# Patient Record
Sex: Female | Born: 1970 | Race: Black or African American | Hispanic: No | Smoking: Former smoker
Health system: Southern US, Community
[De-identification: ages and names within clinical notes are randomized; demographics above are authoritative.]

## PROBLEM LIST (undated history)

## (undated) DIAGNOSIS — E0590 Thyrotoxicosis, unspecified without thyrotoxic crisis or storm: Secondary | ICD-10-CM

## (undated) DIAGNOSIS — T8859XA Other complications of anesthesia, initial encounter: Secondary | ICD-10-CM

## (undated) DIAGNOSIS — T7840XA Allergy, unspecified, initial encounter: Secondary | ICD-10-CM

## (undated) DIAGNOSIS — E119 Type 2 diabetes mellitus without complications: Secondary | ICD-10-CM

## (undated) DIAGNOSIS — I10 Essential (primary) hypertension: Secondary | ICD-10-CM

## (undated) DIAGNOSIS — E669 Obesity, unspecified: Secondary | ICD-10-CM

## (undated) HISTORY — PX: TUBAL LIGATION: SHX77

## (undated) HISTORY — DX: Allergy, unspecified, initial encounter: T78.40XA

## (undated) HISTORY — PX: DILATION AND CURETTAGE OF UTERUS: SHX78

---

## 2010-11-19 ENCOUNTER — Emergency Department (HOSPITAL_COMMUNITY)
Admission: EM | Admit: 2010-11-19 | Discharge: 2010-11-19 | Disposition: A | Discharge status: Home / Self Care | Payer: Self-pay | Attending: Emergency Medicine | Admitting: Emergency Medicine

## 2010-11-19 DIAGNOSIS — E119 Type 2 diabetes mellitus without complications: Secondary | ICD-10-CM | POA: Insufficient documentation

## 2010-11-19 DIAGNOSIS — J45909 Unspecified asthma, uncomplicated: Secondary | ICD-10-CM | POA: Insufficient documentation

## 2010-11-19 DIAGNOSIS — I10 Essential (primary) hypertension: Secondary | ICD-10-CM | POA: Insufficient documentation

## 2010-11-19 DIAGNOSIS — H109 Unspecified conjunctivitis: Secondary | ICD-10-CM | POA: Insufficient documentation

## 2010-11-19 DIAGNOSIS — L2989 Other pruritus: Secondary | ICD-10-CM | POA: Insufficient documentation

## 2010-11-19 DIAGNOSIS — L298 Other pruritus: Secondary | ICD-10-CM | POA: Insufficient documentation

## 2010-11-19 DIAGNOSIS — H5789 Other specified disorders of eye and adnexa: Secondary | ICD-10-CM | POA: Insufficient documentation

## 2010-11-19 LAB — GLUCOSE, CAPILLARY: Glucose-Capillary: 93 mg/dL (ref 70–99)

## 2011-03-14 ENCOUNTER — Emergency Department (HOSPITAL_COMMUNITY)
Admission: EM | Admit: 2011-03-14 | Discharge: 2011-03-14 | Disposition: A | Discharge status: Home / Self Care | Payer: Self-pay | Attending: Emergency Medicine | Admitting: Emergency Medicine

## 2011-03-14 DIAGNOSIS — J029 Acute pharyngitis, unspecified: Secondary | ICD-10-CM | POA: Insufficient documentation

## 2011-03-14 DIAGNOSIS — R509 Fever, unspecified: Secondary | ICD-10-CM | POA: Insufficient documentation

## 2011-03-14 DIAGNOSIS — I10 Essential (primary) hypertension: Secondary | ICD-10-CM | POA: Insufficient documentation

## 2011-03-14 DIAGNOSIS — E119 Type 2 diabetes mellitus without complications: Secondary | ICD-10-CM | POA: Insufficient documentation

## 2011-03-14 DIAGNOSIS — J040 Acute laryngitis: Secondary | ICD-10-CM | POA: Insufficient documentation

## 2011-03-14 DIAGNOSIS — J45909 Unspecified asthma, uncomplicated: Secondary | ICD-10-CM | POA: Insufficient documentation

## 2011-03-14 DIAGNOSIS — R059 Cough, unspecified: Secondary | ICD-10-CM | POA: Insufficient documentation

## 2011-03-14 DIAGNOSIS — R05 Cough: Secondary | ICD-10-CM | POA: Insufficient documentation

## 2011-03-14 LAB — RAPID STREP SCREEN (MED CTR MEBANE ONLY): Streptococcus, Group A Screen (Direct): NEGATIVE

## 2011-03-18 ENCOUNTER — Emergency Department (HOSPITAL_COMMUNITY): Payer: Self-pay

## 2011-03-18 ENCOUNTER — Emergency Department (HOSPITAL_COMMUNITY)
Admission: EM | Admit: 2011-03-18 | Discharge: 2011-03-18 | Disposition: A | Discharge status: Home / Self Care | Payer: Self-pay | Attending: Emergency Medicine | Admitting: Emergency Medicine

## 2011-03-18 DIAGNOSIS — R599 Enlarged lymph nodes, unspecified: Secondary | ICD-10-CM | POA: Insufficient documentation

## 2011-03-18 DIAGNOSIS — R059 Cough, unspecified: Secondary | ICD-10-CM | POA: Insufficient documentation

## 2011-03-18 DIAGNOSIS — I10 Essential (primary) hypertension: Secondary | ICD-10-CM | POA: Insufficient documentation

## 2011-03-18 DIAGNOSIS — R1110 Vomiting, unspecified: Secondary | ICD-10-CM | POA: Insufficient documentation

## 2011-03-18 DIAGNOSIS — R5381 Other malaise: Secondary | ICD-10-CM | POA: Insufficient documentation

## 2011-03-18 DIAGNOSIS — J40 Bronchitis, not specified as acute or chronic: Secondary | ICD-10-CM | POA: Insufficient documentation

## 2011-03-18 DIAGNOSIS — Z79899 Other long term (current) drug therapy: Secondary | ICD-10-CM | POA: Insufficient documentation

## 2011-03-18 DIAGNOSIS — J029 Acute pharyngitis, unspecified: Secondary | ICD-10-CM | POA: Insufficient documentation

## 2011-03-18 DIAGNOSIS — R0989 Other specified symptoms and signs involving the circulatory and respiratory systems: Secondary | ICD-10-CM | POA: Insufficient documentation

## 2011-03-18 DIAGNOSIS — R0609 Other forms of dyspnea: Secondary | ICD-10-CM | POA: Insufficient documentation

## 2011-03-18 DIAGNOSIS — IMO0001 Reserved for inherently not codable concepts without codable children: Secondary | ICD-10-CM | POA: Insufficient documentation

## 2011-03-18 DIAGNOSIS — J351 Hypertrophy of tonsils: Secondary | ICD-10-CM | POA: Insufficient documentation

## 2011-03-18 DIAGNOSIS — R0789 Other chest pain: Secondary | ICD-10-CM | POA: Insufficient documentation

## 2011-03-18 DIAGNOSIS — R05 Cough: Secondary | ICD-10-CM | POA: Insufficient documentation

## 2011-03-18 DIAGNOSIS — J45909 Unspecified asthma, uncomplicated: Secondary | ICD-10-CM | POA: Insufficient documentation

## 2011-03-18 DIAGNOSIS — E119 Type 2 diabetes mellitus without complications: Secondary | ICD-10-CM | POA: Insufficient documentation

## 2011-03-18 DIAGNOSIS — R509 Fever, unspecified: Secondary | ICD-10-CM | POA: Insufficient documentation

## 2011-03-18 LAB — GLUCOSE, CAPILLARY: Glucose-Capillary: 111 mg/dL — ABNORMAL HIGH (ref 70–99)

## 2011-09-18 ENCOUNTER — Emergency Department (HOSPITAL_COMMUNITY): Payer: BC Managed Care – PPO

## 2011-09-18 ENCOUNTER — Emergency Department (HOSPITAL_COMMUNITY)
Admission: EM | Admit: 2011-09-18 | Discharge: 2011-09-19 | Disposition: A | Discharge status: Home / Self Care | Payer: BC Managed Care – PPO | Attending: Emergency Medicine | Admitting: Emergency Medicine

## 2011-09-18 ENCOUNTER — Other Ambulatory Visit: Payer: Self-pay

## 2011-09-18 ENCOUNTER — Encounter (HOSPITAL_COMMUNITY): Payer: Self-pay | Admitting: Emergency Medicine

## 2011-09-18 DIAGNOSIS — I10 Essential (primary) hypertension: Secondary | ICD-10-CM | POA: Insufficient documentation

## 2011-09-18 DIAGNOSIS — R5381 Other malaise: Secondary | ICD-10-CM | POA: Insufficient documentation

## 2011-09-18 DIAGNOSIS — E669 Obesity, unspecified: Secondary | ICD-10-CM | POA: Insufficient documentation

## 2011-09-18 DIAGNOSIS — R079 Chest pain, unspecified: Secondary | ICD-10-CM | POA: Insufficient documentation

## 2011-09-18 DIAGNOSIS — R110 Nausea: Secondary | ICD-10-CM | POA: Insufficient documentation

## 2011-09-18 DIAGNOSIS — J45909 Unspecified asthma, uncomplicated: Secondary | ICD-10-CM | POA: Insufficient documentation

## 2011-09-18 DIAGNOSIS — R1011 Right upper quadrant pain: Secondary | ICD-10-CM

## 2011-09-18 HISTORY — DX: Obesity, unspecified: E66.9

## 2011-09-18 HISTORY — DX: Essential (primary) hypertension: I10

## 2011-09-18 LAB — BASIC METABOLIC PANEL
CO2: 29 mEq/L (ref 19–32)
Calcium: 9.2 mg/dL (ref 8.4–10.5)
Creatinine, Ser: 0.82 mg/dL (ref 0.50–1.10)
GFR calc Af Amer: >90 mL/min (ref >90)
GFR calc non Af Amer: 90 mL/min — ABNORMAL LOW (ref >90)

## 2011-09-18 LAB — CBC
HCT: 40.7 % (ref 36.0–46.0)
MCH: 30.2 pg (ref 26.0–34.0)
MCHC: 34.2 g/dL (ref 30.0–36.0)
MCV: 88.5 fL (ref 78.0–100.0)
Platelets: 223 10*3/uL (ref 150–400)
RDW: 13.2 % (ref 11.5–15.5)

## 2011-09-18 LAB — HEPATIC FUNCTION PANEL
Albumin: 3.5 g/dL (ref 3.5–5.2)
Alkaline Phosphatase: 72 U/L (ref 39–117)
Bilirubin, Direct: 0.1 mg/dL (ref 0.0–0.3)
Total Bilirubin: 0.5 mg/dL (ref 0.3–1.2)

## 2011-09-18 LAB — POCT I-STAT TROPONIN I: Troponin i, poc: 0 ng/mL (ref 0.00–0.08)

## 2011-09-18 LAB — DIFFERENTIAL
Basophils Absolute: 0 10*3/uL (ref 0.0–0.1)
Basophils Relative: 0 % (ref 0–1)
Eosinophils Absolute: 0.1 10*3/uL (ref 0.0–0.7)
Eosinophils Relative: 1 % (ref 0–5)
Monocytes Absolute: 0.4 10*3/uL (ref 0.1–1.0)

## 2011-09-18 LAB — D-DIMER, QUANTITATIVE: D-Dimer, Quant: 0.35 ug/mL-FEU (ref 0.00–0.48)

## 2011-09-18 MED ORDER — POTASSIUM CHLORIDE CRYS ER 20 MEQ PO TBCR
40.0000 meq | EXTENDED_RELEASE_TABLET | Freq: Once | ORAL | Status: AC
  Administered 2011-09-18: 40 meq via ORAL
  Filled 2011-09-18: qty 2

## 2011-09-18 MED ORDER — SODIUM CHLORIDE 0.9 % IV BOLUS (SEPSIS)
1000.0000 mL | Freq: Once | INTRAVENOUS | Status: AC
  Administered 2011-09-18: 1000 mL via INTRAVENOUS

## 2011-09-18 MED ORDER — ONDANSETRON HCL 4 MG/2ML IJ SOLN
4.0000 mg | Freq: Once | INTRAMUSCULAR | Status: AC
  Administered 2011-09-18: 4 mg via INTRAVENOUS
  Filled 2011-09-18: qty 2

## 2011-09-18 MED ORDER — HYDROMORPHONE HCL PF 1 MG/ML IJ SOLN
1.0000 mg | Freq: Once | INTRAMUSCULAR | Status: AC
  Administered 2011-09-18: 1 mg via INTRAVENOUS
  Filled 2011-09-18: qty 1

## 2011-09-18 NOTE — ED Provider Notes (Signed)
History     CSN: [id number]  Arrival date & time 09/18/11  1947   First MD Initiated Contact with Patient 09/18/11 2109      Chief Complaint  Patient presents with  . Chest Pain    (Consider location/radiation/quality/duration/timing/severity/associated sxs/prior treatment) Patient is a 41 y.o. female presenting with abdominal pain. The history is provided by the patient. No language interpreter was used.  Abdominal Pain The primary symptoms of the illness include abdominal pain, fatigue and nausea. The primary symptoms of the illness do not include fever, shortness of breath, vomiting, diarrhea, dysuria, vaginal discharge or vaginal bleeding. The current episode started 2 days ago. The onset of the illness was gradual. The problem has been gradually worsening.  The abdominal pain began 2 days ago. The pain came on gradually. The abdominal pain has been gradually worsening since its onset. The abdominal pain is located in the RUQ. The abdominal pain radiates to the chest. The abdominal pain is exacerbated by deep breathing and eating.  Nausea began 2 days ago. The nausea is exacerbated by food.  The patient states that she believes she is currently not pregnant. The patient has not had a change in bowel habit. Symptoms associated with the illness do not include constipation, urgency, frequency or back pain.    Past Medical History  Diagnosis Date  . Asthma   . Hypertension   . Obesity     Past Surgical History  Procedure Date  . Tubal ligation     No family history on file.  History  Substance Use Topics  . Smoking status: Never Smoker   . Smokeless tobacco: Not on file  . Alcohol Use: Yes    OB History    Grav Para Term Preterm Abortions TAB SAB Ect Mult Living                  Review of Systems  Constitutional: Positive for appetite change and fatigue. Negative for fever and activity change.  HENT: Negative for congestion, sore throat, rhinorrhea, neck pain and  neck stiffness.   Respiratory: Negative for shortness of breath.   Gastrointestinal: Positive for nausea and abdominal pain. Negative for vomiting, diarrhea and constipation.  Genitourinary: Negative for dysuria, urgency, frequency, flank pain, vaginal bleeding and vaginal discharge.  Musculoskeletal: Negative for myalgias, back pain and arthralgias.  Neurological: Negative for light-headedness.  All other systems reviewed and are negative.    Allergies  Sulfa antibiotics; Aspirin; and Erythromycin  Home Medications  No current outpatient prescriptions on file.  BP 144/85  Pulse 74  Temp(Src) 99 F (37.2 C) (Oral)  Resp 16  SpO2 100%  Physical Exam  Nursing note and vitals reviewed. Constitutional: She is oriented to person, place, and time. She appears well-developed and well-nourished. No distress.  HENT:  Head: Normocephalic and atraumatic.  Mouth/Throat: Oropharynx is clear and moist. No oropharyngeal exudate.  Eyes: Conjunctivae and EOM are normal. Pupils are equal, round, and reactive to light.  Neck: Normal range of motion. Neck supple.  Cardiovascular: Normal rate, regular rhythm, normal heart sounds and intact distal pulses.  Exam reveals no gallop and no friction rub.   No murmur heard. Pulmonary/Chest: Effort normal and breath sounds normal. No respiratory distress. She exhibits no tenderness.  Abdominal: Soft. Bowel sounds are normal. There is tenderness (RUQ pain with murphy sign).  Musculoskeletal: Normal range of motion. She exhibits no tenderness.  Neurological: She is alert and oriented to person, place, and time. No cranial nerve deficit.  Skin: Skin is warm and dry. No rash noted.    ED Course  Procedures (including critical care time)   Date: 09/18/2011  Rate: 76  Rhythm: normal sinus rhythm  QRS Axis: normal  Intervals: normal  ST/T Wave abnormalities: normal  Conduction Disutrbances:none  Narrative Interpretation:   Old EKG Reviewed: none  available  Labs Reviewed  BASIC METABOLIC PANEL - Abnormal; Notable for the following:    Potassium 3.3 (*)    GFR calc non Af Amer 90 (*)    All other components within normal limits  CBC  DIFFERENTIAL  POCT I-STAT TROPONIN I  LIPASE, BLOOD  HEPATIC FUNCTION PANEL  D-DIMER, QUANTITATIVE  I-STAT TROPONIN I   Dg Chest 2 View  09/18/2011  *RADIOLOGY REPORT*  Clinical Data: Chest pain, pain under right breast for 2 days, history diabetes, hypertension  CHEST - 2 VIEW  Comparison: 03/17/2009  Findings: Upper-normal size of cardiac silhouette. Mediastinal contours and pulmonary vascularity normal. Mild chronic peribronchial thickening. No pulmonary infiltrate, pleural effusion, or pneumothorax. Minimal linear subsegmental atelectasis left base. Bones unremarkable.  IMPRESSION: Minimal subsegmental atelectasis left base.  Original Report Authenticated By: Lollie Marrow, M.D.     No diagnosis found.    MDM  Concern for biliary colic versus cholecystitis. LFTs are unremarkable. Chest normal white count. I have no concern about her chest pain as her d-dimer is negative. This is not a cardiac etiology. She received pain medication and IV fluids as well as antiemetics numerous department. We're awaiting the results of her right upper quadrant ultrasound. If there is evidence of cholecystitis she warrants surgery evaluation in the emergency department however if this is consistent with biliary colic she can follow up with general surgery and is to be dc home with pain and nausea meds.  Discussed with my colleague dr Ranae Palms who will follow the results and dispo the patient as discussed        Dayton Bailiff, MD 09/19/11 [phone]

## 2011-09-18 NOTE — ED Notes (Signed)
Report given and care endorsed to [place], [place].

## 2011-09-18 NOTE — ED Notes (Signed)
PT. REPORTS RIGHT CHEST PAIN WITH COUGH AND VOMITTING FOR 2 DAYS . DENIES SOB OR DIAPHORESIS.

## 2011-09-18 NOTE — ED Notes (Signed)
EDP at [facility]. pt alert, NAD, calm, interactive, skin W&D, resps e/u, EKG reviewed, NSR on monitor, VSS.

## 2011-09-18 NOTE — ED Notes (Signed)
Pt up to bathroom. Gait steady, pt reports very mild lightheadedness.  She denies N/V and rates her pain at 2/10,. "much better".

## 2011-09-19 ENCOUNTER — Emergency Department (HOSPITAL_COMMUNITY): Payer: BC Managed Care – PPO

## 2011-09-19 MED ORDER — HYDROCODONE-ACETAMINOPHEN 5-500 MG PO TABS: 1.0000 | ORAL_TABLET | Freq: Four times a day (QID) | ORAL | Status: AC | PRN

## 2011-09-19 MED ORDER — ONDANSETRON HCL 4 MG PO TABS: 4.0000 mg | ORAL_TABLET | Freq: Four times a day (QID) | ORAL | Status: AC

## 2011-09-19 NOTE — Discharge Instructions (Signed)
Abdominal Pain Abdominal pain can be caused by many things. Your caregiver decides the seriousness of your pain by an examination and possibly blood tests and X-rays. Many cases can be observed and treated at home. Most abdominal pain is not caused by a disease and will probably improve without treatment. However, in many cases, more time must pass before a clear cause of the pain can be found. Before that point, it may not be known if you need more testing, or if hospitalization or surgery is needed. HOME CARE INSTRUCTIONS   Do not take laxatives unless directed by your caregiver.   Take pain medicine only as directed by your caregiver.   Only take over-the-counter or prescription medicines for pain, discomfort, or fever as directed by your caregiver.   Try a clear liquid diet (broth, tea, or water) for as long as directed by your caregiver. Slowly move to a bland diet as tolerated.  SEEK IMMEDIATE MEDICAL CARE IF:   The pain does not go away.   You have a fever.   You keep throwing up (vomiting).   The pain is felt only in portions of the abdomen. Pain in the right side could possibly be appendicitis. In an adult, pain in the left lower portion of the abdomen could be colitis or diverticulitis.   You pass bloody or black tarry stools.  MAKE SURE YOU:   Understand these instructions.   Will watch your condition.   Will get help right away if you are not doing well or get worse.  Document Released: 05/18/2005 Document Revised: 04/20/2011 Document Reviewed: 03/26/2008 [employer] Patient Information 2012 [employer]. 

## 2011-09-19 NOTE — ED Provider Notes (Signed)
U/S neg. VS remained stable. Mild RUQ pain to palp. No R/G. Advised to return for worsening symptoms, fever, or concerns. Will d/c home with symptomatic treatment as prev discussed with Dr Morton Stall, MD 09/19/11 [phone]

## 2012-03-24 ENCOUNTER — Encounter (HOSPITAL_COMMUNITY): Payer: Self-pay | Admitting: *Deleted

## 2012-03-24 ENCOUNTER — Emergency Department (HOSPITAL_COMMUNITY)
Admission: EM | Admit: 2012-03-24 | Discharge: 2012-03-24 | Disposition: A | Discharge status: Home / Self Care | Payer: BC Managed Care – PPO | Attending: Emergency Medicine | Admitting: Emergency Medicine

## 2012-03-24 ENCOUNTER — Emergency Department (HOSPITAL_COMMUNITY): Payer: BC Managed Care – PPO

## 2012-03-24 DIAGNOSIS — J45901 Unspecified asthma with (acute) exacerbation: Secondary | ICD-10-CM

## 2012-03-24 DIAGNOSIS — I10 Essential (primary) hypertension: Secondary | ICD-10-CM | POA: Insufficient documentation

## 2012-03-24 DIAGNOSIS — R0602 Shortness of breath: Secondary | ICD-10-CM

## 2012-03-24 DIAGNOSIS — R112 Nausea with vomiting, unspecified: Secondary | ICD-10-CM

## 2012-03-24 DIAGNOSIS — E669 Obesity, unspecified: Secondary | ICD-10-CM | POA: Insufficient documentation

## 2012-03-24 DIAGNOSIS — J45909 Unspecified asthma, uncomplicated: Secondary | ICD-10-CM | POA: Insufficient documentation

## 2012-03-24 MED ORDER — ALBUTEROL SULFATE HFA 108 (90 BASE) MCG/ACT IN AERS
1.0000 | INHALATION_SPRAY | Freq: Four times a day (QID) | RESPIRATORY_TRACT | Status: DC
  Filled 2012-03-24: qty 6.7

## 2012-03-24 MED ORDER — ALBUTEROL SULFATE (5 MG/ML) 0.5% IN NEBU
2.5000 mg | INHALATION_SOLUTION | RESPIRATORY_TRACT | Status: DC
  Administered 2012-03-24: 2.5 mg via RESPIRATORY_TRACT
  Filled 2012-03-24: qty 0.5

## 2012-03-24 MED ORDER — METHYLPREDNISOLONE SODIUM SUCC 125 MG IJ SOLR
60.0000 mg | Freq: Once | INTRAMUSCULAR | Status: AC
  Administered 2012-03-24: 60 mg via INTRAVENOUS
  Filled 2012-03-24: qty 2

## 2012-03-24 MED ORDER — PREDNISONE 20 MG PO TABS: ORAL_TABLET | ORAL | Status: AC

## 2012-03-24 MED ORDER — IPRATROPIUM BROMIDE 0.02 % IN SOLN: 0.5000 mg | Freq: Once | RESPIRATORY_TRACT | Status: DC

## 2012-03-24 MED ORDER — ALBUTEROL SULFATE (5 MG/ML) 0.5% IN NEBU: 2.5000 mg | INHALATION_SOLUTION | Freq: Once | RESPIRATORY_TRACT | Status: DC

## 2012-03-24 MED ORDER — IPRATROPIUM BROMIDE 0.02 % IN SOLN
0.5000 mg | RESPIRATORY_TRACT | Status: DC
  Administered 2012-03-24: 0.5 mg via RESPIRATORY_TRACT
  Filled 2012-03-24: qty 2.5

## 2012-03-24 MED ORDER — SODIUM CHLORIDE 0.9 % IV BOLUS (SEPSIS)
1000.0000 mL | Freq: Once | INTRAVENOUS | Status: AC
  Administered 2012-03-24: 1000 mL via INTRAVENOUS

## 2012-03-24 MED ORDER — ONDANSETRON HCL 4 MG/2ML IJ SOLN
4.0000 mg | Freq: Once | INTRAMUSCULAR | Status: AC
  Administered 2012-03-24: 4 mg via INTRAVENOUS
  Filled 2012-03-24: qty 2

## 2012-03-24 MED ORDER — ALBUTEROL SULFATE HFA 108 (90 BASE) MCG/ACT IN AERS: 1.0000 | INHALATION_SPRAY | Freq: Four times a day (QID) | RESPIRATORY_TRACT | Status: DC | PRN

## 2012-03-24 NOTE — ED Provider Notes (Signed)
History     CSN: [id number]  Arrival date & time 03/24/12  1101   First MD Initiated Contact with Patient 03/24/12 1130      Chief Complaint  Patient presents with  . Asthma  . Emesis    (Consider location/radiation/quality/duration/timing/severity/associated sxs/prior treatment) HPI Comments: 41 y/o female with pmhx of asthma presents with shortness of breath and nausea since last night. Tried using her nebulizer without relief. She is not on any medications for asthma, and has not been admitted for an asthma attack in about 3 years. No doctor is currently following her asthma. Admits to associated chest tightness, wheezing, cough, and vomiting. Earlier yesterday she was feeling fine. She only has 1-2 asthma attacks per year and this is similar to her previous attacks. Denies any fever, chills, or recent illness.  Patient is a 41 y.o. female presenting with asthma and vomiting. The history is provided by the patient. The history is limited by the condition of the patient.  Asthma Associated symptoms include coughing, nausea and vomiting. Pertinent negatives include no chest pain, chills or fever.  Emesis  Associated symptoms include cough. Pertinent negatives include no chills and no fever.    Past Medical History  Diagnosis Date  . Asthma   . Hypertension   . Obesity     Past Surgical History  Procedure Date  . Tubal ligation     History reviewed. No pertinent family history.  History  Substance Use Topics  . Smoking status: Never Smoker   . Smokeless tobacco: Not on file  . Alcohol Use: Yes    OB History    Grav Para Term Preterm Abortions TAB SAB Ect Mult Living                  Review of Systems  Constitutional: Negative for fever and chills.  Respiratory: Positive for cough, chest tightness, shortness of breath and wheezing.   Cardiovascular: Negative for chest pain.  Gastrointestinal: Positive for nausea and vomiting.    Allergies  Sulfa antibiotics;  Aspirin; and Erythromycin  Home Medications   Current Outpatient Rx  Name Route Sig Dispense Refill  . ALBUTEROL SULFATE HFA 108 (90 BASE) MCG/ACT IN AERS Inhalation Inhale 2 puffs into the lungs every 6 (six) hours as needed. For shortness of breath    . GUAIFENESIN 100 MG/5ML PO SYRP Oral Take 200 mg by mouth 3 (three) times daily as needed. For cough    . NYQUIL PO Oral Take 1 capsule by mouth 3 (three) times daily.      BP 184/96  Pulse 83  Temp 98.3 F (36.8 C) (Oral)  Resp 20  SpO2 99%  Physical Exam  Nursing note and vitals reviewed. Constitutional: She is oriented to person, place, and time. She appears well-developed and well-nourished.       Actively coughing, vomiting up phlegm, and speaking with a wheezy voice. Coughing so much she is causing herself to become diaphoretic.  HENT:  Head: Normocephalic and atraumatic.  Nose: Nose normal.  Mouth/Throat: Oropharynx is clear and moist and mucous membranes are normal.  Eyes: Conjunctivae are normal.  Neck: Trachea normal. Neck supple.  Cardiovascular: Regular rhythm and normal heart sounds.   Pulmonary/Chest: She has wheezes (scattered expiratory). She has no rhonchi. She has no rales.       Labored breathing  Abdominal: Soft. Normal appearance and bowel sounds are normal. There is no tenderness.  Neurological: She is alert and oriented to person, place, and time.  Skin: Skin is warm. No rash noted. She is diaphoretic. No cyanosis.  Psychiatric: Her behavior is normal. Her mood appears anxious.    ED Course  Procedures (including critical care time)  Labs Reviewed - No data to display Dg Chest 2 View  03/24/2012  *RADIOLOGY REPORT*  Clinical Data: Asthma.  Emesis  CHEST - 2 VIEW  Comparison: 09/18/2011  Findings: The heart size and mediastinal contours are within normal limits.  Both lungs are clear.  The visualized skeletal structures are unremarkable.  IMPRESSION: Negative exam.  Original Report Authenticated By:  Rosealee Albee, M.D.     1. Asthma attack   2. Shortness of breath   3. Nausea & vomiting       MDM  41 y/o asthmatic female presenting with shortness of breath and nausea. According to patient this is similar to her attacks in the past. CXR normal. Will give IV fluids, zofran, solumedrol, duo-neb and reasess. 12:31 PM Patient feeling much better after duo-neb, zofran, and solumedrol. No longer coughing, SOB, or vomiting. Chest tightness has subsided. Lungs still with scattered wheezes but with marked clinical improvement. Patient able to walk around pod A of the ED with O2 sat between 95-100%. No exacerbation of symptoms. Will discharge with prednisone, albuterol, and have her f/u with a PCP.      Trevor Mace, PA-C 03/24/12 1239  Trevor Mace, PA-C 03/24/12 1240

## 2012-03-24 NOTE — ED Notes (Signed)
Reports waking up this am with sob, productive cough, vomiting, fever/chills.spo2 98% on room air at triage.

## 2012-03-24 NOTE — ED Provider Notes (Signed)
Medical screening examination/treatment/procedure(s) were performed by non-physician practitioner and as supervising physician I was immediately available for consultation/collaboration.   Richardean Canal, MD 03/24/12 1310

## 2013-05-26 ENCOUNTER — Emergency Department (HOSPITAL_COMMUNITY): Payer: Self-pay

## 2013-05-26 ENCOUNTER — Emergency Department (HOSPITAL_COMMUNITY)
Admission: EM | Admit: 2013-05-26 | Discharge: 2013-05-26 | Disposition: A | Discharge status: Home / Self Care | Payer: Self-pay | Attending: Emergency Medicine | Admitting: Emergency Medicine

## 2013-05-26 ENCOUNTER — Encounter (HOSPITAL_COMMUNITY): Payer: Self-pay | Admitting: *Deleted

## 2013-05-26 DIAGNOSIS — R509 Fever, unspecified: Secondary | ICD-10-CM | POA: Insufficient documentation

## 2013-05-26 DIAGNOSIS — Z79899 Other long term (current) drug therapy: Secondary | ICD-10-CM | POA: Insufficient documentation

## 2013-05-26 DIAGNOSIS — J45901 Unspecified asthma with (acute) exacerbation: Secondary | ICD-10-CM | POA: Insufficient documentation

## 2013-05-26 DIAGNOSIS — I10 Essential (primary) hypertension: Secondary | ICD-10-CM | POA: Insufficient documentation

## 2013-05-26 DIAGNOSIS — E669 Obesity, unspecified: Secondary | ICD-10-CM | POA: Insufficient documentation

## 2013-05-26 DIAGNOSIS — R0789 Other chest pain: Secondary | ICD-10-CM | POA: Insufficient documentation

## 2013-05-26 LAB — CBC WITH DIFFERENTIAL/PLATELET
Basophils Absolute: 0 10*3/uL (ref 0.0–0.1)
Eosinophils Relative: 4 % (ref 0–5)
Lymphocytes Relative: 35 % (ref 12–46)
MCV: 87.1 fL (ref 78.0–100.0)
Neutro Abs: 3.7 10*3/uL (ref 1.7–7.7)
Neutrophils Relative %: 53 % (ref 43–77)
Platelets: 240 10*3/uL (ref 150–400)
RBC: 5.21 MIL/uL — ABNORMAL HIGH (ref 3.87–5.11)
RDW: 12.7 % (ref 11.5–15.5)
WBC: 7 10*3/uL (ref 4.0–10.5)

## 2013-05-26 LAB — URINALYSIS, ROUTINE W REFLEX MICROSCOPIC
Bilirubin Urine: NEGATIVE
Glucose, UA: NEGATIVE mg/dL
Ketones, ur: 15 mg/dL — AB
Leukocytes, UA: NEGATIVE
Nitrite: NEGATIVE
Protein, ur: NEGATIVE mg/dL
Specific Gravity, Urine: 1.012 (ref 1.005–1.030)
Urobilinogen, UA: 0.2 mg/dL (ref 0.0–1.0)
pH: 7.5 (ref 5.0–8.0)

## 2013-05-26 LAB — COMPREHENSIVE METABOLIC PANEL
ALT: 18 U/L (ref 0–35)
AST: 22 U/L (ref 0–37)
Albumin: 3.9 g/dL (ref 3.5–5.2)
Alkaline Phosphatase: 96 U/L (ref 39–117)
BUN: 7 mg/dL (ref 6–23)
CO2: 21 mEq/L (ref 19–32)
Calcium: 9.8 mg/dL (ref 8.4–10.5)
Chloride: 100 mEq/L (ref 96–112)
Creatinine, Ser: 0.73 mg/dL (ref 0.50–1.10)
GFR calc Af Amer: >90 mL/min (ref >90)
GFR calc non Af Amer: >90 mL/min (ref >90)
Glucose, Bld: 99 mg/dL (ref 70–99)
Potassium: 4.3 mEq/L (ref 3.5–5.1)
Sodium: 136 mEq/L (ref 135–145)
Total Bilirubin: 1.5 mg/dL — ABNORMAL HIGH (ref 0.3–1.2)
Total Protein: 8.1 g/dL (ref 6.0–8.3)

## 2013-05-26 LAB — URINE MICROSCOPIC-ADD ON

## 2013-05-26 LAB — POCT I-STAT TROPONIN I

## 2013-05-26 LAB — GLUCOSE, CAPILLARY: Glucose-Capillary: 112 mg/dL — ABNORMAL HIGH (ref 70–99)

## 2013-05-26 MED ORDER — ONDANSETRON 4 MG PO TBDP
4.0000 mg | ORAL_TABLET | Freq: Once | ORAL | Status: AC
  Administered 2013-05-26: 4 mg via ORAL
  Filled 2013-05-26: qty 1

## 2013-05-26 MED ORDER — ALBUTEROL (5 MG/ML) CONTINUOUS INHALATION SOLN
15.0000 mg/h | INHALATION_SOLUTION | Freq: Once | RESPIRATORY_TRACT | Status: AC
  Administered 2013-05-26: 15 mg/h via RESPIRATORY_TRACT
  Filled 2013-05-26: qty 20

## 2013-05-26 MED ORDER — ALBUTEROL SULFATE (5 MG/ML) 0.5% IN NEBU: 5.0000 mg | INHALATION_SOLUTION | RESPIRATORY_TRACT | Status: DC | PRN

## 2013-05-26 MED ORDER — ALBUTEROL SULFATE (5 MG/ML) 0.5% IN NEBU
5.0000 mg | INHALATION_SOLUTION | Freq: Once | RESPIRATORY_TRACT | Status: AC
  Administered 2013-05-26: 5 mg via RESPIRATORY_TRACT
  Filled 2013-05-26: qty 1

## 2013-05-26 MED ORDER — PREDNISONE 20 MG PO TABS: 40.0000 mg | ORAL_TABLET | Freq: Every day | ORAL | Status: DC

## 2013-05-26 MED ORDER — IPRATROPIUM BROMIDE 0.02 % IN SOLN
0.5000 mg | Freq: Once | RESPIRATORY_TRACT | Status: AC
  Administered 2013-05-26: 0.5 mg via RESPIRATORY_TRACT
  Filled 2013-05-26: qty 2.5

## 2013-05-26 MED ORDER — PREDNISONE 20 MG PO TABS
60.0000 mg | ORAL_TABLET | Freq: Once | ORAL | Status: AC
  Administered 2013-05-26: 60 mg via ORAL
  Filled 2013-05-26: qty 3

## 2013-05-26 MED ORDER — ALBUTEROL SULFATE HFA 108 (90 BASE) MCG/ACT IN AERS
2.0000 | INHALATION_SPRAY | RESPIRATORY_TRACT | Status: DC | PRN
  Administered 2013-05-26: 2 via RESPIRATORY_TRACT
  Filled 2013-05-26: qty 6.7

## 2013-05-26 NOTE — ED Provider Notes (Signed)
CSN: [id number]     Arrival date & time 05/26/13  6578 History   First MD Initiated Contact with Patient 05/26/13 [phone]     Chief Complaint  Patient presents with  . Asthma  . Shortness of Breath   (Consider location/radiation/quality/duration/timing/severity/associated sxs/prior Treatment) HPI Comments: Patient with history of asthma, bronchitis, most recent hospitalization approximately 3 years ago, no previous intubations as an adult -- presents with complaint of wheezing and shortness of breath that is becoming progressively worse over the past 2 days. Patient has been using her albuterol inhaler and nebulizer machine without relief. She reports fever to 103F at home. She also complains of right chest pressure like someone is standing on her chest. This has been ongoing since Friday. Patient states that she has a history of diabetes, high blood pressure however she has not been taking medications for these for several years after a move. She denies abdominal pain or urinary symptoms. Onset of symptoms gradual. Course is gradually worsening. Nothing makes symptoms better or worse.  Patient is a 42 y.o. female presenting with asthma and shortness of breath. The history is provided by the patient.  Asthma Associated symptoms include coughing and a fever. Pertinent negatives include no abdominal pain, chest pain, chills, headaches, myalgias, nausea, rash, sore throat or vomiting.  Shortness of Breath Associated symptoms: cough, fever and wheezing   Associated symptoms: no abdominal pain, no chest pain, no headaches, no rash, no sore throat and no vomiting     Past Medical History  Diagnosis Date  . Asthma   . Hypertension   . Obesity    Past Surgical History  Procedure Laterality Date  . Tubal ligation     History reviewed. No pertinent family history. History  Substance Use Topics  . Smoking status: Never Smoker   . Smokeless tobacco: Not on file  . Alcohol Use: Yes   OB History    Grav Para Term Preterm Abortions TAB SAB Ect Mult Living                 Review of Systems  Constitutional: Positive for fever. Negative for chills.  HENT: Negative for sore throat and rhinorrhea.   Eyes: Negative for redness.  Respiratory: Positive for cough, chest tightness, shortness of breath and wheezing.   Cardiovascular: Negative for chest pain.  Gastrointestinal: Negative for nausea, vomiting, abdominal pain and diarrhea.  Genitourinary: Negative for dysuria.  Musculoskeletal: Negative for myalgias.  Skin: Negative for rash.  Neurological: Negative for headaches.    Allergies  Sulfa antibiotics; Aspirin; and Erythromycin  Home Medications   Current Outpatient Rx  Name  Route  Sig  Dispense  Refill  . albuterol (PROVENTIL HFA;VENTOLIN HFA) 108 (90 BASE) MCG/ACT inhaler   Inhalation   Inhale 2 puffs into the lungs every 6 (six) hours as needed. For shortness of breath         . EXPIRED: albuterol (PROVENTIL HFA;VENTOLIN HFA) 108 (90 BASE) MCG/ACT inhaler   Inhalation   Inhale 1-2 puffs into the lungs every 6 (six) hours as needed for wheezing.   1 Inhaler   0   . guaifenesin (ROBITUSSIN) 100 MG/5ML syrup   Oral   Take 200 mg by mouth 3 (three) times daily as needed. For cough         . Pseudoeph-Doxylamine-DM-APAP (NYQUIL PO)   Oral   Take 1 capsule by mouth 3 (three) times daily.          BP 161/119  Pulse 66  Temp(Src) 99.1 F (37.3 C) (Oral)  Resp 18  SpO2 100% Physical Exam  Nursing note and vitals reviewed. Constitutional: She appears well-developed and well-nourished.  Patient tearful  HENT:  Head: Normocephalic and atraumatic.  Right Ear: External ear normal.  Left Ear: External ear normal.  Nose: Nose normal.  Mouth/Throat: Oropharynx is clear and moist.  Eyes: Conjunctivae are normal. Right eye exhibits no discharge. Left eye exhibits no discharge.  Neck: Normal range of motion. Neck supple.  Cardiovascular: Normal rate, regular  rhythm and normal heart sounds.   Pulmonary/Chest: Effort normal. No respiratory distress. She has wheezes (Moderate expiratory all fields). She has no rales.  Abdominal: Soft. Bowel sounds are normal. There is no tenderness. There is no rebound and no guarding.  Neurological: She is alert.  Skin: Skin is warm and dry.  Psychiatric: She has a normal mood and affect.    ED Course  Procedures (including critical care time) Labs Review Labs Reviewed  CBC WITH DIFFERENTIAL - Abnormal; Notable for the following:    RBC 5.21 (*)    Hemoglobin 16.3 (*)    All other components within normal limits  COMPREHENSIVE METABOLIC PANEL - Abnormal; Notable for the following:    Total Bilirubin 1.5 (*)    All other components within normal limits  URINALYSIS, ROUTINE W REFLEX MICROSCOPIC - Abnormal; Notable for the following:    Hgb urine dipstick TRACE (*)    Ketones, ur 15 (*)    All other components within normal limits  GLUCOSE, CAPILLARY - Abnormal; Notable for the following:    Glucose-Capillary 112 (*)    All other components within normal limits  URINE MICROSCOPIC-ADD ON - Abnormal; Notable for the following:    Squamous Epithelial / LPF FEW (*)    All other components within normal limits  POCT I-STAT TROPONIN I   Imaging Review Dg Chest 2 View  05/26/2013   CLINICAL DATA:  Shortness of breath, asthma and fever.  Cough.  EXAM: CHEST  2 VIEW  COMPARISON:  03/24/2012 and 03/18/2011  FINDINGS: The heart size and mediastinal contours are within normal limits. Both lungs are clear. The visualized skeletal structures are unremarkable.  IMPRESSION: No active cardiopulmonary disease.   Electronically Signed   By: Elberta Fortis M.D.   On: 05/26/2013 09:50    9:10 AM Patient seen and examined. Work-up initiated. Medications ordered.   Vital signs reviewed and are as follows: Filed Vitals:   05/26/13 0905  BP: 161/119  Pulse:   Temp:   Resp:   BP 161/119  Pulse 66  Temp(Src) 99.1 F (37.3  C) (Oral)  Resp 18  SpO2 100%   Date: 05/26/2013  Rate: 64  Rhythm: normal sinus rhythm  QRS Axis: normal  Intervals: normal  ST/T Wave abnormalities: normal  Conduction Disutrbances:none  Narrative Interpretation:   Old EKG Reviewed: unchanged from 08/2011  10:39 AM Pt subjectively improved. Wheezing slightly louder, I think because she now has better air movement.   After our long nebulizer treatment, patient with full resolution of wheezing on exam. She states that she is feeling much better and that she is comfortable with discharge home. Oxygen saturation is 100% on room air. Patient will be discharged with remainder of course of prednisone, albuterol.   Patient urged to return with worsening symptoms or other concerns. Patient verbalized understanding and agrees with plan.    MDM   1. Asthma exacerbation    Patient with asthma exacerbation/bronchospasm. Improved in the emergency department  with oral steroids, albuterol treatments. No pneumonia on chest x-ray. Labs are otherwise concerning. Do not feel patient needs admission given resolution of symptoms. She appears well, nontoxic. No concern for ACS as cause of chest tightness given normal troponin, normal EKG, resolution of tightness with improvement in wheezing. No concern for PE.    Renne Crigler, PA-C 05/26/13 1539

## 2013-05-26 NOTE — ED Provider Notes (Signed)
Medical screening examination/treatment/procedure(s) were performed by non-physician practitioner and as supervising physician I was immediately available for consultation/collaboration.  Jasia Hiltunen L Suann Klier, MD 05/26/13 1716 

## 2013-05-26 NOTE — ED Notes (Signed)
CBG checked 112

## 2013-05-26 NOTE — ED Notes (Signed)
Reports having asthma and bronchitis, started feeling bad on Friday and no relief with inhalers. Expiratory wheezing noted on arrival, spo2 100%. Pt very anxious and crying

## 2013-05-27 NOTE — Progress Notes (Signed)
Received call from [facility], pharmacist with [facility] at [phone] regarding discharge prescription for albuterol nebulizers. States, "we do not sell saline here to mix with the medication but we have prediluted albuterol." Angela Burton verified change to prediluted. Instructions given to pharmacist. No further needs identified.

## 2013-09-19 ENCOUNTER — Emergency Department (HOSPITAL_COMMUNITY)
Admission: EM | Admit: 2013-09-19 | Discharge: 2013-09-19 | Disposition: A | Discharge status: Home / Self Care | Payer: BC Managed Care – PPO | Attending: Emergency Medicine | Admitting: Emergency Medicine

## 2013-09-19 ENCOUNTER — Encounter (HOSPITAL_COMMUNITY): Payer: Self-pay | Admitting: Emergency Medicine

## 2013-09-19 DIAGNOSIS — I10 Essential (primary) hypertension: Secondary | ICD-10-CM | POA: Insufficient documentation

## 2013-09-19 DIAGNOSIS — Z87891 Personal history of nicotine dependence: Secondary | ICD-10-CM | POA: Insufficient documentation

## 2013-09-19 DIAGNOSIS — Y990 Civilian activity done for income or pay: Secondary | ICD-10-CM | POA: Insufficient documentation

## 2013-09-19 DIAGNOSIS — Y9289 Other specified places as the place of occurrence of the external cause: Secondary | ICD-10-CM | POA: Insufficient documentation

## 2013-09-19 DIAGNOSIS — J45909 Unspecified asthma, uncomplicated: Secondary | ICD-10-CM | POA: Insufficient documentation

## 2013-09-19 DIAGNOSIS — E669 Obesity, unspecified: Secondary | ICD-10-CM | POA: Insufficient documentation

## 2013-09-19 DIAGNOSIS — Z79899 Other long term (current) drug therapy: Secondary | ICD-10-CM | POA: Insufficient documentation

## 2013-09-19 DIAGNOSIS — X58XXXA Exposure to other specified factors, initial encounter: Secondary | ICD-10-CM | POA: Insufficient documentation

## 2013-09-19 DIAGNOSIS — Y9389 Activity, other specified: Secondary | ICD-10-CM | POA: Insufficient documentation

## 2013-09-19 DIAGNOSIS — M5430 Sciatica, unspecified side: Secondary | ICD-10-CM | POA: Insufficient documentation

## 2013-09-19 DIAGNOSIS — IMO0002 Reserved for concepts with insufficient information to code with codable children: Secondary | ICD-10-CM | POA: Insufficient documentation

## 2013-09-19 DIAGNOSIS — M5431 Sciatica, right side: Secondary | ICD-10-CM

## 2013-09-19 MED ORDER — DIAZEPAM 5 MG PO TABS: 5.0000 mg | ORAL_TABLET | Freq: Two times a day (BID) | ORAL | Status: DC | PRN

## 2013-09-19 MED ORDER — IBUPROFEN 800 MG PO TABS: 800.0000 mg | ORAL_TABLET | Freq: Four times a day (QID) | ORAL | Status: DC | PRN

## 2013-09-19 MED ORDER — IBUPROFEN 400 MG PO TABS
800.0000 mg | ORAL_TABLET | Freq: Once | ORAL | Status: AC
  Administered 2013-09-19: 800 mg via ORAL
  Filled 2013-09-19: qty 2

## 2013-09-19 MED ORDER — DIAZEPAM 5 MG PO TABS
5.0000 mg | ORAL_TABLET | Freq: Once | ORAL | Status: AC
  Administered 2013-09-19: 5 mg via ORAL
  Filled 2013-09-19: qty 1

## 2013-09-19 NOTE — ED Notes (Signed)
Pt from home, c/o back pain after slipping on a lemon at work last night. Pt tearful. States she kept working pain got worse.

## 2013-09-19 NOTE — ED Provider Notes (Signed)
Medical screening examination/treatment/procedure(s) were performed by non-physician practitioner and as supervising physician I was immediately available for consultation/collaboration.  EKG Interpretation   None         Aoife Bold B. Karle Starch, MD 09/19/13 1326

## 2013-09-19 NOTE — ED Provider Notes (Signed)
CSN: [id number]     Arrival date & time 09/19/13  0912 History   First MD Initiated Contact with Patient 09/19/13 [phone]    This chart was scribed for Fransisco Beau PA-C, a non-physician practitioner working with No att. providers found by Denice Bors, ED Scribe. This patient was seen in room TR06C/TR06C and the patient's care was started at 9:30 AM      Chief Complaint  Patient presents with  . Back Pain   (Consider location/radiation/quality/duration/timing/severity/associated sxs/prior Treatment) The history is provided by the patient. No language interpreter was used.   HPI Comments: Angela Burton is a 43 y.o. 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 Describes pain as sharp, shooting, and radiating down posterior right leg. Reports trying a heating pad and soaking in bath tub with no relief of symptoms. Reports back is exacerbated by walking and sitting. Denies associated head injury, LOC, and neck pain. Denies urinary or fecal incontinence, urinary retention, perineal/saddle paresthesias, fever, PMHx of cancer, and IV drug use. Denies PMHx of back surgeries.       Past Medical History  Diagnosis Date  . Asthma   . Hypertension   . Obesity    Past Surgical History  Procedure Laterality Date  . Tubal ligation     History reviewed. No pertinent family history. History  Substance Use Topics  . Smoking status: Former Research scientist (life sciences)  . Smokeless tobacco: Not on file  . Alcohol Use: Yes   OB History   Grav Para Term Preterm Abortions TAB SAB Ect Mult Living                 Review of Systems  Constitutional: Negative for fever.  Musculoskeletal: Positive for back pain. Negative for neck pain.  Neurological: Negative for numbness.  Psychiatric/Behavioral: Negative for confusion.  All other systems reviewed and are negative.    Allergies   Sulfa antibiotics; Aspirin; and Erythromycin  Home Medications   Current Outpatient Rx  Name  Route  Sig  Dispense  Refill  . albuterol (PROVENTIL HFA;VENTOLIN HFA) 108 (90 BASE) MCG/ACT inhaler   Inhalation   Inhale 1-2 puffs into the lungs every 6 (six) hours as needed for wheezing or shortness of breath.         Marland Kitchen albuterol (PROVENTIL) (5 MG/ML) 0.5% nebulizer solution   Nebulization   Take 1 mL (5 mg total) by nebulization every 4 (four) hours as needed for wheezing or shortness of breath.   20 mL   12   . diazepam (VALIUM) 5 MG tablet   Oral   Take 1 tablet (5 mg total) by mouth every 12 (twelve) hours as needed for muscle spasms.   12 tablet   0   . ibuprofen (ADVIL,MOTRIN) 800 MG tablet   Oral   Take 1 tablet (800 mg total) by mouth every 6 (six) hours as needed for mild pain or moderate pain.   21 tablet   0    BP 181/85  Temp(Src) 98.6 F (37 C) (Oral)  Resp 18  Ht 5\' 6"  (1.676 m)  Wt 240 lb (108.863 kg)  BMI 38.76 kg/m2  SpO2 98% Physical Exam  Constitutional: She is oriented to person, place, and time. She appears well-developed and well-nourished. No distress.  HENT:  Head: Normocephalic and atraumatic.  Right Ear: External ear normal.  Left Ear: External ear normal.  Nose: Nose  normal.  Mouth/Throat: Oropharynx is clear and moist. No oropharyngeal exudate.  Eyes: Conjunctivae and EOM are normal. Pupils are equal, round, and reactive to light.  Neck: Normal range of motion. Neck supple.  Cardiovascular: Normal rate, regular rhythm, normal heart sounds and intact distal pulses.   Pulmonary/Chest: Effort normal and breath sounds normal. No respiratory distress.  Abdominal: Soft. There is no tenderness.  Musculoskeletal:       Back:  No midline C-spine, T-spine, or L-spine tenderness with no step-offs or deformities noted    Neurological: She is alert and oriented to person, place, and time. She has normal strength. No cranial nerve deficit or  sensory deficit. Gait normal. GCS eye subscore is 4. GCS verbal subscore is 5. GCS motor subscore is 6.  No pronator drift. Bilateral heel-knee-shin intact.  Skin: Skin is warm and dry. She is not diaphoretic.    ED Course  Procedures (including critical care time) Medications  ibuprofen (ADVIL,MOTRIN) tablet 800 mg (800 mg Oral Given 09/19/13 0945)  diazepam (VALIUM) tablet 5 mg (5 mg Oral Given 09/19/13 0945)     COORDINATION OF CARE:  Nursing notes reviewed. Vital signs reviewed. Initial pt interview and examination performed.   9:40 AM-Discussed treatment plan with pt at bedside. Pt agrees with plan.   Treatment plan initiated: Medications  ibuprofen (ADVIL,MOTRIN) tablet 800 mg (800 mg Oral Given 09/19/13 0945)  diazepam (VALIUM) tablet 5 mg (5 mg Oral Given 09/19/13 0945)     Initial diagnostic testing ordered.     Labs Review Labs Reviewed - No data to display Imaging Review No results found.  EKG Interpretation   None       MDM   1. Back pain with right-sided sciatica     Filed Vitals:   09/19/13 0922  BP: 181/85  Temp: 98.6 F (37 C)  Resp: 18    Afebrile, NAD, non-toxic appearing, AAOx4.  Patient with back pain.  No neurological deficits and normal neuro exam.  Patient can walk but states is painful.  No loss of bowel or bladder control.  No concern for cauda equina.  No fever, night sweats, weight loss, h/o cancer, IVDU.  RICE protocol and pain medicine indicated and discussed with patient.    I personally performed the services described in this documentation, which was scribed in my presence. The recorded information has been reviewed and is accurate.     Harlow Mares, PA-C 09/19/13 1142

## 2013-09-19 NOTE — Discharge Instructions (Signed)
Please follow up with your primary care physician in 1-2 days. If you do not have one please call the [facility] number listed above. Please take medications as prescribed. Please do not drive on the Valium. Please read all discharge instructions and return precautions.   Back Pain, Adult Low back pain is very common. About 1 in 5 people have back pain.The cause of low back pain is rarely dangerous. The pain often gets better over time.About half of people with a sudden onset of back pain feel better in just 2 weeks. About 8 in 10 people feel better by 6 weeks.  CAUSES Some common causes of back pain include:  Strain of the muscles or ligaments supporting the spine.  Wear and tear (degeneration) of the spinal discs.  Arthritis.  Direct injury to the back. DIAGNOSIS Most of the time, the direct cause of low back pain is not known.However, back pain can be treated effectively even when the exact cause of the pain is unknown.Answering your caregiver's questions about your overall health and symptoms is one of the most accurate ways to make sure the cause of your pain is not dangerous. If your caregiver needs more information, he or she may order lab work or imaging tests (X-rays or MRIs).However, even if imaging tests show changes in your back, this usually does not require surgery. HOME CARE INSTRUCTIONS For many people, back pain returns.Since low back pain is rarely dangerous, it is often a condition that people can learn to [facility] their own.   Remain active. It is stressful on the back to sit or stand in one place. Do not sit, drive, or stand in one place for more than 30 minutes at a time. Take short walks on level surfaces as soon as pain allows.Try to increase the length of time you walk each day.  Do not stay in bed.Resting more than 1 or 2 days can delay your recovery.  Do not avoid exercise or work.Your body is made to move.It is not dangerous to be  active, even though your back may hurt.Your back will likely heal faster if you return to being active before your pain is gone.  Pay attention to your body when you bend and lift. Many people have less discomfortwhen lifting if they bend their knees, keep the load close to their bodies,and avoid twisting. Often, the most comfortable positions are those that put less stress on your recovering back.  Find a comfortable position to sleep. Use a firm mattress and lie on your side with your knees slightly bent. If you lie on your back, put a pillow under your knees.  Only take over-the-counter or prescription medicines as directed by your caregiver. Over-the-counter medicines to reduce pain and inflammation are often the most helpful.Your caregiver may prescribe muscle relaxant drugs.These medicines help dull your pain so you can more quickly return to your normal activities and healthy exercise.  Put ice on the injured area.  Put ice in a plastic bag.  Place a towel between your skin and the bag.  Leave the ice on for 15-20 minutes, 03-04 times a day for the first 2 to 3 days. After that, ice and heat may be alternated to reduce pain and spasms.  Ask your caregiver about trying back exercises and gentle massage. This may be of some benefit.  Avoid feeling anxious or stressed.Stress increases muscle tension and can worsen back pain.It is important to recognize when you are anxious or stressed  and learn ways to manage it.Exercise is a great option. SEEK MEDICAL CARE IF:  You have pain that is not relieved with rest or medicine.  You have pain that does not improve in 1 week.  You have new symptoms.  You are generally not feeling well. SEEK IMMEDIATE MEDICAL CARE IF:   You have pain that radiates from your back into your legs.  You develop new bowel or bladder control problems.  You have unusual weakness or numbness in your arms or legs.  You develop nausea or vomiting.  You  develop abdominal pain.  You feel faint. Document Released: 08/08/2005 Document Revised: 02/07/2012 Document Reviewed: 12/27/2010 [facility] Patient Information 2014 [place], [place].   Sciatica Sciatica is pain, weakness, numbness, or tingling along the path of the sciatic nerve. The nerve starts in the lower back and runs down the back of each leg. The nerve controls the muscles in the lower leg and in the back of the knee, while also providing sensation to the back of the thigh, lower leg, and the sole of your foot. Sciatica is a symptom of another medical condition. For instance, nerve damage or certain conditions, such as a herniated disk or bone spur on the spine, pinch or put pressure on the sciatic nerve. This causes the pain, weakness, or other sensations normally associated with sciatica. Generally, sciatica only affects one side of the body. CAUSES   Herniated or slipped disc.  Degenerative disk disease.  A pain disorder involving the narrow muscle in the buttocks (piriformis syndrome).  Pelvic injury or fracture.  Pregnancy.  Tumor (rare). SYMPTOMS  Symptoms can vary from mild to very severe. The symptoms usually travel from the low back to the buttocks and down the back of the leg. Symptoms can include:  Mild tingling or dull aches in the lower back, leg, or hip.  Numbness in the back of the calf or sole of the foot.  Burning sensations in the lower back, leg, or hip.  Sharp pains in the lower back, leg, or hip.  Leg weakness.  Severe back pain inhibiting movement. These symptoms may get worse with coughing, sneezing, laughing, or prolonged sitting or standing. Also, being overweight may worsen symptoms. DIAGNOSIS  Your caregiver will perform a physical exam to look for common symptoms of sciatica. He or she may ask you to do certain movements or activities that would trigger sciatic nerve pain. Other tests may be performed to find the cause of the sciatica. These may  include:  Blood tests.  X-rays.  Imaging tests, such as an MRI or CT scan. TREATMENT  Treatment is directed at the cause of the sciatic pain. Sometimes, treatment is not necessary and the pain and discomfort goes away on its own. If treatment is needed, your caregiver may suggest:  Over-the-counter medicines to relieve pain.  Prescription medicines, such as anti-inflammatory medicine, muscle relaxants, or narcotics.  Applying heat or ice to the painful area.  Steroid injections to lessen pain, irritation, and inflammation around the nerve.  Reducing activity during periods of pain.  Exercising and stretching to strengthen your abdomen and improve flexibility of your spine. Your caregiver may suggest losing weight if the extra weight makes the back pain worse.  Physical therapy.  Surgery to eliminate what is pressing or pinching the nerve, such as a bone spur or part of a herniated disk. HOME CARE INSTRUCTIONS   Only take over-the-counter or prescription medicines for pain or discomfort as directed by your caregiver.  Apply ice to the affected area for 20 minutes, 3 4 times a day for the first 48 72 hours. Then try heat in the same way.  Exercise, stretch, or perform your usual activities if these do not aggravate your pain.  Attend physical therapy sessions as directed by your caregiver.  Keep all follow-up appointments as directed by your caregiver.  Do not wear high heels or shoes that do not provide proper support.  Check your mattress to see if it is too soft. A firm mattress may lessen your pain and discomfort. SEEK IMMEDIATE MEDICAL CARE IF:   You lose control of your bowel or bladder (incontinence).  You have increasing weakness in the lower back, pelvis, buttocks, or legs.  You have redness or swelling of your back.  You have a burning sensation when you urinate.  You have pain that gets worse when you lie down or awakens you at night.  Your pain is worse  than you have experienced in the past.  Your pain is lasting longer than 4 weeks.  You are suddenly losing weight without reason. MAKE SURE YOU:  Understand these instructions.  Will watch your condition.  Will get help right away if you are not doing well or get worse. Document Released: 08/02/2001 Document Revised: 02/07/2012 Document Reviewed: 12/18/2011 [facility] Patient Information 2014 [place].

## 2013-11-06 ENCOUNTER — Encounter (HOSPITAL_COMMUNITY): Payer: Self-pay | Admitting: Emergency Medicine

## 2013-11-06 ENCOUNTER — Emergency Department (HOSPITAL_COMMUNITY)
Admission: EM | Admit: 2013-11-06 | Discharge: 2013-11-06 | Disposition: A | Discharge status: Home / Self Care | Payer: BC Managed Care – PPO | Attending: Emergency Medicine | Admitting: Emergency Medicine

## 2013-11-06 DIAGNOSIS — J029 Acute pharyngitis, unspecified: Secondary | ICD-10-CM | POA: Insufficient documentation

## 2013-11-06 DIAGNOSIS — E669 Obesity, unspecified: Secondary | ICD-10-CM | POA: Insufficient documentation

## 2013-11-06 DIAGNOSIS — Z79899 Other long term (current) drug therapy: Secondary | ICD-10-CM | POA: Insufficient documentation

## 2013-11-06 DIAGNOSIS — H9209 Otalgia, unspecified ear: Secondary | ICD-10-CM | POA: Insufficient documentation

## 2013-11-06 DIAGNOSIS — J45909 Unspecified asthma, uncomplicated: Secondary | ICD-10-CM | POA: Insufficient documentation

## 2013-11-06 DIAGNOSIS — I10 Essential (primary) hypertension: Secondary | ICD-10-CM | POA: Insufficient documentation

## 2013-11-06 DIAGNOSIS — Z87891 Personal history of nicotine dependence: Secondary | ICD-10-CM | POA: Insufficient documentation

## 2013-11-06 LAB — RAPID STREP SCREEN (MED CTR MEBANE ONLY): Streptococcus, Group A Screen (Direct): NEGATIVE

## 2013-11-06 NOTE — ED Notes (Signed)
Patient states woke up this morning with sore throat and white spots in the back of her throat.   Patient states she worked out in the yard all day yesterday.

## 2013-11-06 NOTE — Discharge Instructions (Signed)
Sore Throat A sore throat is pain, burning, irritation, or scratchiness of the throat. There is often pain or tenderness when swallowing or talking. A sore throat may be accompanied by other symptoms, such as coughing, sneezing, fever, and swollen neck glands. A sore throat is often the first sign of another sickness, such as a cold, flu, strep throat, or mononucleosis (commonly known as mono). Most sore throats go away without medical treatment. CAUSES  The most common causes of a sore throat include:  A viral infection, such as a cold, flu, or mono.  A bacterial infection, such as strep throat, tonsillitis, or whooping cough.  Seasonal allergies.  Dryness in the air.  Irritants, such as smoke or pollution.  Gastroesophageal reflux disease (GERD). HOME CARE INSTRUCTIONS   Only take over-the-counter medicines as directed by your caregiver.  Drink enough fluids to keep your urine clear or pale yellow.  Rest as needed.  Try using throat sprays, lozenges, or sucking on hard candy to ease any pain (if older than 4 years or as directed).  Sip warm liquids, such as broth, herbal tea, or warm water with honey to relieve pain temporarily. You may also eat or drink cold or frozen liquids such as frozen ice pops.  Gargle with salt water (mix 1 tsp salt with 8 oz of water).  Do not smoke and avoid secondhand smoke.  Put a cool-mist humidifier in your bedroom at night to moisten the air. You can also turn on a hot shower and sit in the bathroom with the door closed for 5 10 minutes. SEEK IMMEDIATE MEDICAL CARE IF:  You have difficulty breathing.  You are unable to swallow fluids, soft foods, or your saliva.  You have increased swelling in the throat.  Your sore throat does not get better in 7 days.  You have nausea and vomiting.  You have a fever or persistent symptoms for more than 2 3 days.  You have a fever and your symptoms suddenly get worse. MAKE SURE YOU:   Understand  these instructions.  Will watch your condition.  Will get help right away if you are not doing well or get worse. Document Released: 09/15/2004 Document Revised: 07/25/2012 Document Reviewed: 04/15/2012 [facility] Patient Information 2014 [employer] [employer], [place]. Salt Water Gargle This solution will help make your mouth and throat feel better. HOME CARE INSTRUCTIONS   Mix 1 teaspoon of salt in 8 ounces of warm water.  Gargle with this solution as much or often as you need or as directed. Swish and gargle gently if you have any sores or wounds in your mouth.  Do not swallow this mixture. Document Released: 05/12/2004 Document Revised: 10/31/2011 Document Reviewed: 10/03/2008 [facility] Patient Information 2014 [place].

## 2013-11-06 NOTE — ED Provider Notes (Signed)
CSN: [id number]     Arrival date & time 11/06/13  8841 History  This chart was scribed for non-physician practitioner Charlann Lange PA-C working with Mercie Eon. Karle Starch, MD by Rolanda Lundborg, ED Scribe. This patient was seen in room TR07C/TR07C and the patient's care was started at 9:34 AM.   Chief Complaint  Patient presents with  . Sore Throat   The history is provided by the patient. No language interpreter was used.   HPI Comments: Angela Burton is a 43 y.o. female who presents to the Emergency Department complaining of a constant sore throat onset yesterday morning with pain with swallowing. She states she worked outside in the yard all day 2 days ago and felt fine. She reports low grade fever of 99.3 yesterday. She reports ear pain, body aches, congestion. She states she has had strep in the past and this feels the same.    Past Medical History  Diagnosis Date  . Asthma   . Hypertension   . Obesity    Past Surgical History  Procedure Laterality Date  . Tubal ligation     No family history on file. History  Substance Use Topics  . Smoking status: Former Research scientist (life sciences)  . Smokeless tobacco: Not on file  . Alcohol Use: Yes   OB History   Grav Para Term Preterm Abortions TAB SAB Ect Mult Living                 Review of Systems  Constitutional: Positive for fever.  HENT: Positive for congestion, ear pain and sore throat.   Musculoskeletal: Positive for myalgias.  All other systems reviewed and are negative.      Allergies  Sulfa antibiotics; Aspirin; and Erythromycin  Home Medications   Current Outpatient Rx  Name  Route  Sig  Dispense  Refill  . albuterol (PROVENTIL HFA;VENTOLIN HFA) 108 (90 BASE) MCG/ACT inhaler   Inhalation   Inhale 1-2 puffs into the lungs every 6 (six) hours as needed for wheezing or shortness of breath.         Marland Kitchen albuterol (PROVENTIL) (5 MG/ML) 0.5% nebulizer solution   Nebulization   Take 1 mL (5 mg total) by nebulization every 4 (four)  hours as needed for wheezing or shortness of breath.   20 mL   12   . diazepam (VALIUM) 5 MG tablet   Oral   Take 1 tablet (5 mg total) by mouth every 12 (twelve) hours as needed for muscle spasms.   12 tablet   0   . ibuprofen (ADVIL,MOTRIN) 800 MG tablet   Oral   Take 1 tablet (800 mg total) by mouth every 6 (six) hours as needed for mild pain or moderate pain.   21 tablet   0    BP 146/92  Pulse 66  Temp(Src) 98.9 F (37.2 C) (Oral)  Resp 18  Ht 5\' 6"  (1.676 m)  Wt 240 lb (108.863 kg)  BMI 38.76 kg/m2  SpO2 100% Physical Exam  Nursing note and vitals reviewed. Constitutional: She is oriented to person, place, and time. She appears well-developed and well-nourished. No distress.  HENT:  Head: Normocephalic and atraumatic.  Right Ear: Tympanic membrane normal.  Left Ear: Tympanic membrane normal.  Nose: Mucosal edema present.  Mouth/Throat: Oropharynx is clear and moist. No oropharyngeal exudate.  Eyes: EOM are normal.  Neck: Neck supple. No tracheal deviation present.  Cardiovascular: Normal rate and regular rhythm.   Pulmonary/Chest: Effort normal and breath sounds normal. No respiratory distress.  She has no wheezes. She has no rales.  Musculoskeletal: Normal range of motion.  Lymphadenopathy:    She has no cervical adenopathy.  Neurological: She is alert and oriented to person, place, and time.  Skin: Skin is warm and dry.  Psychiatric: She has a normal mood and affect. Her behavior is normal.    ED Course  Procedures (including critical care time) Medications - No data to display  DIAGNOSTIC STUDIES: Oxygen Saturation is 100% on RA, normal by my interpretation.    COORDINATION OF CARE: 10:14 AM- Discussed treatment plan with pt. Pt agrees to plan.    Labs Review Labs Reviewed  RAPID STREP SCREEN  CULTURE, GROUP A STREP   Results for orders placed during the hospital encounter of 11/06/13  RAPID STREP SCREEN      Result Value Ref Range    Streptococcus, Group A Screen (Direct) NEGATIVE  NEGATIVE    Imaging Review No results found.   EKG Interpretation None      MDM   Final diagnoses:  None    1. Pharyngitis  Uncomplicated pharyngitis. Recommend supportive care.  I personally performed the services described in this documentation, which was scribed in my presence. The recorded information has been reviewed and is accurate.     Dewaine Oats, PA-C 11/06/13 1044

## 2013-11-08 LAB — CULTURE, GROUP A STREP

## 2013-11-08 NOTE — ED Provider Notes (Signed)
Medical screening examination/treatment/procedure(s) were performed by non-physician practitioner and as supervising physician I was immediately available for consultation/collaboration.   EKG Interpretation None        Merville Hijazi B. Karle Starch, MD 11/08/13 [phone]

## 2013-11-13 ENCOUNTER — Emergency Department (HOSPITAL_COMMUNITY)
Admission: EM | Admit: 2013-11-13 | Discharge: 2013-11-13 | Disposition: A | Discharge status: Home / Self Care | Payer: BC Managed Care – PPO | Attending: Emergency Medicine | Admitting: Emergency Medicine

## 2013-11-13 ENCOUNTER — Encounter (HOSPITAL_COMMUNITY): Payer: Self-pay | Admitting: Emergency Medicine

## 2013-11-13 ENCOUNTER — Emergency Department (HOSPITAL_COMMUNITY): Payer: BC Managed Care – PPO

## 2013-11-13 DIAGNOSIS — J45901 Unspecified asthma with (acute) exacerbation: Secondary | ICD-10-CM

## 2013-11-13 DIAGNOSIS — Z79899 Other long term (current) drug therapy: Secondary | ICD-10-CM | POA: Insufficient documentation

## 2013-11-13 DIAGNOSIS — R05 Cough: Secondary | ICD-10-CM | POA: Insufficient documentation

## 2013-11-13 DIAGNOSIS — J3489 Other specified disorders of nose and nasal sinuses: Secondary | ICD-10-CM | POA: Insufficient documentation

## 2013-11-13 DIAGNOSIS — R112 Nausea with vomiting, unspecified: Secondary | ICD-10-CM | POA: Insufficient documentation

## 2013-11-13 DIAGNOSIS — I10 Essential (primary) hypertension: Secondary | ICD-10-CM | POA: Insufficient documentation

## 2013-11-13 DIAGNOSIS — J069 Acute upper respiratory infection, unspecified: Secondary | ICD-10-CM | POA: Insufficient documentation

## 2013-11-13 DIAGNOSIS — Z87891 Personal history of nicotine dependence: Secondary | ICD-10-CM | POA: Insufficient documentation

## 2013-11-13 DIAGNOSIS — R059 Cough, unspecified: Secondary | ICD-10-CM | POA: Insufficient documentation

## 2013-11-13 DIAGNOSIS — R197 Diarrhea, unspecified: Secondary | ICD-10-CM | POA: Insufficient documentation

## 2013-11-13 LAB — RAPID STREP SCREEN (MED CTR MEBANE ONLY): Streptococcus, Group A Screen (Direct): NEGATIVE

## 2013-11-13 MED ORDER — HYDROCHLOROTHIAZIDE 25 MG PO TABS: 25.0000 mg | ORAL_TABLET | Freq: Every day | ORAL | Status: DC

## 2013-11-13 MED ORDER — GUAIFENESIN 100 MG/5ML PO LIQD: 100.0000 mg | ORAL | Status: DC | PRN

## 2013-11-13 MED ORDER — PREDNISONE 20 MG PO TABS: ORAL_TABLET | ORAL | Status: DC

## 2013-11-13 MED ORDER — BENZONATATE 100 MG PO CAPS: 100.0000 mg | ORAL_CAPSULE | Freq: Three times a day (TID) | ORAL | Status: DC

## 2013-11-13 MED ORDER — ALBUTEROL SULFATE HFA 108 (90 BASE) MCG/ACT IN AERS
2.0000 | INHALATION_SPRAY | RESPIRATORY_TRACT | Status: DC | PRN
  Administered 2013-11-13: 2 via RESPIRATORY_TRACT
  Filled 2013-11-13: qty 6.7

## 2013-11-13 NOTE — ED Provider Notes (Signed)
CSN: [id number]     Arrival date & time 11/13/13  1207 History   First MD Initiated Contact with Patient 11/13/13 1226    This chart was scribed for Domenic Moras PA-C, a non-physician practitioner working with Richarda Blade, MD by Denice Bors, ED Scribe. This patient was seen in room TR06C/TR06C and the patient's care was started at 12:44 PM      Chief Complaint  Patient presents with  . URI  . Sore Throat  . Cough     (Consider location/radiation/quality/duration/timing/severity/associated sxs/prior Treatment) The history is provided by the patient. No language interpreter was used.   HPI Comments: Angela Burton is a 43 y.o. female who presents to the Emergency Department complaining of persistent moderate sore throat onset 9 days. Reports associated productive cough with phlegm, emesis (3 times a day), nausea, non-bloody diarrhea, decreased appetite, subjective fever, wheeze, and congestion. Reports taking tylenol, inhaler, and nebulizer with moderate relief of symptoms. Denies any aggravating factors. Denies associated rash. States she is using nebulizer and inhaler 7 times a day. Reports PMHx of asthma and seasonal allegeries. Denies PMHx of intubation secondary to asthma. Reports PMHx of HTN, but denies taking any medications for blood pressure medication. Pt was evaluated for the same 7 days ago.    Past Medical History  Diagnosis Date  . Asthma   . Hypertension   . Obesity    Past Surgical History  Procedure Laterality Date  . Tubal ligation     History reviewed. No pertinent family history. History  Substance Use Topics  . Smoking status: Former Research scientist (life sciences)  . Smokeless tobacco: Not on file  . Alcohol Use: Yes   OB History   Grav Para Term Preterm Abortions TAB SAB Ect Mult Living                 Review of Systems  Constitutional: Negative for fever.  HENT: Positive for congestion and sore throat.   Psychiatric/Behavioral: Negative for confusion.        Allergies  Sulfa antibiotics; Aspirin; and Erythromycin  Home Medications   Current Outpatient Rx  Name  Route  Sig  Dispense  Refill  . albuterol (PROVENTIL HFA;VENTOLIN HFA) 108 (90 BASE) MCG/ACT inhaler   Inhalation   Inhale 1-2 puffs into the lungs every 6 (six) hours as needed for wheezing or shortness of breath.          BP 170/101  Pulse 82  Temp(Src) 98.7 F (37.1 C) (Oral)  Resp 18  SpO2 100% Physical Exam  Nursing note and vitals reviewed. Constitutional: She is oriented to person, place, and time. She appears well-developed and well-nourished. No distress.  HENT:  Head: Normocephalic and atraumatic.  Nose: Nose normal.  Mouth/Throat: Uvula is midline, oropharynx is clear and moist and mucous membranes are normal. No oropharyngeal exudate, posterior oropharyngeal edema or posterior oropharyngeal erythema.  Eyes: EOM are normal.  Neck: Neck supple. No tracheal deviation present. No thyromegaly present.  Cardiovascular: Normal rate.   Pulmonary/Chest: Effort normal and breath sounds normal. No respiratory distress. She has no wheezes. She has no rales.  Musculoskeletal: Normal range of motion.  Lymphadenopathy:    She has no cervical adenopathy.  Neurological: She is alert and oriented to person, place, and time.  Skin: Skin is warm and dry.  Psychiatric: She has a normal mood and affect. Her behavior is normal.    ED Course  Procedures (including critical care time)   COORDINATION OF CARE:  Nursing notes reviewed.  Vital signs reviewed. Initial pt interview and examination performed.   12:58 PM-Discussed work up plan with pt at bedside, which includes rapid strep screen and CXR. Pt agrees with plan.  1:12 PM Nursing Notes Reviewed/ Care Coordinated  Interpretation of Laboratory Data incorporated into ED treatment Discussed results and treatment plan with pt. Pt demonstrates understanding and agrees with plan.     Initial diagnostic testing  ordered.     Labs Review Labs Reviewed  RAPID STREP SCREEN  CULTURE, GROUP A STREP   Imaging Review Dg Chest 2 View  11/13/2013   CLINICAL DATA:  Sore throat, cough  EXAM: CHEST  2 VIEW  COMPARISON:  DG CHEST 2 VIEW dated 05/26/2013  FINDINGS: Normal mediastinum and cardiac silhouette. Chronic central bronchitic markings. Normal pulmonary vasculature. No effusion, infiltrate, or pneumothorax.  IMPRESSION: Mild bronchitic change similar to prior.  No acute findings.  .   Electronically Signed   By: Suzy Bouchard M.D.   On: 11/13/2013 13:32     EKG Interpretation None      MDM   Final diagnoses:  URI (upper respiratory infection)  Asthma exacerbation, mild   BP 170/101  Pulse 82  Temp(Src) 98.7 F (37.1 C) (Oral)  Resp 18  SpO2 100%  I have reviewed nursing notes and vital signs. I personally reviewed the imaging tests through PACS system  I reviewed available ER/hospitalization records thought the EMR   I personally performed the services described in this documentation, which was scribed in my presence. The recorded information has been reviewed and is accurate.     Domenic Moras, PA-C 11/13/13 1342

## 2013-11-13 NOTE — ED Provider Notes (Signed)
Medical screening examination/treatment/procedure(s) were performed by non-physician practitioner and as supervising physician I was immediately available for consultation/collaboration.  Richarda Blade, MD 11/13/13 [phone]

## 2013-11-13 NOTE — ED Notes (Signed)
Pt c/o URI sx with cough and congestion; pt sts sore throat; pt sts seen here for same

## 2013-11-13 NOTE — Discharge Instructions (Signed)
Asthma Attack Prevention Although there is no way to prevent asthma from starting, you can take steps to control the disease and reduce its symptoms. Learn about your asthma and how to control it. Take an active role to control your asthma by working with your health care provider to create and follow an asthma action plan. An asthma action plan guides you in:  Taking your medicines properly.  Avoiding things that set off your asthma or make your asthma worse (asthma triggers).  Tracking your level of asthma control.  Responding to worsening asthma.  Seeking emergency care when needed. To track your asthma, keep records of your symptoms, check your peak flow number using a handheld device that shows how well air moves out of your lungs (peak flow meter), and get regular asthma checkups.  WHAT ARE SOME WAYS TO PREVENT AN ASTHMA ATTACK?  Take medicines as directed by your health care provider.  Keep track of your asthma symptoms and level of control.  With your health care provider, write a detailed plan for taking medicines and managing an asthma attack. Then be sure to follow your action plan. Asthma is an ongoing condition that needs regular monitoring and treatment.  Identify and avoid asthma triggers. Many outdoor allergens and irritants (such as pollen, mold, cold air, and air pollution) can trigger asthma attacks. Find out what your asthma triggers are and take steps to avoid them.  Monitor your breathing. Learn to recognize warning signs of an attack, such as coughing, wheezing, or shortness of breath. Your lung function may decrease before you notice any signs or symptoms, so regularly measure and record your peak airflow with a home peak flow meter.  Identify and treat attacks early. If you act quickly, you are less likely to have a severe attack. You will also need less medicine to control your symptoms. When your peak flow measurements decrease and alert you to an upcoming attack,  take your medicine as instructed and immediately stop any activity that may have triggered the attack. If your symptoms do not improve, get medical help.  Pay attention to increasing quick-relief inhaler use. If you find yourself relying on your quick-relief inhaler, your asthma is not under control. See your health care provider about adjusting your treatment. WHAT CAN MAKE MY SYMPTOMS WORSE? A number of common things can set off or make your asthma symptoms worse and cause temporary increased inflammation of your airways. Keep track of your asthma symptoms for several weeks, detailing all the environmental and emotional factors that are linked with your asthma. When you have an asthma attack, go back to your asthma diary to see which factor, or combination of factors, might have contributed to it. Once you know what these factors are, you can take steps to control many of them. If you have allergies and asthma, it is important to take asthma prevention steps at home. Minimizing contact with the substance to which you are allergic will help prevent an asthma attack. Some triggers and ways to avoid these triggers are: Animal Dander:  Some people are allergic to the flakes of skin or dried saliva from animals with fur or feathers.   There is no such thing as a hypoallergenic dog or cat breed. All dogs or cats can cause allergies, even if they don't shed.  Keep these pets out of your home.  If you are not able to keep a pet outdoors, keep the pet out of your bedroom and other sleeping areas at all  times, and keep the door closed.  Remove carpets and furniture covered with cloth from your home. If that is not possible, keep the pet away from fabric-covered furniture and carpets. Dust Mites: Many people with asthma are allergic to dust mites. Dust mites are tiny bugs that are found in every home in mattresses, pillows, carpets, fabric-covered furniture, bedcovers, clothes, stuffed toys, and other  fabric-covered items.   Cover your mattress in a special dust-proof cover.  Cover your pillow in a special dust-proof cover, or wash the pillow each week in hot water. Water must be hotter than 130 F (54.4 C) to kill dust mites. Cold or warm water used with detergent and bleach can also be effective.  Wash the sheets and blankets on your bed each week in hot water.  Try not to sleep or lie on cloth-covered cushions.  Call ahead when traveling and ask for a smoke-free hotel room. Bring your own bedding and pillows in case the hotel only supplies feather pillows and down comforters, which may contain dust mites and cause asthma symptoms.  Remove carpets from your bedroom and those laid on concrete, if you can.  Keep stuffed toys out of the bed, or wash the toys weekly in hot water or cooler water with detergent and bleach. Cockroaches: Many people with asthma are allergic to the droppings and remains of cockroaches.   Keep food and garbage in closed containers. Never leave food out.  Use poison baits, traps, powders, gels, or paste (for example, boric acid).  If a spray is used to kill cockroaches, stay out of the room until the odor goes away. Indoor Mold:  Fix leaky faucets, pipes, or other sources of water that have mold around them.  Clean floors and moldy surfaces with a fungicide or diluted bleach.  Avoid using humidifiers, vaporizers, or swamp coolers. These can spread molds through the air. Pollen and Outdoor Mold:  When pollen or mold spore counts are high, try to keep your windows closed.  Stay indoors with windows closed from late morning to afternoon. Pollen and some mold spore counts are highest at that time.  Ask your health care provider whether you need to take anti-inflammatory medicine or increase your dose of the medicine before your allergy season starts. Other Irritants to Avoid:  Tobacco smoke is an irritant. If you smoke, ask your health care provider how  you can quit. Ask family members to quit smoking too. Do not allow smoking in your home or car.  If possible, do not use a wood-burning stove, kerosene heater, or fireplace. Minimize exposure to all sources of smoke, including to incense, candles, fires, and fireworks.  Try to stay away from strong odors and sprays, such as perfume, talcum powder, hair spray, and paints.  Decrease humidity in your home and use an indoor air cleaning device. Reduce indoor humidity to below 60%. Dehumidifiers or central air conditioners can do this.  Decrease house dust exposure by changing furnace and air cooler filters frequently.  Try to have someone else vacuum for you once or twice a week. Stay out of rooms while they are being vacuumed and for a short while afterward.  If you vacuum, use a dust mask from a hardware store, a double-layered or microfilter vacuum cleaner bag, or a vacuum cleaner with a HEPA filter.  Sulfites in foods and beverages can be irritants. Do not drink beer or wine or eat dried fruit, processed potatoes, or shrimp if they cause asthma symptoms.  Cold air can trigger an asthma attack. Cover your nose and mouth with a scarf on cold or windy days.  Several health conditions can make asthma more difficult to manage, including a runny nose, sinus infections, reflux disease, psychological stress, and sleep apnea. Work with your health care provider to manage these conditions.  Avoid close contact with people who have a respiratory infection such as a cold or the flu, since your asthma symptoms may get worse if you catch the infection. Wash your hands thoroughly after touching items that may have been handled by people with a respiratory infection.  Get a flu shot every year to protect against the flu virus, which often makes asthma worse for days or weeks. Also get a pneumonia shot if you have not previously had one. Unlike the flu shot, the pneumonia shot does not need to be given  yearly. Medicines:  Talk to your health care provider about whether it is safe for you to take aspirin or non-steroidal anti-inflammatory medicines (NSAIDs). In a small number of people with asthma, aspirin and NSAIDs can cause asthma attacks. These medicines must be avoided by people who have known aspirin-sensitive asthma. It is important that people with aspirin-sensitive asthma read labels of all over-the-counter medicines used to treat pain, colds, coughs, and fever.  Beta blockers and ACE inhibitors are other medicines you should discuss with your health care provider. HOW CAN I FIND OUT WHAT I AM ALLERGIC TO? Ask your asthma health care provider about allergy skin testing or blood testing (the RAST test) to identify the allergens to which you are sensitive. If you are found to have allergies, the most important thing to do is to try to avoid exposure to any allergens that you are sensitive to as much as possible. Other treatments for allergies, such as medicines and allergy shots (immunotherapy) are available.  CAN I EXERCISE? Follow your health care provider's advice regarding asthma treatment before exercising. It is important to maintain a regular exercise program, but vigorous exercise, or exercise in cold, humid, or dry environments can cause asthma attacks, especially for those people who have exercise-induced asthma. Document Released: 07/27/2009 Document Revised: 04/10/2013 Document Reviewed: 02/13/2013 [facility] Patient Information 2014 [place].  Viral Infections A viral infection can be caused by different types of viruses.Most viral infections are not serious and resolve on their own. However, some infections may cause severe symptoms and may lead to further complications. SYMPTOMS Viruses can frequently cause:  Minor sore throat.  Aches and pains.  Headaches.  Runny nose.  Different types of rashes.  Watery eyes.  Tiredness.  Cough.  Loss of  appetite.  Gastrointestinal infections, resulting in nausea, vomiting, and diarrhea. These symptoms do not respond to antibiotics because the infection is not caused by bacteria. However, you might catch a bacterial infection following the viral infection. This is sometimes called a "superinfection." Symptoms of such a bacterial infection may include:  Worsening sore throat with pus and difficulty swallowing.  Swollen neck glands.  Chills and a high or persistent fever.  Severe headache.  Tenderness over the sinuses.  Persistent overall ill feeling (malaise), muscle aches, and tiredness (fatigue).  Persistent cough.  Yellow, green, or brown mucus production with coughing. HOME CARE INSTRUCTIONS   Only take over-the-counter or prescription medicines for pain, discomfort, diarrhea, or fever as directed by your caregiver.  Drink enough water and fluids to keep your urine clear or pale yellow. Sports drinks can provide valuable electrolytes, sugars, and hydration.  Get plenty of rest and maintain proper nutrition. Soups and broths with crackers or rice are fine. SEEK IMMEDIATE MEDICAL CARE IF:   You have severe headaches, shortness of breath, chest pain, neck pain, or an unusual rash.  You have uncontrolled vomiting, diarrhea, or you are unable to keep down fluids.  You or your child has an oral temperature above 102 F (38.9 C), not controlled by medicine.  Your baby is older than 3 months with a rectal temperature of 102 F (38.9 C) or higher.  Your baby is 53 months old or younger with a rectal temperature of 100.4 F (38 C) or higher. MAKE SURE YOU:   Understand these instructions.  Will watch your condition.  Will get help right away if you are not doing well or get worse. Document Released: 05/18/2005 Document Revised: 10/31/2011 Document Reviewed: 12/13/2010 [facility] Patient Information 2014 [place], [place].   Emergency Department Resource Guide 1) Find a Doctor  and Pay Out of Pocket Although you won't have to find out who is covered by your insurance plan, it is a good idea to ask around and get recommendations. You will then need to call the office and see if the doctor you have chosen will accept you as a new patient and what types of options they offer for patients who are self-pay. Some doctors offer discounts or will set up payment plans for their patients who do not have insurance, but you will need to ask so you aren't surprised when you get to your appointment.  2) Contact Your Local Health Department Not all health departments have doctors that can see patients for sick visits, but many do, so it is worth a call to see if yours does. If you don't know where your local health department is, you can check in your phone book. The CDC also has a tool to help you locate your state's health department, and many state websites also have listings of all of their local health departments.  3) Find a [facility] If your illness is not likely to be very severe or complicated, you may want to try a walk in clinic. These are popping up all over the country in pharmacies, drugstores, and shopping centers. They're usually staffed by nurse practitioners or physician assistants that have been trained to treat common illnesses and complaints. They're usually fairly quick and inexpensive. However, if you have serious medical issues or chronic medical problems, these are probably not your best option.  No Primary Care Doctor: - Call Health Connect at  [phone] - they can help you locate a primary care doctor that  accepts your insurance, provides certain services, etc. - Physician Referral Service- [phone]  Chronic Pain Problems: Organization         Address  Phone   Notes  [facility]  [phone] Patients need to be referred by their primary care doctor.   Medication Assistance: Organization         Address  Phone    Notes  [facility] Medication [facility] [address] [place], [place] [address] [phone] --Must be a resident of [facility] -- Must have NO insurance coverage whatsoever (no Medicaid/ Medicare, etc.) -- The pt. MUST have a primary care doctor that directs their care regularly and follows them in the community   MedAssist  [phone]   [employer]  [phone]    Agencies that provide inexpensive medical care: Organization  Address  Phone   Notes  [place]  [phone]   [facility]    [phone]   [facility] [place], [place] [address] [phone]   [place] [address], [place] [phone]   Planned Parenthood    [phone]   [facility]    [phone]   [place] and [place] [address], [place] Phone:  [phone], Fax:  [phone] Hours of Operation:  9 am - 6 pm, M-F.  Also accepts Medicaid/Medicare and self-pay.  [employer] for [place], [address], [place] Phone: [phone], Fax: [phone]. Hours of Operation:  8:30 am - 5:30 pm, M-F.  Also accepts Medicaid and self-pay.  [facility] [place] [address], [place] Phone: [phone]   [place], [place], [place] [phone], Ext. [phone] Mondays & Thursdays: 7-9 AM.  First 15 patients are seen on a first come, first serve basis.    [place] Providers:  Organization         Address  Phone   Notes  [facility] [address], [place] [phone] Also accepts self-pay patients.  [facility] [address], [place]  [phone]   [place], [address], [place] [phone]   [facility]  Medicine [address] [place] [phone]   Lucianne Lei [address], [place]   [phone] Only accepts [place] Access [place] patients after they have their name applied to their card.   Self-Pay (no insurance) in [facility]:  Organization         Address  Phone   Notes  Sickle Cell Patients, [facility] [place] [phone]   [facility] Urgent Care [place] [phone]   [facility] [place]  [place], [place], [place] [phone]   [facility]/Dr. Osei-Bonsu  [address] [place] or [address], [place] [phone] Phone number for both [place] and [place] locations is the same.  Urgent Medical and [facility] [address] [place] [phone]   [facility] [address] [place] or [address] [phone]   [facility] [address] [place] [phone], phone; [phone], fax Sees patients 1st and 3rd Saturday of every month.  Must not qualify for public or private insurance (i.e. Medicaid, Medicare, [place] Health Choice, Veterans' Benefits)  Household income should be no more than 200% of the poverty level The clinic cannot treat you if you are pregnant or think you are pregnant  Sexually transmitted diseases are not treated at the clinic.    Dental Care: Organization         Address  Phone  Notes  [facility] Department of [facility] [place] [phone] Accepts children up to age 55 who are enrolled in [place] or [place]; pregnant women with a Medicaid card; and children who have applied for Medicaid or [place] Health Choice, but were declined, whose parents can pay a reduced fee at time of  service.  [facility] Department of [facility]  [address], [place] [phone] Accepts children up to age 26 who are enrolled in [place] or [place]; pregnant women with a Medicaid card; and children who have applied for Medicaid or [place] Health Choice, but were declined, whose parents can pay a reduced fee at time of service.  [place] Adult Dental Access PROGRAM  [place] [phone] Patients are seen by appointment only. Walk-ins are not accepted. [place] will see patients 47 years of age and older. Monday - Tuesday (8am-5pm) Most Wednesdays (8:30-5pm) $30 per visit, cash only  [facility] Adult Dental Access PROGRAM  [address], [facility] [place] [phone] Patients are seen by appointment only. Walk-ins are not accepted. [place] will see patients 77 years of age and older. One Wednesday Evening (Monthly: Volunteer Based).  $30 per visit, cash only  [place]  [phone] for adults; Children under age 28, call Graduate Pediatric Dentistry at [phone]. Children aged 59-14, please call [phone] to request a pediatric application.  Dental services are provided in all areas of dental care including fillings, crowns and bridges, complete and partial dentures, implants, gum treatment, root canals, and extractions. Preventive care is also provided. Treatment is provided to both adults and children. Patients are selected via a lottery and there is often a waiting list.   [facility] [address] [place]  [phone] [email or web address]   [facility] [address] [place], [place] [phone], Ext. [phone] Second and Fourth Thursday of each month, opens at 6:30 AM; Clinic ends at 9 AM.  Patients are seen on a first-come first-served basis, and a limited number are seen during each clinic.   [facility]  [address] Hillard Danker [place], [place] [phone]   Eligibility Requirements You must have lived in  [place], [place], or [place] counties for at least the last three months.   You cannot be eligible for state or federal sponsored [employer], including [employer], [place], or [employer].   You generally cannot be eligible for healthcare insurance through your employer.    How to apply: Eligibility screenings are held every Tuesday and Wednesday afternoon from 1:00 pm until 4:00 pm. You do not need an appointment for the interview!  [facility] [address], [place], [place]   [place]  [place] Department  [place]  [phone]    Behavioral Health Resources in the Community: Intensive Outpatient Programs Organization         Address  Phone  Notes  [place]. [address], [place], [place] [phone]   [facility] Outpatient [address] [place], [place]   ADS: Alcohol & Drug Svcs [address], [place], [place]   [place] [facility] [address],  [place], [place] or [phone]   Substance Abuse Resources Organization         Address  Phone  Notes  Alcohol and Drug Services  [phone]   [place]  [phone]   [facility] [place]  [phone]   Chinita Pester  [phone]   Residential & Outpatient Substance Abuse Program  [phone]   Psychological Services Organization         Address  Phone  Notes  [facility] [place] [facility]  [place]   [place] [address], [place] or [phone]    Mobile Crisis Teams Organization         Address  Phone  Notes  [facility], Mobile Crisis Care Unit  [phone]   [facility]  [address] [place], [place]   [place] [address] [place] [phone]    Self-Help/Support Groups Organization         Address  Phone             Notes  [place]. of [place] - variety of support groups  [place] Call for more information  Narcotics Anonymous (NA), [facility] [address], [employer] [place]  2 meetings at this location   Special educational needs teacher         Address  Phone  Notes  [facility]idential [facility] [place],    [place]  [phone]   [facility]  [address], [place] [id number], [place], [place]   [place], [place] [phone] Admissions: 8am-3pm M-F  Incentives Substance [place] [address],    [place], [place] [phone]   [facility] [address] [place], [place], [place]   [facility] [address]  [place], [place]   [facility] - Intensive Outpatient [address] Kristeen [address], [place], [place]   [facility] ([place].,  [place], [place] [phone] or [phone]   [facility] (RT[facility]) [address] [place], [place] Accepts Medicaid  [facility] [place] [address],  [place] [phone] Substance Abuse/Addiction Treatment   [facility] Organization         Address  Phone  Notes  [facility]  [phone]   Domenic Schwab, PhD [address] [place], [place]   [phone] or [phone]   [place], [place] [phone]   [facility] [address] [place], [place] [phone] Insurance/Medicaid/sponsorship through [facility] and Families [address], [address] [place]                                    [place], [place] [phone] [place] [address] [place], [place] [phone]    Dr. Adele Schilder  [phone]   [facility] Dept. 1) [address], [place] 2) [place] 3)  [place] [address], [place] [phone]  [phone]   [place] [phone] or [phone] (After Hours)

## 2013-11-15 LAB — CULTURE, GROUP A STREP

## 2014-08-22 ENCOUNTER — Emergency Department (HOSPITAL_COMMUNITY)
Admission: EM | Admit: 2014-08-22 | Discharge: 2014-08-22 | Disposition: A | Discharge status: Home / Self Care | Payer: BC Managed Care – PPO | Attending: Emergency Medicine | Admitting: Emergency Medicine

## 2014-08-22 ENCOUNTER — Encounter (HOSPITAL_COMMUNITY): Payer: Self-pay | Admitting: Emergency Medicine

## 2014-08-22 DIAGNOSIS — R61 Generalized hyperhidrosis: Secondary | ICD-10-CM | POA: Insufficient documentation

## 2014-08-22 DIAGNOSIS — Z3202 Encounter for pregnancy test, result negative: Secondary | ICD-10-CM | POA: Insufficient documentation

## 2014-08-22 DIAGNOSIS — E669 Obesity, unspecified: Secondary | ICD-10-CM | POA: Insufficient documentation

## 2014-08-22 DIAGNOSIS — R1084 Generalized abdominal pain: Secondary | ICD-10-CM | POA: Insufficient documentation

## 2014-08-22 DIAGNOSIS — R112 Nausea with vomiting, unspecified: Secondary | ICD-10-CM | POA: Insufficient documentation

## 2014-08-22 DIAGNOSIS — Z79899 Other long term (current) drug therapy: Secondary | ICD-10-CM | POA: Insufficient documentation

## 2014-08-22 DIAGNOSIS — E860 Dehydration: Secondary | ICD-10-CM | POA: Insufficient documentation

## 2014-08-22 DIAGNOSIS — J029 Acute pharyngitis, unspecified: Secondary | ICD-10-CM | POA: Insufficient documentation

## 2014-08-22 DIAGNOSIS — J45901 Unspecified asthma with (acute) exacerbation: Secondary | ICD-10-CM | POA: Insufficient documentation

## 2014-08-22 DIAGNOSIS — Z87891 Personal history of nicotine dependence: Secondary | ICD-10-CM | POA: Insufficient documentation

## 2014-08-22 DIAGNOSIS — R591 Generalized enlarged lymph nodes: Secondary | ICD-10-CM | POA: Insufficient documentation

## 2014-08-22 DIAGNOSIS — E876 Hypokalemia: Secondary | ICD-10-CM | POA: Insufficient documentation

## 2014-08-22 DIAGNOSIS — I10 Essential (primary) hypertension: Secondary | ICD-10-CM | POA: Insufficient documentation

## 2014-08-22 LAB — URINALYSIS, ROUTINE W REFLEX MICROSCOPIC
Glucose, UA: NEGATIVE mg/dL
Ketones, ur: 15 mg/dL — AB
NITRITE: NEGATIVE
Protein, ur: 30 mg/dL — AB
SPECIFIC GRAVITY, URINE: 1.029 (ref 1.005–1.030)
Urobilinogen, UA: 2 mg/dL — ABNORMAL HIGH (ref 0.0–1.0)
pH: 6 (ref 5.0–8.0)

## 2014-08-22 LAB — URINE MICROSCOPIC-ADD ON

## 2014-08-22 LAB — I-STAT CHEM 8, ED
BUN: 7 mg/dL (ref 6–23)
Calcium, Ion: 1.08 mmol/L — ABNORMAL LOW (ref 1.12–1.23)
Chloride: 102 mEq/L (ref 96–112)
Creatinine, Ser: 0.8 mg/dL (ref 0.50–1.10)
GLUCOSE: 168 mg/dL — AB (ref 70–99)
HEMATOCRIT: 51 % — AB (ref 36.0–46.0)
Hemoglobin: 17.3 g/dL — ABNORMAL HIGH (ref 12.0–15.0)
Potassium: 3.2 mmol/L — ABNORMAL LOW (ref 3.5–5.1)
SODIUM: 138 mmol/L (ref 135–145)
TCO2: 19 mmol/L (ref 0–100)

## 2014-08-22 LAB — PREGNANCY, URINE: Preg Test, Ur: NEGATIVE

## 2014-08-22 MED ORDER — HYDROCODONE-ACETAMINOPHEN 7.5-325 MG/15ML PO SOLN: 10.0000 mL | Freq: Four times a day (QID) | ORAL | Status: DC | PRN

## 2014-08-22 MED ORDER — ALBUTEROL SULFATE HFA 108 (90 BASE) MCG/ACT IN AERS: 1.0000 | INHALATION_SPRAY | Freq: Four times a day (QID) | RESPIRATORY_TRACT | Status: DC | PRN

## 2014-08-22 MED ORDER — PENICILLIN G BENZATHINE & PROC 1200000 UNIT/2ML IM SUSP
1.2000 10*6.[IU] | Freq: Once | INTRAMUSCULAR | Status: AC
  Administered 2014-08-22: 1.2 10*6.[IU] via INTRAMUSCULAR
  Filled 2014-08-22: qty 2

## 2014-08-22 MED ORDER — HYDROCHLOROTHIAZIDE 25 MG PO TABS: 25.0000 mg | ORAL_TABLET | Freq: Every day | ORAL | Status: DC

## 2014-08-22 MED ORDER — MORPHINE SULFATE 4 MG/ML IJ SOLN
4.0000 mg | Freq: Once | INTRAMUSCULAR | Status: AC
  Administered 2014-08-22: 4 mg via INTRAVENOUS
  Filled 2014-08-22: qty 1

## 2014-08-22 MED ORDER — IPRATROPIUM-ALBUTEROL 0.5-2.5 (3) MG/3ML IN SOLN
3.0000 mL | Freq: Once | RESPIRATORY_TRACT | Status: AC
  Administered 2014-08-22: 3 mL via RESPIRATORY_TRACT
  Filled 2014-08-22: qty 3

## 2014-08-22 MED ORDER — ONDANSETRON HCL 4 MG/2ML IJ SOLN
4.0000 mg | Freq: Once | INTRAMUSCULAR | Status: AC
  Administered 2014-08-22: 4 mg via INTRAVENOUS
  Filled 2014-08-22: qty 2

## 2014-08-22 MED ORDER — POTASSIUM CHLORIDE CRYS ER 20 MEQ PO TBCR: 20.0000 meq | EXTENDED_RELEASE_TABLET | Freq: Once | ORAL | Status: DC

## 2014-08-22 MED ORDER — SODIUM CHLORIDE 0.9 % IV BOLUS (SEPSIS)
1000.0000 mL | Freq: Once | INTRAVENOUS | Status: AC
  Administered 2014-08-22: 1000 mL via INTRAVENOUS

## 2014-08-22 NOTE — ED Notes (Signed)
I gave the patient a cup of ice and a ginger-ale. 

## 2014-08-22 NOTE — ED Notes (Signed)
Headache, sore throat, body aches, N/V, fever/chills starting yesterday.Tylenol taken at home at 0800.

## 2014-08-22 NOTE — Discharge Instructions (Signed)
Read the information below.  Use the prescribed medication as directed.  Please discuss all new medications with your pharmacist.  Do not take additional tylenol while taking the prescribed pain medication to avoid overdose.  You may return to the Emergency Department at any time for worsening condition or any new symptoms that concern you.  If you develop high fevers, difficulty swallowing or breathing, or you are unable to tolerate fluids by mouth, return to the ER immediately for a recheck.       Pharyngitis Pharyngitis is redness, pain, and swelling (inflammation) of your pharynx.  CAUSES  Pharyngitis is usually caused by infection. Most of the time, these infections are from viruses (viral) and are part of a cold. However, sometimes pharyngitis is caused by bacteria (bacterial). Pharyngitis can also be caused by allergies. Viral pharyngitis may be spread from person to person by coughing, sneezing, and personal items or utensils (cups, forks, spoons, toothbrushes). Bacterial pharyngitis may be spread from person to person by more intimate contact, such as kissing.  SIGNS AND SYMPTOMS  Symptoms of pharyngitis include:   Sore throat.   Tiredness (fatigue).   Low-grade fever.   Headache.  Joint pain and muscle aches.  Skin rashes.  Swollen lymph nodes.  Plaque-like film on throat or tonsils (often seen with bacterial pharyngitis). DIAGNOSIS  Your health care provider will ask you questions about your illness and your symptoms. Your medical history, along with a physical exam, is often all that is needed to diagnose pharyngitis. Sometimes, a rapid strep test is done. Other lab tests may also be done, depending on the suspected cause.  TREATMENT  Viral pharyngitis will usually get better in 3-4 days without the use of medicine. Bacterial pharyngitis is treated with medicines that kill germs (antibiotics).  HOME CARE INSTRUCTIONS   Drink enough water and fluids to keep your urine  clear or pale yellow.   Only take over-the-counter or prescription medicines as directed by your health care provider:   If you are prescribed antibiotics, make sure you finish them even if you start to feel better.   Do not take aspirin.   Get lots of rest.   Gargle with 8 oz of salt water ( tsp of salt per 1 qt of water) as often as every 1-2 hours to soothe your throat.   Throat lozenges (if you are not at risk for choking) or sprays may be used to soothe your throat. SEEK MEDICAL CARE IF:   You have large, tender lumps in your neck.  You have a rash.  You cough up green, yellow-brown, or bloody spit. SEEK IMMEDIATE MEDICAL CARE IF:   Your neck becomes stiff.  You drool or are unable to swallow liquids.  You vomit or are unable to keep medicines or liquids down.  You have severe pain that does not go away with the use of recommended medicines.  You have trouble breathing (not caused by a stuffy nose). MAKE SURE YOU:   Understand these instructions.  Will watch your condition.  Will get help right away if you are not doing well or get worse. Document Released: 08/08/2005 Document Revised: 05/29/2013 Document Reviewed: 04/15/2013 [facility] Patient Information 2015 [place], [place]. This information is not intended to replace advice given to you by your health care provider. Make sure you discuss any questions you have with your health care provider.  Nausea and Vomiting Nausea is a sick feeling that often comes before throwing up (vomiting). Vomiting is a reflex where  stomach contents come out of your mouth. Vomiting can cause severe loss of body fluids (dehydration). Children and elderly adults can become dehydrated quickly, especially if they also have diarrhea. Nausea and vomiting are symptoms of a condition or disease. It is important to find the cause of your symptoms. CAUSES   Direct irritation of the stomach lining. This irritation can result from increased  acid production (gastroesophageal reflux disease), infection, food poisoning, taking certain medicines (such as nonsteroidal anti-inflammatory drugs), alcohol use, or tobacco use.  Signals from the brain.These signals could be caused by a headache, heat exposure, an inner ear disturbance, increased pressure in the brain from injury, infection, a tumor, or a concussion, pain, emotional stimulus, or metabolic problems.  An obstruction in the gastrointestinal tract (bowel obstruction).  Illnesses such as diabetes, hepatitis, gallbladder problems, appendicitis, kidney problems, cancer, sepsis, atypical symptoms of a heart attack, or eating disorders.  Medical treatments such as chemotherapy and radiation.  Receiving medicine that makes you sleep (general anesthetic) during surgery. DIAGNOSIS Your caregiver may ask for tests to be done if the problems do not improve after a few days. Tests may also be done if symptoms are severe or if the reason for the nausea and vomiting is not clear. Tests may include:  Urine tests.  Blood tests.  Stool tests.  Cultures (to look for evidence of infection).  X-rays or other imaging studies. Test results can help your caregiver make decisions about treatment or the need for additional tests. TREATMENT You need to stay well hydrated. Drink frequently but in small amounts.You may wish to drink water, sports drinks, clear broth, or eat frozen ice pops or gelatin dessert to help stay hydrated.When you eat, eating slowly may help prevent nausea.There are also some antinausea medicines that may help prevent nausea. HOME CARE INSTRUCTIONS   Take all medicine as directed by your caregiver.  If you do not have an appetite, do not force yourself to eat. However, you must continue to drink fluids.  If you have an appetite, eat a normal diet unless your caregiver tells you differently.  Eat a variety of complex carbohydrates (rice, wheat, potatoes, bread), lean  meats, yogurt, fruits, and vegetables.  Avoid high-fat foods because they are more difficult to digest.  Drink enough water and fluids to keep your urine clear or pale yellow.  If you are dehydrated, ask your caregiver for specific rehydration instructions. Signs of dehydration may include:  Severe thirst.  Dry lips and mouth.  Dizziness.  Dark urine.  Decreasing urine frequency and amount.  Confusion.  Rapid breathing or pulse. SEEK IMMEDIATE MEDICAL CARE IF:   You have blood or brown flecks (like coffee grounds) in your vomit.  You have black or bloody stools.  You have a severe headache or stiff neck.  You are confused.  You have severe abdominal pain.  You have chest pain or trouble breathing.  You do not urinate at least once every 8 hours.  You develop cold or clammy skin.  You continue to vomit for longer than 24 to 48 hours.  You have a fever. MAKE SURE YOU:   Understand these instructions.  Will watch your condition.  Will get help right away if you are not doing well or get worse. Document Released: 08/08/2005 Document Revised: 10/31/2011 Document Reviewed: 01/05/2011 [facility] Patient Information 2015 [place], [place]. This information is not intended to replace advice given to you by your health care provider. Make sure you discuss any questions you have  with your health care provider.    Emergency Department Resource Guide 1) Find a Doctor and Pay Out of Pocket Although you won't have to find out who is covered by your insurance plan, it is a good idea to ask around and get recommendations. You will then need to call the office and see if the doctor you have chosen will accept you as a new patient and what types of options they offer for patients who are self-pay. Some doctors offer discounts or will set up payment plans for their patients who do not have insurance, but you will need to ask so you aren't surprised when you get to your  appointment.  2) Contact Your Local Health Department Not all health departments have doctors that can see patients for sick visits, but many do, so it is worth a call to see if yours does. If you don't know where your local health department is, you can check in your phone book. The CDC also has a tool to help you locate your state's health department, and many state websites also have listings of all of their local health departments.  3) Find a [facility] If your illness is not likely to be very severe or complicated, you may want to try a walk in clinic. These are popping up all over the country in pharmacies, drugstores, and shopping centers. They're usually staffed by nurse practitioners or physician assistants that have been trained to treat common illnesses and complaints. They're usually fairly quick and inexpensive. However, if you have serious medical issues or chronic medical problems, these are probably not your best option.  No Primary Care Doctor: - Call Health Connect at  [phone] - they can help you locate a primary care doctor that  accepts your insurance, provides certain services, etc. - Physician Referral Service- [phone]  Chronic Pain Problems: Organization         Address  Phone   Notes  [facility]  [phone] Patients need to be referred by their primary care doctor.   Medication Assistance: Organization         Address  Phone   Notes  [facility] Medication [facility] [address] [place], [place] [address] [phone] --Must be a resident of [facility] -- Must have NO insurance coverage whatsoever (no Medicaid/ Medicare, etc.) -- The pt. MUST have a primary care doctor that directs their care regularly and follows them in the community   Med[facility]ist  [phone]   [employer]  [phone]    Agencies that provide inexpensive medical care: Organization         Address  Phone   Notes  [place]  [phone]   [facility]    [phone]   [facility] [place], [place] [address] [phone]   [place] [address], [place] [phone]   Planned Parenthood    [phone]   [facility]    [phone]   [place] and [place] [address], [place] Phone:  [phone], Fax:  [phone] Hours of Operation:  9 am - 6 pm, M-F.  Also accepts Medicaid/Medicare and self-pay.  [facility] for [place] [place], [address], [place] Phone: [phone], Fax: [phone]. Hours of Operation:  8:30 am - 5:30 pm, M-F.  Also  accepts Medicaid and self-pay.  [facility] [place] [address] [place] Phone: [phone]   [place], [place], [place] [phone], Ext. [phone] Mondays & Thursdays: 7-9 AM.  First 15 patients are seen on a first come, first serve basis.    [place] Providers:  Organization         Address  Phone   Notes  [facility] [address], [place] [phone] Also accepts self-pay patients.  [facility] [address], [place]  [phone]   [place], [address], [place] [phone]   [facility] [address] [place] [phone]   Lucianne Lei [address], [place]   [phone] Only accepts [place] Access [place] patients after they have their name applied to their card.   Self-Pay (no insurance) in [facility]:  Organization         Address  Phone   Notes  Sickle Cell Patients, [facility] [place] [phone]   [facility] [place] [phone]   [facility]  [place]  [place], [place], [place] [phone]   [facility]/Dr. Osei-Bonsu  [address] [place] or [address], [place] [phone] Phone number for both [place] and [place] locations is the same.  Urgent Medical [facility] [address], [place] [phone]   [facility] [address] [place] or [address] [phone]   [facility] [address] [place] [phone], phone; [phone], fax Sees patients 1st and 3rd Saturday of every month.  Must not qualify for public or private insurance (i.e. Medicaid, Medicare, [place] Health Choice, Veterans' Benefits)  Household income should be no more than 200% of the poverty level The clinic cannot treat you if you are pregnant or think you are pregnant  Sexually transmitted diseases are not treated at the clinic.    Dental Care: Organization         Address  Phone  Notes  [facility] Department of [facility] [place] [phone] Accepts children up to age 54 who are enrolled in [place] or [place]; pregnant women with a Medicaid card; and children who have applied for Medicaid or [place] Health Choice, but were declined, whose parents can pay a reduced fee at time of service.  [facility] of [facility]  [address], [place] [phone] Accepts children up to age 39 who are enrolled in [place] or [place]; pregnant women with a Medicaid card; and children who have applied for Medicaid or [place] Health Choice, but were declined, whose parents can pay a reduced fee at time of service.  [place] Adult Dental Access PROGRAM  [place] [phone] Patients are seen by appointment only. Walk-ins are not accepted. [place] will see patients 65 years of age and  older. Monday - Tuesday (8am-5pm) Most Wednesdays (8:30-5pm) $30 per visit, cash only  [facility] Adult Dental Access PROGRAM  [address], [facility] [phone] Patients are seen by appointment only. Walk-ins are not accepted. [place] will  see patients 73 years of age and older. One Wednesday Evening (Monthly: Volunteer Based).  $30 per visit, cash only  [address]  [phone] for adults; Children under age 35, call Graduate Pediatric Dentistry at [phone]. Children aged 46-14, please call [phone] to request a pediatric application.  Dental services are provided in all areas of dental care including fillings, crowns and bridges, complete and partial dentures, implants, gum treatment, root canals, and extractions. Preventive care is also provided. Treatment is provided to both adults and children. Patients are selected via a lottery and there is often a waiting list.   [facility] [address], [place]  [phone] [email or web address]   [facility] [address] [place], [place] [phone], Ext. [phone] Second and Fourth Thursday of each month, opens at 6:30 AM; Clinic ends at 9 AM.  Patients are seen on a first-come first-served basis, and a limited number are seen during each clinic.   [facility]  [address] Hillard Danker [place], [place] [phone]   Eligibility Requirements You must have lived in [place], [place], or [place] counties for at least the last three months.   You cannot be eligible for state or federal sponsored [employer], including [employer], [place], or [employer].   You generally cannot be eligible for healthcare insurance through your employer.    How to apply: Eligibility screenings are held every Tuesday and Wednesday afternoon from 1:00 pm until 4:00 pm. You do not need an appointment for the interview!  [facility] [address],  [place], [place]   [place]  [place] Department  Quasim Doyon [place]  [phone]    Behavioral Health Resources in the Community: Intensive Outpatient Programs Organization         Address  Phone  Notes  [place]. [address], [place], [place] [phone]   [facility] Outpatient [address] [place], [place]   ADS: Alcohol & Drug Svcs [address], [place], [place]   [place] [address]  [place], [place] or [phone]   Substance Abuse Resources Organization         Address  Phone  Notes  [facility] Drug Services  [phone]   [place]  [phone]   [facility]   Chinita Pester  [phone]   Residential & Outpatient Substance Abuse Program  [phone]   Psychological Services Organization         Address  Phone  Notes  [facility] [place]  [place]  [phone]   [place] [address] [place] or [phone]    Mobile Crisis Teams Organization         Address  Phone  Notes  [facility], Mobile Crisis Care Unit  [phone]   [facility]  [address] [place], [place]   [place] [address], [place] [phone]    Self-Help/Support Groups Organization         Address  Phone             Notes  [place]. of [place] - variety of support groups  [place] Call for more information  Narcotics Anonymous (NA), [facility] [address], [employer]  [place]  2 meetings at this location   Residential Treatment Programs Organization         Address  Phone  Notes  [facility] [address],    [place]  [phone]   [facility]  [address] [place] [address], [place], [place]   [place], [place] [phone] Admissions: 8am-3pm M-F  Incentives Substance [place] [address]    [place], [place] [phone]   [facility] [address] [place], [place], [place]   [facility] [address]  [place], [place]   [facility] - Intensive Outpatient [address] Kristeen [address], [place], [place]   [facility] ([place].,  [place], [place] [phone] or [phone]   [facility]idential Treatment Services (RTS) [address], [place], [place] Accepts Medicaid  [facility] [place] [address]  [place] [phone] Substance Abuse/Addiction Treatment   [facility] Organization         Address  Phone  Notes  [facility]  [phone]   Domenic Schwab, PhD [address] Arlis [place], [place]   [phone] or [phone]   [place], [place] [phone]   [facility] [address], [place], [place] [phone] Insurance/Medicaid/sponsorship through [facility] [address] [place]                                    [place], [place] [phone] [place] [address], [place] [phone]    Dr. Adele Schilder  [phone]   [facility] Dept. 1) [address], [place] 2) [place] 3)  [address], [place] [phone]  [phone]   [place] [phone] or [phone] (After Hours)

## 2014-08-22 NOTE — ED Provider Notes (Signed)
CSN: [id number]     Arrival date & time 08/22/14  1035 History   First MD Initiated Contact with Patient 08/22/14 1037     Chief Complaint  Patient presents with  . Fever  . Sore Throat  . Emesis     (Consider location/radiation/quality/duration/timing/severity/associated sxs/prior Treatment) The history is provided by the patient.     Pt with hx HTN, asthma p/w  fever, sore throat, body aches, N/V, increased urination that began yesterda.  Notes she is breathing hard, has mild abdominal soreness.  Has been taking ibuprofen and motrin without relief.  Fevers to 100-101 at home.  Sore throat is described as constant pain, worse with swallowing.  Has had sick contacts at work.   Past Medical History  Diagnosis Date  . Asthma   . Hypertension   . Obesity    Past Surgical History  Procedure Laterality Date  . Tubal ligation     History reviewed. No pertinent family history. History  Substance Use Topics  . Smoking status: Former Research scientist (life sciences)  . Smokeless tobacco: Not on file  . Alcohol Use: Yes   OB History    No data available     Review of Systems  All other systems reviewed and are negative.     Allergies  Sulfa antibiotics; Aspirin; and Erythromycin  Home Medications   Prior to Admission medications   Medication Sig Start Date End Date Taking? Authorizing Provider  albuterol (PROVENTIL HFA;VENTOLIN HFA) 108 (90 BASE) MCG/ACT inhaler Inhale 1-2 puffs into the lungs every 6 (six) hours as needed for wheezing or shortness of breath.    Historical Provider, MD  benzonatate (TESSALON) 100 MG capsule Take 1 capsule (100 mg total) by mouth every 8 (eight) hours. 11/13/13   Domenic Moras, PA-C  guaiFENesin (ROBITUSSIN) 100 MG/5ML liquid Take 5-10 mLs (100-200 mg total) by mouth every 4 (four) hours as needed for cough. 11/13/13   Domenic Moras, PA-C  hydrochlorothiazide (HYDRODIURIL) 25 MG tablet Take 1 tablet (25 mg total) by mouth daily. 11/13/13   Domenic Moras, PA-C  predniSONE  (DELTASONE) 20 MG tablet 3 tabs po day one, then 2 tabs daily x 4 days 11/13/13   Domenic Moras, PA-C   BP 173/95 mmHg  Pulse 84  Temp(Src) 98.3 F (36.8 C) (Oral)  Resp 18  Ht 5\' 6"  (1.676 m)  Wt 235 lb (106.595 kg)  BMI 37.95 kg/m2  SpO2 100% Physical Exam  Constitutional: She appears well-developed and well-nourished. No distress.  HENT:  Head: Normocephalic and atraumatic.  Mouth/Throat: Uvula is midline. Mucous membranes are not dry. Oropharyngeal exudate, posterior oropharyngeal edema and posterior oropharyngeal erythema present. No tonsillar abscesses.  Eyes: Conjunctivae are normal.  Neck: Neck supple.  Cardiovascular: Normal rate and regular rhythm.   Pulmonary/Chest: Effort normal. No stridor. No respiratory distress. She has wheezes. She has no rales.  Abdominal: Soft. She exhibits no distension. There is generalized tenderness. There is no rebound and no guarding.  Lymphadenopathy:    She has cervical adenopathy.  Neurological: She is alert.  Skin: She is diaphoretic.  Nursing note and vitals reviewed.   ED Course  Procedures (including critical care time) Labs Review Labs Reviewed  URINALYSIS, ROUTINE W REFLEX MICROSCOPIC - Abnormal; Notable for the following:    Color, Urine ORANGE (*)    APPearance CLOUDY (*)    Hgb urine dipstick MODERATE (*)    Bilirubin Urine SMALL (*)    Ketones, ur 15 (*)    Protein, ur 30 (*)  Urobilinogen, UA 2.0 (*)    Leukocytes, UA SMALL (*)    All other components within normal limits  URINE MICROSCOPIC-ADD ON - Abnormal; Notable for the following:    Squamous Epithelial / LPF MANY (*)    Bacteria, UA FEW (*)    All other components within normal limits  I-STAT CHEM 8, ED - Abnormal; Notable for the following:    Potassium 3.2 (*)    Glucose, Bld 168 (*)    Calcium, Ion 1.08 (*)    Hemoglobin 17.3 (*)    HCT 51.0 (*)    All other components within normal limits  PREGNANCY, URINE    Imaging Review No results found.    EKG Interpretation None      Will treat for strep throat per [facility] Criteria.    2:15 PM Pt feeling much better.  States she is ready to go home.  Lungs CTAB.  Lower abdomen mildly tender without guarding or rebound.  Possibly due to frequent vomiting.    MDM   Final diagnoses:  Pharyngitis  Non-intractable vomiting with nausea, vomiting of unspecified type  Hypokalemia  Mild dehydration   Afebrile, nontoxic patient with sore throat, hx fever, anterior cervical lymphadenopathy, absence of cough.  Also with N/V.  Treated for strep per centor criteria.  Also mildly dehydrated per labs and UA.   IVF, pain and nausea medication, IM antibiotics given.  Pt appearing much more comfortable, tolerating ice chips - pt requested d/c home with slow increase in PO intake.  Reexamination of abdomen remained benign.  Discharged home with supportive care, PCP follow up.  Discussed result, findings, treatment, and follow up  with patient.  Pt given return precautions.  Pt verbalizes understanding and agrees with plan.      Clayton Bibles, PA-C 08/22/14 [place], MD 08/22/14 [phone]

## 2015-02-16 ENCOUNTER — Encounter (HOSPITAL_COMMUNITY): Payer: Self-pay | Admitting: Family Medicine

## 2015-02-16 ENCOUNTER — Emergency Department (HOSPITAL_COMMUNITY)
Admission: EM | Admit: 2015-02-16 | Discharge: 2015-02-16 | Disposition: A | Discharge status: Home / Self Care | Payer: Self-pay | Attending: Emergency Medicine | Admitting: Emergency Medicine

## 2015-02-16 DIAGNOSIS — Z79899 Other long term (current) drug therapy: Secondary | ICD-10-CM | POA: Insufficient documentation

## 2015-02-16 DIAGNOSIS — Z3202 Encounter for pregnancy test, result negative: Secondary | ICD-10-CM | POA: Insufficient documentation

## 2015-02-16 DIAGNOSIS — E669 Obesity, unspecified: Secondary | ICD-10-CM | POA: Insufficient documentation

## 2015-02-16 DIAGNOSIS — Z87891 Personal history of nicotine dependence: Secondary | ICD-10-CM | POA: Insufficient documentation

## 2015-02-16 DIAGNOSIS — R112 Nausea with vomiting, unspecified: Secondary | ICD-10-CM | POA: Insufficient documentation

## 2015-02-16 DIAGNOSIS — I10 Essential (primary) hypertension: Secondary | ICD-10-CM | POA: Insufficient documentation

## 2015-02-16 DIAGNOSIS — J45909 Unspecified asthma, uncomplicated: Secondary | ICD-10-CM | POA: Insufficient documentation

## 2015-02-16 LAB — COMPREHENSIVE METABOLIC PANEL
ALT: 26 U/L (ref 14–54)
AST: 24 U/L (ref 15–41)
Albumin: 3.6 g/dL (ref 3.5–5.0)
Alkaline Phosphatase: 73 U/L (ref 38–126)
Anion gap: 8 (ref 5–15)
BUN: 8 mg/dL (ref 6–20)
CHLORIDE: 104 mmol/L (ref 101–111)
CO2: 26 mmol/L (ref 22–32)
Calcium: 8.7 mg/dL — ABNORMAL LOW (ref 8.9–10.3)
Creatinine, Ser: 0.79 mg/dL (ref 0.44–1.00)
GFR calc non Af Amer: >60 mL/min (ref >60)
Glucose, Bld: 128 mg/dL — ABNORMAL HIGH (ref 65–99)
POTASSIUM: 3.8 mmol/L (ref 3.5–5.1)
Sodium: 138 mmol/L (ref 135–145)
Total Bilirubin: 1.3 mg/dL — ABNORMAL HIGH (ref 0.3–1.2)
Total Protein: 7.2 g/dL (ref 6.5–8.1)

## 2015-02-16 LAB — CBC WITH DIFFERENTIAL/PLATELET
BASOS ABS: 0 10*3/uL (ref 0.0–0.1)
Basophils Relative: 1 % (ref 0–1)
EOS ABS: 0.1 10*3/uL (ref 0.0–0.7)
Eosinophils Relative: 2 % (ref 0–5)
HCT: 44 % (ref 36.0–46.0)
Hemoglobin: 15.1 g/dL — ABNORMAL HIGH (ref 12.0–15.0)
LYMPHS PCT: 49 % — AB (ref 12–46)
Lymphs Abs: 2.9 10*3/uL (ref 0.7–4.0)
MCH: 30.4 pg (ref 26.0–34.0)
MCHC: 34.3 g/dL (ref 30.0–36.0)
MCV: 88.7 fL (ref 78.0–100.0)
MONO ABS: 0.3 10*3/uL (ref 0.1–1.0)
Monocytes Relative: 5 % (ref 3–12)
Neutro Abs: 2.5 10*3/uL (ref 1.7–7.7)
Neutrophils Relative %: 43 % (ref 43–77)
PLATELETS: 265 10*3/uL (ref 150–400)
RBC: 4.96 MIL/uL (ref 3.87–5.11)
RDW: 13 % (ref 11.5–15.5)
WBC: 5.9 10*3/uL (ref 4.0–10.5)

## 2015-02-16 LAB — URINALYSIS, ROUTINE W REFLEX MICROSCOPIC
Bilirubin Urine: NEGATIVE
Glucose, UA: NEGATIVE mg/dL
Ketones, ur: NEGATIVE mg/dL
Nitrite: NEGATIVE
PROTEIN: NEGATIVE mg/dL
Specific Gravity, Urine: 1.02 (ref 1.005–1.030)
UROBILINOGEN UA: 1 mg/dL (ref 0.0–1.0)
pH: 6 (ref 5.0–8.0)

## 2015-02-16 LAB — URINE MICROSCOPIC-ADD ON

## 2015-02-16 LAB — LIPASE, BLOOD: Lipase: 90 U/L — ABNORMAL HIGH (ref 22–51)

## 2015-02-16 LAB — POC URINE PREG, ED: Preg Test, Ur: NEGATIVE

## 2015-02-16 MED ORDER — KETOROLAC TROMETHAMINE 30 MG/ML IJ SOLN
30.0000 mg | Freq: Once | INTRAMUSCULAR | Status: AC
  Administered 2015-02-16: 30 mg via INTRAVENOUS
  Filled 2015-02-16: qty 1

## 2015-02-16 MED ORDER — ONDANSETRON HCL 4 MG/2ML IJ SOLN
4.0000 mg | Freq: Once | INTRAMUSCULAR | Status: AC
  Administered 2015-02-16: 4 mg via INTRAVENOUS
  Filled 2015-02-16: qty 2

## 2015-02-16 MED ORDER — ONDANSETRON HCL 4 MG PO TABS: 4.0000 mg | ORAL_TABLET | Freq: Three times a day (TID) | ORAL | Status: DC | PRN

## 2015-02-16 MED ORDER — METOCLOPRAMIDE HCL 10 MG PO TABS: 10.0000 mg | ORAL_TABLET | Freq: Four times a day (QID) | ORAL | Status: DC | PRN

## 2015-02-16 MED ORDER — SODIUM CHLORIDE 0.9 % IV BOLUS (SEPSIS)
1000.0000 mL | Freq: Once | INTRAVENOUS | Status: AC
  Administered 2015-02-16: 1000 mL via INTRAVENOUS

## 2015-02-16 MED ORDER — METOCLOPRAMIDE HCL 5 MG/ML IJ SOLN
10.0000 mg | Freq: Once | INTRAMUSCULAR | Status: AC
  Administered 2015-02-16: 10 mg via INTRAVENOUS
  Filled 2015-02-16: qty 2

## 2015-02-16 NOTE — ED Notes (Signed)
Pt drinking fluids.

## 2015-02-16 NOTE — Discharge Instructions (Signed)
Read the information below.  Use the prescribed medication as directed.  Please discuss all new medications with your pharmacist.  You may return to the Emergency Department at any time for worsening condition or any new symptoms that concern you.    If you develop high fevers, worsening abdominal pain, uncontrolled vomiting, or are unable to tolerate fluids by mouth, return to the ER for a recheck.      Nausea and Vomiting Nausea is a sick feeling that often comes before throwing up (vomiting). Vomiting is a reflex where stomach contents come out of your mouth. Vomiting can cause severe loss of body fluids (dehydration). Children and elderly adults can become dehydrated quickly, especially if they also have diarrhea. Nausea and vomiting are symptoms of a condition or disease. It is important to find the cause of your symptoms. CAUSES   Direct irritation of the stomach lining. This irritation can result from increased acid production (gastroesophageal reflux disease), infection, food poisoning, taking certain medicines (such as nonsteroidal anti-inflammatory drugs), alcohol use, or tobacco use.  Signals from the brain.These signals could be caused by a headache, heat exposure, an inner ear disturbance, increased pressure in the brain from injury, infection, a tumor, or a concussion, pain, emotional stimulus, or metabolic problems.  An obstruction in the gastrointestinal tract (bowel obstruction).  Illnesses such as diabetes, hepatitis, gallbladder problems, appendicitis, kidney problems, cancer, sepsis, atypical symptoms of a heart attack, or eating disorders.  Medical treatments such as chemotherapy and radiation.  Receiving medicine that makes you sleep (general anesthetic) during surgery. DIAGNOSIS Your caregiver may ask for tests to be done if the problems do not improve after a few days. Tests may also be done if symptoms are severe or if the reason for the nausea and vomiting is not clear.  Tests may include:  Urine tests.  Blood tests.  Stool tests.  Cultures (to look for evidence of infection).  X-rays or other imaging studies. Test results can help your caregiver make decisions about treatment or the need for additional tests. TREATMENT You need to stay well hydrated. Drink frequently but in small amounts.You may wish to drink water, sports drinks, clear broth, or eat frozen ice pops or gelatin dessert to help stay hydrated.When you eat, eating slowly may help prevent nausea.There are also some antinausea medicines that may help prevent nausea. HOME CARE INSTRUCTIONS   Take all medicine as directed by your caregiver.  If you do not have an appetite, do not force yourself to eat. However, you must continue to drink fluids.  If you have an appetite, eat a normal diet unless your caregiver tells you differently.  Eat a variety of complex carbohydrates (rice, wheat, potatoes, bread), lean meats, yogurt, fruits, and vegetables.  Avoid high-fat foods because they are more difficult to digest.  Drink enough water and fluids to keep your urine clear or pale yellow.  If you are dehydrated, ask your caregiver for specific rehydration instructions. Signs of dehydration may include:  Severe thirst.  Dry lips and mouth.  Dizziness.  Dark urine.  Decreasing urine frequency and amount.  Confusion.  Rapid breathing or pulse. SEEK IMMEDIATE MEDICAL CARE IF:   You have blood or brown flecks (like coffee grounds) in your vomit.  You have black or bloody stools.  You have a severe headache or stiff neck.  You are confused.  You have severe abdominal pain.  You have chest pain or trouble breathing.  You do not urinate at least once every  8 hours.  You develop cold or clammy skin.  You continue to vomit for longer than 24 to 48 hours.  You have a fever. MAKE SURE YOU:   Understand these instructions.  Will watch your condition.  Will get help right  away if you are not doing well or get worse. Document Released: 08/08/2005 Document Revised: 10/31/2011 Document Reviewed: 01/05/2011 [facility] Patient Information 2015 [place], [place]. This information is not intended to replace advice given to you by your health care provider. Make sure you discuss any questions you have with your health care provider.

## 2015-02-16 NOTE — ED Provider Notes (Signed)
CSN: [id number]     Arrival date & time 02/16/15  1204 History   First MD Initiated Contact with Patient 02/16/15 1319     Chief Complaint  Patient presents with  . Abdominal Pain     (Consider location/radiation/quality/duration/timing/severity/associated sxs/prior Treatment) HPI   Pt p/w 3 days N/V, left abdominal burning sensation, change in bowel movements.  She and her friends ate together at a restaurant prior to the symptoms starting and are all sick with similar symptoms.  Denies CP, SOB, urinary or vaginal symptoms.  Denies blood in emesis or stool.  Denies fevers.    Past Medical History  Diagnosis Date  . Asthma   . Hypertension   . Obesity    Past Surgical History  Procedure Laterality Date  . Tubal ligation     History reviewed. No pertinent family history. History  Substance Use Topics  . Smoking status: Former Research scientist (life sciences)  . Smokeless tobacco: Not on file  . Alcohol Use: Yes   OB History    No data available     Review of Systems  All other systems reviewed and are negative.     Allergies  Sulfa antibiotics; Aspirin; and Erythromycin  Home Medications   Prior to Admission medications   Medication Sig Start Date End Date Taking? Authorizing Provider  albuterol (PROVENTIL HFA;VENTOLIN HFA) 108 (90 BASE) MCG/ACT inhaler Inhale 1-2 puffs into the lungs every 6 (six) hours as needed for wheezing or shortness of breath. 08/22/14  Yes Clayton Bibles, PA-C  Tetrahydrozoline HCl (VISINE OP) Apply 2 drops to eye daily.   Yes Historical Provider, MD  benzonatate (TESSALON) 100 MG capsule Take 1 capsule (100 mg total) by mouth every 8 (eight) hours. Patient not taking: Reported on 02/16/2015 11/13/13   Domenic Moras, PA-C  guaiFENesin (ROBITUSSIN) 100 MG/5ML liquid Take 5-10 mLs (100-200 mg total) by mouth every 4 (four) hours as needed for cough. Patient not taking: Reported on 02/16/2015 11/13/13   Domenic Moras, PA-C  hydrochlorothiazide (HYDRODIURIL) 25 MG tablet Take 1 tablet  (25 mg total) by mouth daily. 08/22/14   Clayton Bibles, PA-C  HYDROcodone-acetaminophen (HYCET) 7.5-325 mg/15 ml solution Take 10 mLs by mouth 4 (four) times daily as needed for moderate pain or severe pain. Patient not taking: Reported on 02/16/2015 08/22/14   Clayton Bibles, PA-C  predniSONE (DELTASONE) 20 MG tablet 3 tabs po day one, then 2 tabs daily x 4 days Patient not taking: Reported on 02/16/2015 11/13/13   Domenic Moras, PA-C   BP 148/86 mmHg  Pulse 53  Temp(Src) 98.2 F (36.8 C) (Oral)  Resp 16  Ht 5\' 6"  (1.676 m)  Wt 245 lb 3.2 oz (111.222 kg)  BMI 39.60 kg/m2  SpO2 98% Physical Exam  Constitutional: She appears well-developed and well-nourished. No distress.  HENT:  Head: Normocephalic and atraumatic.  Neck: Neck supple.  Cardiovascular: Normal rate and regular rhythm.   Pulmonary/Chest: Effort normal and breath sounds normal. No respiratory distress. She has no wheezes. She has no rales.  Abdominal: Soft. She exhibits no distension. There is no tenderness. There is no rebound and no guarding.  Obese Diffuse mild tenderness over left lower abdomen  Neurological: She is alert.  Skin: She is not diaphoretic.  Nursing note and vitals reviewed.   ED Course  Procedures (including critical care time) Labs Review Labs Reviewed  CBC WITH DIFFERENTIAL/PLATELET - Abnormal; Notable for the following:    Hemoglobin 15.1 (*)    Lymphocytes Relative 49 (*)  All other components within normal limits  COMPREHENSIVE METABOLIC PANEL - Abnormal; Notable for the following:    Glucose, Bld 128 (*)    Calcium 8.7 (*)    Total Bilirubin 1.3 (*)    All other components within normal limits  LIPASE, BLOOD - Abnormal; Notable for the following:    Lipase 90 (*)    All other components within normal limits  URINALYSIS, ROUTINE W REFLEX MICROSCOPIC (NOT AT [facility]) - Abnormal; Notable for the following:    Hgb urine dipstick TRACE (*)    Leukocytes, UA SMALL (*)    All other components within normal  limits  URINE MICROSCOPIC-ADD ON - Abnormal; Notable for the following:    Squamous Epithelial / LPF FEW (*)    Bacteria, UA FEW (*)    All other components within normal limits  POC URINE PREG, ED    Imaging Review No results found.   EKG Interpretation None      MDM   Final diagnoses:  Nausea and vomiting, vomiting of unspecified type    Afebrile, nontoxic patient with abdominal burning sensation, N/V, change in bowel movements (not diarrhea, but altered) x 3 days after eating in restaurant with friends who also became ill.  Workup unremarkable, does have mild hyperbilirubinemia, mild elevation of lipase, glucose.  Hgb 15, likely due to mild dehydration.  Abdominal exam benign.  IVF, zofran given.  Pt vomiting with first PO trial. I have ordered reglan and have discussed pt with Ottie Glazier, PA-C, who will follow second PO trial and reassess.  Anticipate D/C home with zofran, PCP follow up.      Clayton Bibles, PA-C 02/16/15 1553  Leonard Schwartz, MD 02/17/15 [phone]

## 2015-02-16 NOTE — ED Notes (Addendum)
Pt here for 3 days of stomach pain and sts every time she put something in her mouth she is nauseous and feels like she's going pass out. sts she just ate. sts she has been sick since eating at [employer]. sts intermittent.

## 2015-02-16 NOTE — ED Provider Notes (Signed)
  Physical Exam  BP 148/86 mmHg  Pulse 53  Temp(Src) 98.2 F (36.8 C) (Oral)  Resp 16  Ht 5\' 6"  (1.676 m)  Wt 245 lb 3.2 oz (111.222 kg)  BMI 39.60 kg/m2  SpO2 98%  Physical Exam  ED Course  Procedures Patient signed out to me by Clayton Bibles, PA-C. Patient came in for nausea, vomiting, left abdominal burning for the past 3 days. She states she ate at a restaurant with her friends who all became sick with similar symptoms. The plan was PO challenge the patient and discharge.   Recheck 16:51 Patient is able to hold down water. Patient states she is feeling much better. She has received one bolus of fluids. Will discharge home.       Ottie Glazier, PA-C 02/17/15 1700  Leonard Schwartz, MD 02/17/15 [phone]

## 2016-01-01 ENCOUNTER — Encounter (HOSPITAL_COMMUNITY): Payer: Self-pay | Admitting: Emergency Medicine

## 2016-01-01 ENCOUNTER — Emergency Department (HOSPITAL_COMMUNITY)
Admission: EM | Admit: 2016-01-01 | Discharge: 2016-01-01 | Disposition: A | Discharge status: Home / Self Care | Payer: Self-pay | Attending: Emergency Medicine | Admitting: Emergency Medicine

## 2016-01-01 DIAGNOSIS — Z79899 Other long term (current) drug therapy: Secondary | ICD-10-CM | POA: Insufficient documentation

## 2016-01-01 DIAGNOSIS — I10 Essential (primary) hypertension: Secondary | ICD-10-CM | POA: Insufficient documentation

## 2016-01-01 DIAGNOSIS — J45901 Unspecified asthma with (acute) exacerbation: Secondary | ICD-10-CM | POA: Insufficient documentation

## 2016-01-01 DIAGNOSIS — Z87891 Personal history of nicotine dependence: Secondary | ICD-10-CM | POA: Insufficient documentation

## 2016-01-01 DIAGNOSIS — E669 Obesity, unspecified: Secondary | ICD-10-CM | POA: Insufficient documentation

## 2016-01-01 MED ORDER — ALBUTEROL SULFATE HFA 108 (90 BASE) MCG/ACT IN AERS: 1.0000 | INHALATION_SPRAY | Freq: Four times a day (QID) | RESPIRATORY_TRACT | Status: DC | PRN

## 2016-01-01 MED ORDER — ALBUTEROL SULFATE (2.5 MG/3ML) 0.083% IN NEBU
5.0000 mg | INHALATION_SOLUTION | Freq: Once | RESPIRATORY_TRACT | Status: DC
  Filled 2016-01-01: qty 6

## 2016-01-01 MED ORDER — ALBUTEROL SULFATE (2.5 MG/3ML) 0.083% IN NEBU
5.0000 mg | INHALATION_SOLUTION | Freq: Once | RESPIRATORY_TRACT | Status: AC
  Administered 2016-01-01: 5 mg via RESPIRATORY_TRACT

## 2016-01-01 MED ORDER — ALBUTEROL SULFATE (2.5 MG/3ML) 0.083% IN NEBU
INHALATION_SOLUTION | RESPIRATORY_TRACT | Status: DC
Start: 2016-01-01 — End: 2016-01-01
  Filled 2016-01-01: qty 6

## 2016-01-01 MED ORDER — PREDNISONE 20 MG PO TABS
60.0000 mg | ORAL_TABLET | Freq: Once | ORAL | Status: AC
  Administered 2016-01-01: 60 mg via ORAL
  Filled 2016-01-01: qty 3

## 2016-01-01 MED ORDER — PREDNISONE 20 MG PO TABS: ORAL_TABLET | ORAL | Status: DC

## 2016-01-01 NOTE — Discharge Instructions (Signed)
Please read and follow all provided instructions.  Your diagnoses today include:  1. Asthma exacerbation    Tests performed today includes  Vital signs. See below for your results today.   Medications prescribed:   Take any prescribed medications only as directed.  Home care instructions:  Follow any educational materials contained in this packet.  Follow-up instructions: Please follow-up with your primary care provider in the next 3 days for further evaluation of your symptoms and management of your asthma.  Return instructions:   Please return to the Emergency Department if you experience worsening symptoms.  Please return with worsening wheezing, shortness of breath, or difficulty breathing.  Return with persistent fever above 101F.   Please return if you have any other emergent concerns.  Additional Information:  Your vital signs today were: BP 165/111 mmHg   Pulse 75   Temp(Src) 98.3 F (36.8 C) (Oral)   Resp 18   SpO2 98% If your blood pressure (BP) was elevated above 135/85 this visit, please have this repeated by your doctor within one month. --------------

## 2016-01-01 NOTE — ED Notes (Signed)
Pt has asthma; sats 100% on RA. Has not been taking allergy medicines. States she is congested and cannot breathe. Minimal inspiratory wheezing in upper lobes. Pt is tearful. Rescue inhaler used and not helpful and gave a few treatments at home from nebulizer. States been like this since last Friday.

## 2016-01-01 NOTE — ED Provider Notes (Signed)
CSN: [id number]     Arrival date & time 01/01/16  0756 History   First MD Initiated Contact with Patient 01/01/16 0820     Chief Complaint  Patient presents with  . Asthma   (Consider location/radiation/quality/duration/timing/severity/associated sxs/prior Treatment) HPI 45 y.o. female with a hx of Asthma, presents to the Emergency Department today due to asthma exacerbation. Congested with difficulty breathing today. Symptoms since Friday. Multiple attempts with rescue inhalers and home nebulizer treatments without relief. No CP/ABD pain. No fevers. Notes no pain currently. No headaches. No N/V/D. Received 4 treatments here in ED. Pt notes that she has just gotten insurance so she can establish a PCP.    Past Medical History  Diagnosis Date  . Asthma   . Hypertension   . Obesity    Past Surgical History  Procedure Laterality Date  . Tubal ligation     History reviewed. No pertinent family history. Social History  Substance Use Topics  . Smoking status: Former Research scientist (life sciences)  . Smokeless tobacco: None  . Alcohol Use: Yes   OB History    No data available     Review of Systems  Constitutional: Negative for fever.  HENT: Positive for sinus pressure.   Respiratory: Positive for cough and wheezing.    Allergies  Sulfa antibiotics; Aspirin; and Erythromycin  Home Medications   Prior to Admission medications   Medication Sig Start Date End Date Taking? Authorizing Provider  albuterol (PROVENTIL HFA;VENTOLIN HFA) 108 (90 BASE) MCG/ACT inhaler Inhale 1-2 puffs into the lungs every 6 (six) hours as needed for wheezing or shortness of breath. 08/22/14   Clayton Bibles, PA-C  benzonatate (TESSALON) 100 MG capsule Take 1 capsule (100 mg total) by mouth every 8 (eight) hours. Patient not taking: Reported on 02/16/2015 11/13/13   Domenic Moras, PA-C  guaiFENesin (ROBITUSSIN) 100 MG/5ML liquid Take 5-10 mLs (100-200 mg total) by mouth every 4 (four) hours as needed for cough. Patient not taking:  Reported on 02/16/2015 11/13/13   Domenic Moras, PA-C  hydrochlorothiazide (HYDRODIURIL) 25 MG tablet Take 1 tablet (25 mg total) by mouth daily. 08/22/14   Clayton Bibles, PA-C  HYDROcodone-acetaminophen (HYCET) 7.5-325 mg/15 ml solution Take 10 mLs by mouth 4 (four) times daily as needed for moderate pain or severe pain. Patient not taking: Reported on 02/16/2015 08/22/14   Clayton Bibles, PA-C  metoCLOPramide (REGLAN) 10 MG tablet Take 1 tablet (10 mg total) by mouth every 6 (six) hours as needed for nausea or vomiting. 02/16/15   Clayton Bibles, PA-C  predniSONE (DELTASONE) 20 MG tablet 3 tabs po day one, then 2 tabs daily x 4 days Patient not taking: Reported on 02/16/2015 11/13/13   Domenic Moras, PA-C  Tetrahydrozoline HCl (VISINE OP) Apply 2 drops to eye daily.    Historical Provider, MD   Pulse 76  Temp(Src) 98.3 F (36.8 C) (Oral)  Resp 18  SpO2 100%   Physical Exam  Constitutional: She is oriented to person, place, and time. She appears well-developed and well-nourished.  HENT:  Head: Normocephalic and atraumatic.  Eyes: EOM are normal.  Neck: Normal range of motion. Neck supple. No tracheal deviation present.  Cardiovascular: Normal rate, regular rhythm and intact distal pulses.   No murmur heard. Pulmonary/Chest: Effort normal and breath sounds normal. No respiratory distress. She has no wheezes. She exhibits no tenderness.  Abdominal: Soft. There is no tenderness.  Musculoskeletal: Normal range of motion.  Neurological: She is alert and oriented to person, place, and time.  Skin: Skin  is warm and dry.  Psychiatric: She has a normal mood and affect. Her behavior is normal. Thought content normal.  Nursing note and vitals reviewed.  ED Course  Procedures (including critical care time) Labs Review Labs Reviewed - No data to display  Imaging Review No results found. I have personally reviewed and evaluated these images and lab results as part of my medical decision-making.   EKG  Interpretation None      MDM  I have reviewed the relevant previous healthcare records. I obtained HPI from historian.  ED Course:  Assessment: Pt is a 44yF with hx Asthma who presents with acute asthma exacerbation. Most likely brought on by allergies. On exam, pt in NAD. Nontoxic/nonseptic appearing. VSS. Afebrile. Lungs CTA. Heart RRR. Given albuterol treatment in ED along with prednisone. Plan is to DC home with prednisone and refill inhaler. Counseled patient on OTC allergy medications. At time of discharge, Patient is in no acute distress. Vital Signs are stable. Patient is able to ambulate. Patient able to tolerate PO.    Disposition/Plan:  DC Home Additional Verbal discharge instructions given and discussed with patient.  Pt Instructed to f/u with PCP in the next week for evaluation and treatment of symptoms. Return precautions given Pt acknowledges and agrees with plan  Supervising Physician Davonna Belling, MD   Final diagnoses:  Asthma exacerbation     Shary Decamp, PA-C 01/01/16 [id number]  Davonna Belling, MD 01/01/16 1525

## 2016-03-03 ENCOUNTER — Emergency Department (HOSPITAL_COMMUNITY)
Admission: EM | Admit: 2016-03-03 | Discharge: 2016-03-03 | Disposition: A | Discharge status: Home / Self Care | Payer: Self-pay | Attending: Emergency Medicine | Admitting: Emergency Medicine

## 2016-03-03 ENCOUNTER — Encounter (HOSPITAL_COMMUNITY): Payer: Self-pay | Admitting: *Deleted

## 2016-03-03 DIAGNOSIS — J45909 Unspecified asthma, uncomplicated: Secondary | ICD-10-CM | POA: Insufficient documentation

## 2016-03-03 DIAGNOSIS — I10 Essential (primary) hypertension: Secondary | ICD-10-CM | POA: Insufficient documentation

## 2016-03-03 DIAGNOSIS — Z79899 Other long term (current) drug therapy: Secondary | ICD-10-CM | POA: Insufficient documentation

## 2016-03-03 DIAGNOSIS — J029 Acute pharyngitis, unspecified: Secondary | ICD-10-CM | POA: Insufficient documentation

## 2016-03-03 DIAGNOSIS — Z87891 Personal history of nicotine dependence: Secondary | ICD-10-CM | POA: Insufficient documentation

## 2016-03-03 LAB — RAPID STREP SCREEN (MED CTR MEBANE ONLY): Streptococcus, Group A Screen (Direct): NEGATIVE

## 2016-03-03 MED ORDER — PENICILLIN G BENZATHINE 1200000 UNIT/2ML IM SUSP
1.2000 10*6.[IU] | Freq: Once | INTRAMUSCULAR | Status: AC
  Administered 2016-03-03: 1.2 10*6.[IU] via INTRAMUSCULAR
  Filled 2016-03-03: qty 2

## 2016-03-03 MED ORDER — ACETAMINOPHEN 325 MG PO TABS
ORAL_TABLET | ORAL | Status: AC
  Filled 2016-03-03: qty 2

## 2016-03-03 MED ORDER — DEXAMETHASONE SODIUM PHOSPHATE 10 MG/ML IJ SOLN
10.0000 mg | Freq: Once | INTRAMUSCULAR | Status: AC
  Administered 2016-03-03: 10 mg via INTRAMUSCULAR
  Filled 2016-03-03: qty 1

## 2016-03-03 MED ORDER — ACETAMINOPHEN 325 MG PO TABS
650.0000 mg | ORAL_TABLET | Freq: Once | ORAL | Status: AC | PRN
  Administered 2016-03-03: 650 mg via ORAL

## 2016-03-03 NOTE — ED Notes (Signed)
Pt c/o sore throat since this morning.

## 2016-03-03 NOTE — ED Provider Notes (Signed)
CSN: [id number]     Arrival date & time 03/03/16  1950 History  By signing my name below, I, Emmanuella Mensah, attest that this documentation has been prepared under the direction and in the presence of [employer], PA-C . Electronically Signed: Judithann Sauger, ED Scribe. 03/03/2016. 10:10 PM.    Chief Complaint  Patient presents with  . Sore Throat   The history is provided by the patient. No language interpreter was used.   HPI Comments: Angela Burton is a 45 y.o. female with a hx of hypertension who presents to the Emergency Department complaining of gradually worsening moderate sore throat onset this am. She reports associated trouble swallowing, chills, and fatigue. No alleviating factors noted. Pt states that she has tried Ibuprofen and Tylenol with no relief. No sick contacts noted. She reports a hx of recurrent strep throat and these symptoms are consistent with her strep throat. She adds that she has been advised to have a tonsillectomy but pt states that she has reservations about the procedure. She denies any cough, abdominal pain, n/v, or dysuria.    Past Medical History  Diagnosis Date  . Asthma   . Hypertension   . Obesity    Past Surgical History  Procedure Laterality Date  . Tubal ligation     No family history on file. Social History  Substance Use Topics  . Smoking status: Former Research scientist (life sciences)  . Smokeless tobacco: None  . Alcohol Use: Yes   OB History    No data available     Review of Systems  A complete 10 system review of systems was obtained and all systems are negative except as noted in the HPI and PMH.    Allergies  Sulfa antibiotics; Aspirin; and Erythromycin  Home Medications   Prior to Admission medications   Medication Sig Start Date End Date Taking? Authorizing Provider  albuterol (PROVENTIL HFA;VENTOLIN HFA) 108 (90 Base) MCG/ACT inhaler Inhale 1-2 puffs into the lungs every 6 (six) hours as needed for wheezing or shortness of breath.  01/01/16   Shary Decamp, PA-C  benzonatate (TESSALON) 100 MG capsule Take 1 capsule (100 mg total) by mouth every 8 (eight) hours. Patient not taking: Reported on 02/16/2015 11/13/13   Domenic Moras, PA-C  guaiFENesin (ROBITUSSIN) 100 MG/5ML liquid Take 5-10 mLs (100-200 mg total) by mouth every 4 (four) hours as needed for cough. Patient not taking: Reported on 02/16/2015 11/13/13   Domenic Moras, PA-C  hydrochlorothiazide (HYDRODIURIL) 25 MG tablet Take 1 tablet (25 mg total) by mouth daily. 08/22/14   Clayton Bibles, PA-C  HYDROcodone-acetaminophen (HYCET) 7.5-325 mg/15 ml solution Take 10 mLs by mouth 4 (four) times daily as needed for moderate pain or severe pain. Patient not taking: Reported on 02/16/2015 08/22/14   Clayton Bibles, PA-C  metoCLOPramide (REGLAN) 10 MG tablet Take 1 tablet (10 mg total) by mouth every 6 (six) hours as needed for nausea or vomiting. 02/16/15   Clayton Bibles, PA-C  predniSONE (DELTASONE) 20 MG tablet 3 tabs po day one, then 2 tabs daily x 4 days 01/01/16   Shary Decamp, PA-C  Tetrahydrozoline HCl (VISINE OP) Apply 2 drops to eye daily.    Historical Provider, MD   BP 157/89 mmHg  Pulse 97  Temp(Src) 100.2 F (37.9 C) (Oral)  Resp 20  SpO2 95% Physical Exam  Constitutional: She is oriented to person, place, and time. She appears well-developed and well-nourished.  HENT:  Head: Normocephalic and atraumatic.  Right Ear: External ear normal.  Left  Ear: External ear normal.  Mouth/Throat: Oropharynx is clear and moist. No oropharyngeal exudate.  No drooling or stridor. Posterior pharynx injected, tonsillar hypertrophy 2+ bilaterally with exudate. Soft palate rises symmetrically. No TTP or induration under tongue.   Bilateral anterior cervical lymphadenopathy ++ tender.  No tenderness to palpation of frontal or bilateral maxillary sinuses.  Mild mucosal edema in the nares with scant rhinorrhea.  Bilateral tympanic membranes with normal architecture and good light reflex.    Eyes:  Conjunctivae and EOM are normal. Pupils are equal, round, and reactive to light.  Neck: Normal range of motion. Neck supple.  Cardiovascular: Normal rate and regular rhythm.   Pulmonary/Chest: Effort normal and breath sounds normal. No stridor. No respiratory distress. She has no wheezes. She has no rales. She exhibits no tenderness.  Abdominal: Soft. There is no tenderness. There is no rebound and no guarding.  Neurological: She is alert and oriented to person, place, and time.  Skin: Skin is warm and dry.  Psychiatric: She has a normal mood and affect.  Nursing note and vitals reviewed.   ED Course  Procedures (including critical care time) DIAGNOSTIC STUDIES: Oxygen Saturation is 94% on RA, normal by my interpretation.    COORDINATION OF CARE: 10:07 PM- Pt advised of plan for treatment and pt agrees. Pt informed of her strep test results. She will receive Penicillin and Decadron. Advised to regularly alternate Motrin and Tylenol. Assured pt that tonsillectomy is safe and recommended to get procedure done.    Labs Review Labs Reviewed  RAPID STREP SCREEN (NOT AT [facility])  CULTURE, GROUP A STREP [facility])   Monico Blitz, PA-C has personally reviewed and evaluated these lab results as part of her medical decision-making.  MDM   Final diagnoses:  Pharyngitis    Filed Vitals:   03/03/16 2012 03/03/16 2139  BP: 157/96 157/89  Pulse: 95 97  Temp: 102.2 F (39 C) 100.2 F (37.9 C)  TempSrc: Oral Oral  Resp: 18 20  SpO2: 94% 95%    Medications  acetaminophen (TYLENOL) tablet 650 mg (650 mg Oral Given 03/03/16 2017)  penicillin g benzathine (BICILLIN LA) 1200000 UNIT/2ML injection 1.2 Million Units (1.2 Million Units Intramuscular Given 03/03/16 2214)  dexamethasone (DECADRON) injection 10 mg (10 mg Intramuscular Given 03/03/16 2214)    Angela Burton is 45 y.o. female presenting with Sore throat onset this morning, febrile to 102.2, overall nontoxic appearing, physical exam is  consistent with a strep throat and patient states she gets frequent strep exacerbations. She has been advised that she will need to have a tonsillectomy but she has declined this because she's afraid of the anesthesia. Rapid strep is negative however, given this patient's history of it is reasonable to treat her for a bacterial strep pharyngitis. Patient given the option of pills versus Bicillin, she opts for shot.  Evaluation does not show pathology that would require ongoing emergent intervention or inpatient treatment. Pt is hemodynamically stable and mentating appropriately. Discussed findings and plan with patient/guardian, who agrees with care plan. All questions answered. Return precautions discussed and outpatient follow up given.    Monico Blitz, PA-C 03/03/16 2239  Veryl Speak, MD 03/03/16 [phone]

## 2016-03-03 NOTE — Discharge Instructions (Signed)
For fever and pain control, please take Ibuprofen (also known as Motrin or Advil) 400mg  (this is normally 2 over the counter pills)  and 4 hours later take 2 acetaminophen pills (digits are normally aerated and 25 mg apiece).  Push fluids, it is important that he stay hydrated and fight the infection.  Do not hesitate to return to the emergency room for any new, worsening or concerning symptoms.  Please obtain primary care using resource guide below. Let them know that you were seen in the emergency room and that they will need to obtain records for further outpatient management.    Strep Throat Strep throat is a bacterial infection of the throat. Your health care provider may call the infection tonsillitis or pharyngitis, depending on whether there is swelling in the tonsils or at the back of the throat. Strep throat is most common during the cold months of the year in children who are 24-60 years of age, but it can happen during any season in people of any age. This infection is spread from person to person (contagious) through coughing, sneezing, or close contact. CAUSES Strep throat is caused by the bacteria called Streptococcus pyogenes. RISK FACTORS This condition is more likely to develop in:  People who spend time in crowded places where the infection can spread easily.  People who have close contact with someone who has strep throat. SYMPTOMS Symptoms of this condition include:  Fever or chills.   Redness, swelling, or pain in the tonsils or throat.  Pain or difficulty when swallowing.  White or yellow spots on the tonsils or throat.  Swollen, tender glands in the neck or under the jaw.  Red rash all over the body (rare). DIAGNOSIS This condition is diagnosed by performing a rapid strep test or by taking a swab of your throat (throat culture test). Results from a rapid strep test are usually ready in a few minutes, but throat culture test results are available after one or  two days. TREATMENT This condition is treated with antibiotic medicine. HOME CARE INSTRUCTIONS Medicines  Take over-the-counter and prescription medicines only as told by your health care provider.  Take your antibiotic as told by your health care provider. Do not stop taking the antibiotic even if you start to feel better.  Have family members who also have a sore throat or fever tested for strep throat. They may need antibiotics if they have the strep infection. Eating and Drinking  Do not share food, drinking cups, or personal items that could cause the infection to spread to other people.  If swallowing is difficult, try eating soft foods until your sore throat feels better.  Drink enough fluid to keep your urine clear or pale yellow. General Instructions  Gargle with a salt-water mixture 3-4 times per day or as needed. To make a salt-water mixture, completely dissolve -1 tsp of salt in 1 cup of warm water.  Make sure that all household members wash their hands well.  Get plenty of rest.  Stay home from school or work until you have been taking antibiotics for 24 hours.  Keep all follow-up visits as told by your health care provider. This is important. SEEK MEDICAL CARE IF:  The glands in your neck continue to get bigger.  You develop a rash, cough, or earache.  You cough up a thick liquid that is green, yellow-brown, or bloody.  You have pain or discomfort that does not get better with medicine.  Your problems seem to  be getting worse rather than better.  You have a fever. SEEK IMMEDIATE MEDICAL CARE IF:  You have new symptoms, such as vomiting, severe headache, stiff or painful neck, chest pain, or shortness of breath.  You have severe throat pain, drooling, or changes in your voice.  You have swelling of the neck, or the skin on the neck becomes red and tender.  You have signs of dehydration, such as fatigue, dry mouth, and decreased urination.  You become  increasingly sleepy, or you cannot wake up completely.  Your joints become red or painful.   This information is not intended to replace advice given to you by your health care provider. Make sure you discuss any questions you have with your health care provider.   Document Released: 08/05/2000 Document Revised: 04/29/2015 Document Reviewed: 12/01/2014 Elsevier Interactive Patient Education 2016 [place] [employer] 211 is a great source of information about community services available.  Access by dialing 2-1-1 from anywhere in [place], or by website -  CustodianSupply.fi.   Other Local Resources (Updated 08/2015)  [place]    Phone Number and Address  [place] medical care - 1st and 3rd Saturday of every month  Must not qualify for public or private insurance and must have limited income [phone] [address], [place]  Child care  Emergency assistance for housing and [employer]  Medicaid [phone] [address], [place] [address]   [facility] Department  Low-cost medical care for children, communicable diseases, sexually-transmitted diseases, immunizations, maternity care, womens health and family planning [phone] [address], [place] [address]  [facility]   Medication assistance for [facility] residents  Must meet income requirements [phone] [place], [place].    [place]  Child care  Emergency assistance for housing and [employer]  Medicaid [id number] [address] [place], [place] [address]  [facility] [place]   Low-cost medical care,   Monday through Friday, 9 am to 6 pm.   Accepts  Medicare/Medicaid, and self-pay [phone] [address] [place], [place] [address]  [facility] for [place] care - Monday through Friday, 8:30 am - 5:30 pm  Accepts Medicaid and self-pay [phone] [address] [place], [place] [address]   [facility]  Primary medical care, including for those with sickle cell disease  Accepts Medicare, Medicaid, insurance and self-pay [phone], [place]  [facility]   Primary medical care  Accepts Medicare, [place], insurance and self-pay [phone] [place], [place] [address]   [facility] Department of Social Services  Child care  Emergency assistance for housing and [employer]  Medicaid [phone] [address] [place], [place] [address]  [place] Department of Health and [employer]  Child care  Emergency assistance for housing and [employer]  Medicaid [phone] [place], [place] [address]   [facility] Medication Assistance Program  Medication assistance for [facility] residents with no insurance only  Must have a primary care doctor [phone] [address] [place], [place], [facility]   Primary medical care  Accepts Medicare, [place], insurance  [address] [place], [place]  [facility]   Medication assistance [phone]  [facility]   Primary medical care  Accepts Medicare, [place], insurance and self-pay [phone] [address] [place], [place] [address]  [facility]   Primary medical care  Accepts Medicare, [place], insurance and self-pay [phone] [address], [place] [address]  [facility]  For [place] residents between the ages of 64 and 30 who do not have any  form of health insurance, Medicare, [place], or [place] benefits.  Services are provided free of charge to uninsured patients who fall within federal poverty guidelines.    Hours: Tuesdays and Thursdays, 4:15 - 8 pm [phone] [address], [place], [place] [address]  [facility]     Primary medical care  Dental care  Nutritional counseling  Pharmacy  Accepts Medicaid, Medicare, most insurance.  Fees are adjusted based on ability to pay.   [place], [place] [address], [place], [place] [facility], [place], [place] [facility], [place]  [facility]  Womens health and family planning [phone] [place]. [place], [place] care  Emergency assistance for housing and [employer]  Medicaid [phone] [address] [place], [place] [address]   [facility]    Ages 35 and older  Hours: Mondays and Thursdays, 7:00 am - 9:00 am Patients are seen on a first come, first served basis. [phone], ext. [place], [place]  Child care  Emergency assistance for housing and [employer]  Medicaid [phone] [address], [place] [address]  [facility]  Medication assistance  Rental assistance  Food pantry  Medication assistance  Housing assistance  Emergency food distribution  Utility assistance [place], [place]  [place]. [place], [place] [address] Hours: Tuesdays and Thursdays from 9am - 12  noon by appointment only  [facility] [place],  [address]  [facility] [place] private insurance, [place], and [place].  Payment is based on a sliding scale for those without insurance.  Hours: Mondays, Tuesdays and Thursdays, 8:30 am - 5:30 pm.   [phone] [place], [place]  [facility] - Family Medicine at [facility], [place], and [place].  Payment is based on a sliding scale for those without insurance. [phone] [address] [place], [place]  [facility] - Pediatrics at [address] Scientist, research (medical), [employer], and [place].  Payment is based on a sliding scale for those without insurance [phone] [address], [employer], [place]  [facility] - Pediatrics at [employer], [place], and [place].  Payment is based on a sliding scale for those without insurance. [phone] [place], [place]  [facility] - Pediatrics at [facility], [place], and [place].  Payment is based on a sliding scale for those without insurance. [phone], ext. [phone]  [address] [place], [place].    [place] care.  Accepts Medicaid and self-pay. [place], [place]

## 2016-03-03 NOTE — ED Notes (Signed)
Patient denies any pain at the injection site of the intramuscular injections.

## 2016-03-03 NOTE — ED Notes (Signed)
Patient Alert and oriented X4. Stable and ambulatory. Patient verbalized understanding of the discharge instructions.  Patient belongings were taken by the patient.  

## 2016-03-04 DIAGNOSIS — J45909 Unspecified asthma, uncomplicated: Secondary | ICD-10-CM | POA: Insufficient documentation

## 2016-03-07 LAB — CULTURE, GROUP A STREP (THRC)

## 2016-04-05 DIAGNOSIS — R0683 Snoring: Secondary | ICD-10-CM | POA: Insufficient documentation

## 2016-05-31 ENCOUNTER — Ambulatory Visit (HOSPITAL_BASED_OUTPATIENT_CLINIC_OR_DEPARTMENT_OTHER): Payer: Self-pay | Attending: Otolaryngology

## 2016-06-02 ENCOUNTER — Encounter (HOSPITAL_COMMUNITY): Payer: Self-pay | Admitting: Emergency Medicine

## 2016-06-02 ENCOUNTER — Emergency Department (HOSPITAL_COMMUNITY): Payer: Commercial Managed Care - PPO

## 2016-06-02 ENCOUNTER — Emergency Department (HOSPITAL_COMMUNITY)
Admission: EM | Admit: 2016-06-02 | Discharge: 2016-06-02 | Disposition: A | Discharge status: Home / Self Care | Payer: Commercial Managed Care - PPO | Attending: Emergency Medicine | Admitting: Emergency Medicine

## 2016-06-02 DIAGNOSIS — J069 Acute upper respiratory infection, unspecified: Secondary | ICD-10-CM | POA: Insufficient documentation

## 2016-06-02 DIAGNOSIS — B9789 Other viral agents as the cause of diseases classified elsewhere: Secondary | ICD-10-CM

## 2016-06-02 DIAGNOSIS — Z87891 Personal history of nicotine dependence: Secondary | ICD-10-CM | POA: Insufficient documentation

## 2016-06-02 DIAGNOSIS — J4541 Moderate persistent asthma with (acute) exacerbation: Secondary | ICD-10-CM

## 2016-06-02 DIAGNOSIS — I10 Essential (primary) hypertension: Secondary | ICD-10-CM | POA: Insufficient documentation

## 2016-06-02 DIAGNOSIS — R0602 Shortness of breath: Secondary | ICD-10-CM | POA: Diagnosis present

## 2016-06-02 LAB — BASIC METABOLIC PANEL
ANION GAP: 8 (ref 5–15)
BUN: 6 mg/dL (ref 6–20)
CHLORIDE: 104 mmol/L (ref 101–111)
CO2: 24 mmol/L (ref 22–32)
Calcium: 9.1 mg/dL (ref 8.9–10.3)
Creatinine, Ser: 0.77 mg/dL (ref 0.44–1.00)
Glucose, Bld: 98 mg/dL (ref 65–99)
POTASSIUM: 3.7 mmol/L (ref 3.5–5.1)
SODIUM: 136 mmol/L (ref 135–145)

## 2016-06-02 LAB — CBC WITH DIFFERENTIAL/PLATELET
BASOS ABS: 0 10*3/uL (ref 0.0–0.1)
Basophils Relative: 0 %
EOS ABS: 0.2 10*3/uL (ref 0.0–0.7)
EOS PCT: 3 %
HCT: 44.8 % (ref 36.0–46.0)
HEMOGLOBIN: 15.3 g/dL — AB (ref 12.0–15.0)
Lymphocytes Relative: 31 %
Lymphs Abs: 2 10*3/uL (ref 0.7–4.0)
MCH: 30.1 pg (ref 26.0–34.0)
MCHC: 34.2 g/dL (ref 30.0–36.0)
MCV: 88 fL (ref 78.0–100.0)
Monocytes Absolute: 0.5 10*3/uL (ref 0.1–1.0)
Monocytes Relative: 8 %
NEUTROS PCT: 58 %
Neutro Abs: 3.6 10*3/uL (ref 1.7–7.7)
PLATELETS: 235 10*3/uL (ref 150–400)
RBC: 5.09 MIL/uL (ref 3.87–5.11)
RDW: 13.2 % (ref 11.5–15.5)
WBC: 6.3 10*3/uL (ref 4.0–10.5)

## 2016-06-02 MED ORDER — SODIUM CHLORIDE 0.9 % IV BOLUS (SEPSIS)
1000.0000 mL | Freq: Once | INTRAVENOUS | Status: AC
  Administered 2016-06-02: 1000 mL via INTRAVENOUS

## 2016-06-02 MED ORDER — ONDANSETRON 4 MG PO TBDP: 4.0000 mg | ORAL_TABLET | Freq: Three times a day (TID) | ORAL | 0 refills | Status: DC | PRN

## 2016-06-02 MED ORDER — HYDROCHLOROTHIAZIDE 25 MG PO TABS: 25.0000 mg | ORAL_TABLET | Freq: Every day | ORAL | 0 refills | Status: DC

## 2016-06-02 MED ORDER — HYDROCOD POLST-CPM POLST ER 10-8 MG/5ML PO SUER
5.0000 mL | Freq: Once | ORAL | Status: AC
  Administered 2016-06-02: 5 mL via ORAL
  Filled 2016-06-02: qty 5

## 2016-06-02 MED ORDER — METHYLPREDNISOLONE SODIUM SUCC 125 MG IJ SOLR
125.0000 mg | Freq: Once | INTRAMUSCULAR | Status: AC
  Administered 2016-06-02: 125 mg via INTRAVENOUS
  Filled 2016-06-02: qty 2

## 2016-06-02 MED ORDER — BENZONATATE 100 MG PO CAPS: 100.0000 mg | ORAL_CAPSULE | Freq: Three times a day (TID) | ORAL | 0 refills | Status: DC

## 2016-06-02 MED ORDER — ONDANSETRON HCL 4 MG/2ML IJ SOLN
4.0000 mg | Freq: Once | INTRAMUSCULAR | Status: AC
  Administered 2016-06-02: 4 mg via INTRAVENOUS
  Filled 2016-06-02: qty 2

## 2016-06-02 MED ORDER — ALBUTEROL SULFATE (2.5 MG/3ML) 0.083% IN NEBU
5.0000 mg | INHALATION_SOLUTION | Freq: Once | RESPIRATORY_TRACT | Status: AC
  Administered 2016-06-02: 5 mg via RESPIRATORY_TRACT

## 2016-06-02 MED ORDER — ALBUTEROL SULFATE (2.5 MG/3ML) 0.083% IN NEBU
INHALATION_SOLUTION | RESPIRATORY_TRACT | Status: AC
  Filled 2016-06-02: qty 6

## 2016-06-02 MED ORDER — ALBUTEROL (5 MG/ML) CONTINUOUS INHALATION SOLN
10.0000 mg/h | INHALATION_SOLUTION | Freq: Once | RESPIRATORY_TRACT | Status: AC
  Administered 2016-06-02: 10 mg/h via RESPIRATORY_TRACT
  Filled 2016-06-02: qty 20

## 2016-06-02 MED ORDER — PREDNISONE 20 MG PO TABS: 20.0000 mg | ORAL_TABLET | Freq: Two times a day (BID) | ORAL | 0 refills | Status: DC

## 2016-06-02 NOTE — ED Notes (Signed)
Pt states that she took tylenol at 0800 today, she knows she took 2 pills.  She states that she took motrin "between 2 or 3 this morning" she is unsure of the dosage but knows that she took 2 pills.

## 2016-06-02 NOTE — ED Provider Notes (Signed)
[facility] DEPT Provider Note   CSN: [id number] Arrival date & time: 06/02/16  1009     History   Chief Complaint Chief Complaint  Patient presents with  . Shortness of Breath  . Cough    HPI Angela Burton is a 45 y.o. female.  She is here with a four-day illness. Symptoms started on Sunday. Has a history of asthma. Started with a cough. Fevers and body aches. Episodes of vomiting. Wheezing. Using a friend ordered relative's nebulizer. Normally uses MDI. Getting some relief with nebulized albuterol. Vomiting last this morning. Temperature is up to 103. Seen in urgent care. Had negative flu testing. Sent here for evaluation. She did receive a flu vaccination this year.  HPI  Past Medical History:  Diagnosis Date  . Asthma   . Hypertension   . Obesity     There are no active problems to display for this patient.   Past Surgical History:  Procedure Laterality Date  . TUBAL LIGATION      OB History    No data available       Home Medications    Prior to Admission medications   Medication Sig Start Date End Date Taking? Authorizing Provider  albuterol (PROVENTIL HFA;VENTOLIN HFA) 108 (90 Base) MCG/ACT inhaler Inhale 1-2 puffs into the lungs every 6 (six) hours as needed for wheezing or shortness of breath. 01/01/16  Yes Shary Decamp, PA-C  benzonatate (TESSALON) 100 MG capsule Take 1 capsule (100 mg total) by mouth every 8 (eight) hours. 06/02/16   Tanna Furry, MD  hydrochlorothiazide (HYDRODIURIL) 25 MG tablet Take 1 tablet (25 mg total) by mouth daily. 06/02/16   Tanna Furry, MD  HYDROcodone-acetaminophen (HYCET) 7.5-325 mg/15 ml solution Take 10 mLs by mouth 4 (four) times daily as needed for moderate pain or severe pain. Patient not taking: Reported on 02/16/2015 08/22/14   Clayton Bibles, PA-C  metoCLOPramide (REGLAN) 10 MG tablet Take 1 tablet (10 mg total) by mouth every 6 (six) hours as needed for nausea or vomiting. Patient not taking: Reported on 06/02/2016 02/16/15    Clayton Bibles, PA-C  ondansetron (ZOFRAN ODT) 4 MG disintegrating tablet Take 1 tablet (4 mg total) by mouth every 8 (eight) hours as needed for nausea. 06/02/16   Tanna Furry, MD  predniSONE (DELTASONE) 20 MG tablet Take 1 tablet (20 mg total) by mouth 2 (two) times daily with a meal. 06/02/16   Tanna Furry, MD    Family History No family history on file.  Social History Social History  Substance Use Topics  . Smoking status: Former Research scientist (life sciences)  . Smokeless tobacco: Not on file  . Alcohol use Yes     Allergies   Sulfa antibiotics; Aspirin; and Erythromycin   Review of Systems Review of Systems  Constitutional: Positive for activity change, chills, fatigue and fever. Negative for appetite change and diaphoresis.  HENT: Negative for mouth sores, sore throat and trouble swallowing.   Eyes: Negative for visual disturbance.  Respiratory: Positive for cough, shortness of breath and wheezing. Negative for chest tightness.   Cardiovascular: Negative for chest pain.  Gastrointestinal: Positive for nausea and vomiting. Negative for abdominal distention, abdominal pain and diarrhea.  Endocrine: Negative for polydipsia, polyphagia and polyuria.  Genitourinary: Negative for dysuria, frequency and hematuria.  Musculoskeletal: Positive for myalgias. Negative for gait problem.  Skin: Negative for color change, pallor and rash.  Neurological: Negative for dizziness, syncope, light-headedness and headaches.  Hematological: Does not bruise/bleed easily.  Psychiatric/Behavioral: Negative for behavioral problems and  confusion.     Physical Exam Updated Vital Signs BP 170/88 (BP Location: Right Arm)   Pulse 72   Temp 98.4 F (36.9 C) (Oral)   Resp 15   Ht 5\' 7"  (1.702 m)   Wt 250 lb (113.4 kg)   SpO2 100%   BMI 39.16 kg/m   Physical Exam  Constitutional: She is oriented to person, place, and time. She appears well-developed and well-nourished. No distress.  HENT:  Head: Normocephalic.    Normal pharynx  Eyes: Conjunctivae are normal. Pupils are equal, round, and reactive to light. No scleral icterus.  Neck: Normal range of motion. Neck supple. No thyromegaly present.  Cardiovascular: Normal rate and regular rhythm.  Exam reveals no gallop and no friction rub.   No murmur heard. Pulmonary/Chest: Effort normal and breath sounds normal. No respiratory distress. She has no wheezes. She has no rales.  No increased work of breathing. She has prolongation and wheezing in all fields. No focal diminished breath sounds. No crackles. No dependent edema. She is not tachycardic or tachypneic.  Abdominal: Soft. Bowel sounds are normal. She exhibits no distension. There is no tenderness. There is no rebound.  Musculoskeletal: Normal range of motion.  Neurological: She is alert and oriented to person, place, and time.  Skin: Skin is warm and dry. No rash noted.  Psychiatric: She has a normal mood and affect. Her behavior is normal.     ED Treatments / Results  Labs (all labs ordered are listed, but only abnormal results are displayed) Labs Reviewed  CBC WITH DIFFERENTIAL/PLATELET - Abnormal; Notable for the following:       Result Value   Hemoglobin 15.3 (*)    All other components within normal limits  BASIC METABOLIC PANEL    EKG  EKG Interpretation  Date/Time:  Thursday June 02 2016 10:15:34 EDT Ventricular Rate:  61 PR Interval:  138 QRS Duration: 80 QT Interval:  426 QTC Calculation: 428 R Axis:   71 Text Interpretation:  Normal sinus rhythm Normal ECG When compared with ECG of 05/26/2013, No significant change was found Confirmed by [facility]  MD, DAVID ([id number]) on 06/04/2016 10:08:34 PM       Radiology No results found.  Procedures Procedures (including critical care time)  Medications Ordered in ED Medications  albuterol (PROVENTIL) (2.5 MG/3ML) 0.083% nebulizer solution 5 mg (5 mg Nebulization Given 06/02/16 1024)  albuterol (PROVENTIL,VENTOLIN) solution  continuous neb (10 mg/hr Nebulization Given by Other 06/02/16 1228)  methylPREDNISolone sodium succinate (SOLU-MEDROL) 125 mg/2 mL injection 125 mg (125 mg Intravenous Given 06/02/16 1229)  ondansetron (ZOFRAN) injection 4 mg (4 mg Intravenous Given 06/02/16 1229)  chlorpheniramine-HYDROcodone (TUSSIONEX) 10-8 MG/5ML suspension 5 mL (5 mLs Oral Given 06/02/16 1231)  sodium chloride 0.9 % bolus 1,000 mL (0 mLs Intravenous Stopped 06/02/16 1346)     Initial Impression / Assessment and Plan / ED Course  I have reviewed the triage vital signs and the nursing notes.  Pertinent labs & imaging results that were available during my care of the patient were reviewed by me and considered in my medical decision making (see chart for details).  Clinical Course    Chest x-ray is normal. Likely viral syndrome. Doubt influenza after vaccine and normal swab today. Plan symptomatic treatment. No glass appeared well, steroids, antiemetics, fluids. We'll reevaluate.  Final Clinical Impressions(s) / ED Diagnoses   Final diagnoses:  Viral URI with cough  Moderate persistent asthma with exacerbation    New Prescriptions Discharge Medication List as  of 06/02/2016  1:36 PM    START taking these medications   Details  benzonatate (TESSALON) 100 MG capsule Take 1 capsule (100 mg total) by mouth every 8 (eight) hours., Starting Thu 06/02/2016, Print    ondansetron (ZOFRAN ODT) 4 MG disintegrating tablet Take 1 tablet (4 mg total) by mouth every 8 (eight) hours as needed for nausea., Starting Thu 06/02/2016, Print    predniSONE (DELTASONE) 20 MG tablet Take 1 tablet (20 mg total) by mouth 2 (two) times daily with a meal., Starting Thu 06/02/2016, Print         Tanna Furry, MD 06/10/16 1015

## 2016-06-02 NOTE — ED Triage Notes (Signed)
Pt sent here from MD office for productive cough, wheezing, sob and chills. Pt has wheezing with labored respirations. Able to speak in complete sentences. Coughing up thick clear sputum.

## 2016-06-02 NOTE — Discharge Instructions (Signed)
Use your albuterol inhaler, or nebulizer as needed every 4 hours.   Tessalon for cough.

## 2017-07-04 ENCOUNTER — Other Ambulatory Visit: Payer: Self-pay

## 2017-07-04 ENCOUNTER — Emergency Department (HOSPITAL_COMMUNITY)
Admission: EM | Admit: 2017-07-04 | Discharge: 2017-07-05 | Disposition: A | Discharge status: Home / Self Care | Payer: Commercial Managed Care - PPO | Attending: Emergency Medicine | Admitting: Emergency Medicine

## 2017-07-04 ENCOUNTER — Emergency Department (HOSPITAL_COMMUNITY): Payer: Commercial Managed Care - PPO

## 2017-07-04 ENCOUNTER — Encounter (HOSPITAL_COMMUNITY): Payer: Self-pay | Admitting: Emergency Medicine

## 2017-07-04 DIAGNOSIS — Z87891 Personal history of nicotine dependence: Secondary | ICD-10-CM | POA: Insufficient documentation

## 2017-07-04 DIAGNOSIS — I10 Essential (primary) hypertension: Secondary | ICD-10-CM | POA: Diagnosis not present

## 2017-07-04 DIAGNOSIS — R079 Chest pain, unspecified: Secondary | ICD-10-CM | POA: Insufficient documentation

## 2017-07-04 DIAGNOSIS — R102 Pelvic and perineal pain: Secondary | ICD-10-CM | POA: Diagnosis not present

## 2017-07-04 DIAGNOSIS — Z79899 Other long term (current) drug therapy: Secondary | ICD-10-CM | POA: Insufficient documentation

## 2017-07-04 DIAGNOSIS — J45909 Unspecified asthma, uncomplicated: Secondary | ICD-10-CM | POA: Diagnosis not present

## 2017-07-04 DIAGNOSIS — Z975 Presence of (intrauterine) contraceptive device: Secondary | ICD-10-CM | POA: Insufficient documentation

## 2017-07-04 LAB — BASIC METABOLIC PANEL
Anion gap: 9 (ref 5–15)
BUN: 9 mg/dL (ref 6–20)
CALCIUM: 9.1 mg/dL (ref 8.9–10.3)
CO2: 25 mmol/L (ref 22–32)
Chloride: 102 mmol/L (ref 101–111)
Creatinine, Ser: 0.85 mg/dL (ref 0.44–1.00)
GFR calc Af Amer: >60 mL/min (ref >60)
GLUCOSE: 91 mg/dL (ref 65–99)
POTASSIUM: 3.6 mmol/L (ref 3.5–5.1)
Sodium: 136 mmol/L (ref 135–145)

## 2017-07-04 LAB — I-STAT TROPONIN, ED: Troponin i, poc: 0 ng/mL (ref 0.00–0.08)

## 2017-07-04 LAB — CBC
HEMATOCRIT: 43.6 % (ref 36.0–46.0)
Hemoglobin: 14.6 g/dL (ref 12.0–15.0)
MCH: 30.4 pg (ref 26.0–34.0)
MCHC: 33.5 g/dL (ref 30.0–36.0)
MCV: 90.6 fL (ref 78.0–100.0)
Platelets: 262 10*3/uL (ref 150–400)
RBC: 4.81 MIL/uL (ref 3.87–5.11)
RDW: 13.5 % (ref 11.5–15.5)
WBC: 7.8 10*3/uL (ref 4.0–10.5)

## 2017-07-04 NOTE — ED Triage Notes (Signed)
Pt presents to ED for assessment of 5 days of central and left sided chest pain.  Pt c/o finger tingling, SOB, diaphoresis.  Pt states when it first started she was woken up from sleep.  Pt c/o light-headedness and fatigue.  Hx of HTN, DM, and recently quit smoking.

## 2017-07-04 NOTE — ED Notes (Signed)
Patient updated on rooming

## 2017-07-05 MED ORDER — IPRATROPIUM-ALBUTEROL 0.5-2.5 (3) MG/3ML IN SOLN
3.0000 mL | Freq: Once | RESPIRATORY_TRACT | Status: DC
  Filled 2017-07-05: qty 3

## 2017-07-05 NOTE — ED Notes (Signed)
Patient tearful again Rob PA came to talk to patient again patient wants no tests completed

## 2017-07-05 NOTE — ED Notes (Signed)
Patient tearful saying providers have not been in room and she does not want breathing treatment. RN has been in room multiple times and provider has been in room prior

## 2017-07-05 NOTE — ED Provider Notes (Signed)
[place] EMERGENCY DEPARTMENT Provider Note   CSN: [id number] Arrival date & time: 07/04/17  1758     History   Chief Complaint Chief Complaint  Patient presents with  . Chest Pain    HPI Angela Burton is a 46 y.o. female.  Patient history of asthma and hypertension presents to the emergency department with chief complaint of 2 complaints. 1.  Chest pain: Patient reports having central chest pain for the past 5 days.  She complains of some associated shortness of breath and diaphoresis.  She also complains of feeling lightheaded and fatigued.  She denies any history of ACS, PE, or DVT.  She states that this does not feel like a asthma exacerbation.  She denies any productive cough, or fever.  She denies any other associated symptoms.  2.  Pelvic pain: Patient complains of lower lower abdominal and pelvic pain for the past several days.  She reports that she has been passing large blood clots.  She states that she is concerned about the placement of her IUD.  She denies any associated fever, chills, nausea, vomiting, diarrhea.  She denies any dysuria.   The history is provided by the patient. No language interpreter was used.    Past Medical History:  Diagnosis Date  . Asthma   . Hypertension   . Obesity     There are no active problems to display for this patient.   Past Surgical History:  Procedure Laterality Date  . TUBAL LIGATION      OB History    No data available       Home Medications    Prior to Admission medications   Medication Sig Start Date End Date Taking? Authorizing Provider  albuterol (PROVENTIL HFA;VENTOLIN HFA) 108 (90 Base) MCG/ACT inhaler Inhale 1-2 puffs into the lungs every 6 (six) hours as needed for wheezing or shortness of breath. 01/01/16  Yes Shary Decamp, PA-C  lisinopril-hydrochlorothiazide (PRINZIDE,ZESTORETIC) 20-12.5 MG tablet Take 1 tablet daily by mouth. 05/06/17  Yes [provider]  benzonatate  (TESSALON) 100 MG capsule Take 1 capsule (100 mg total) by mouth every 8 (eight) hours. Patient not taking: Reported on 07/04/2017 06/02/16   Tanna Furry, MD  hydrochlorothiazide (HYDRODIURIL) 25 MG tablet Take 1 tablet (25 mg total) by mouth daily. Patient not taking: Reported on 07/04/2017 06/02/16   Tanna Furry, MD  HYDROcodone-acetaminophen (HYCET) 7.5-325 mg/15 ml solution Take 10 mLs by mouth 4 (four) times daily as needed for moderate pain or severe pain. Patient not taking: Reported on 02/16/2015 08/22/14   Clayton Bibles, PA-C  metoCLOPramide (REGLAN) 10 MG tablet Take 1 tablet (10 mg total) by mouth every 6 (six) hours as needed for nausea or vomiting. Patient not taking: Reported on 06/02/2016 02/16/15   Clayton Bibles, PA-C  ondansetron (ZOFRAN ODT) 4 MG disintegrating tablet Take 1 tablet (4 mg total) by mouth every 8 (eight) hours as needed for nausea. Patient not taking: Reported on 07/04/2017 06/02/16   Tanna Furry, MD  predniSONE (DELTASONE) 20 MG tablet Take 1 tablet (20 mg total) by mouth 2 (two) times daily with a meal. Patient not taking: Reported on 07/04/2017 06/02/16   Tanna Furry, MD    Family History History reviewed. No pertinent family history.  Social History Social History   Tobacco Use  . Smoking status: Former Research scientist (life sciences)  . Smokeless tobacco: Never Used  Substance Use Topics  . Alcohol use: Yes  . Drug use: Yes    Types: Marijuana  Allergies   Sulfa antibiotics; Aspirin; and Erythromycin   Review of Systems Review of Systems  All other systems reviewed and are negative.    Physical Exam Updated Vital Signs BP (!) 167/90   Pulse (!) 54   Temp 98.4 F (36.9 C) (Oral)   Resp 14   SpO2 99%   Physical Exam  Constitutional: She is oriented to person, place, and time. She appears well-developed and well-nourished.  HENT:  Head: Normocephalic and atraumatic.  Eyes: Conjunctivae and EOM are normal.  Neck: Normal range of motion.  Cardiovascular:  Normal rate.  Pulmonary/Chest: Effort normal.  Abdominal: She exhibits no distension.  Musculoskeletal: Normal range of motion.  Neurological: She is alert and oriented to person, place, and time.  Skin: Skin is dry.  Psychiatric: She has a normal mood and affect. Her behavior is normal. Judgment and thought content normal.  Nursing note and vitals reviewed.    ED Treatments / Results  Labs (all labs ordered are listed, but only abnormal results are displayed) Labs Reviewed  BASIC METABOLIC PANEL  CBC  I-STAT TROPONIN, ED  GC/CHLAMYDIA PROBE AMP ([place]) NOT AT [facility]    EKG  EKG Interpretation None       Radiology Dg Chest 2 View  Result Date: 07/04/2017 CLINICAL DATA:  46 year old female with chest pain. EXAM: CHEST  2 VIEW COMPARISON:  Chest radiograph dated 06/02/2016 FINDINGS: The heart size and mediastinal contours are within normal limits. Both lungs are clear. The visualized skeletal structures are unremarkable. IMPRESSION: No active cardiopulmonary disease. Electronically Signed   By: Anner Crete M.D.   On: 07/04/2017 18:52    Procedures Procedures (including critical care time)  Medications Ordered in ED Medications  ipratropium-albuterol (DUONEB) 0.5-2.5 (3) MG/3ML nebulizer solution 3 mL (3 mLs Nebulization Not Given 07/05/17 0127)     Initial Impression / Assessment and Plan / ED Course  I have reviewed the triage vital signs and the nursing notes.  Pertinent labs & imaging results that were available during my care of the patient were reviewed by me and considered in my medical decision making (see chart for details).    Patient with chest pain and pelvic pain.  Chest pain started 5 days ago.  No SOB, not tachycardic.  Doubt PE.  Troponin is 0.00.  EKG shows no ischemic changes or arrhythmias.  CXR is clear, no evidence of dissection or pneumothorax or infection.  Pelvic pain and vaginal bleeding has also been present for the past day or two.   Has passed large blood clots today.  Concerned about IUD placement.  H/H is stable.  VSS.  Patient is non-toxic appearing.  When I was interviewing the patient, she stated numerous how frustrated she was about the long wait and about seemingly being ignored.  I apologized several times to the patient, and advised her that I would be happy to complete her workup now that she has come back to the room.  This would include a repeat troponin, pelvic US, and pelvic exam.  Patient states that she doesn't want any of it and refused any further treatment or workup.  She states that she will follow-up with her OBGYN. Patient has capacity to make her own medical decisions.  Will discharge to home.  Patient advised that she can return at anytime.     Final Clinical Impressions(s) / ED Diagnoses   Final diagnoses:  Nonspecific chest pain  Pelvic pain    ED Discharge Orders  None       Montine Circle, PA-C 07/05/17 [place], Delice Bison, DO 07/05/17 [phone]

## 2018-08-17 ENCOUNTER — Inpatient Hospital Stay (HOSPITAL_COMMUNITY)
Admission: AD | Admit: 2018-08-17 | Discharge: 2018-08-17 | Disposition: A | Discharge status: Home / Self Care | Payer: Commercial Managed Care - PPO | Attending: Obstetrics & Gynecology | Admitting: Obstetrics & Gynecology

## 2018-08-17 ENCOUNTER — Encounter (HOSPITAL_COMMUNITY): Payer: Self-pay | Admitting: *Deleted

## 2018-08-17 DIAGNOSIS — F17210 Nicotine dependence, cigarettes, uncomplicated: Secondary | ICD-10-CM | POA: Insufficient documentation

## 2018-08-17 DIAGNOSIS — N939 Abnormal uterine and vaginal bleeding, unspecified: Secondary | ICD-10-CM

## 2018-08-17 DIAGNOSIS — I10 Essential (primary) hypertension: Secondary | ICD-10-CM | POA: Insufficient documentation

## 2018-08-17 DIAGNOSIS — Z3202 Encounter for pregnancy test, result negative: Secondary | ICD-10-CM | POA: Insufficient documentation

## 2018-08-17 DIAGNOSIS — N921 Excessive and frequent menstruation with irregular cycle: Secondary | ICD-10-CM

## 2018-08-17 DIAGNOSIS — N920 Excessive and frequent menstruation with regular cycle: Secondary | ICD-10-CM | POA: Insufficient documentation

## 2018-08-17 DIAGNOSIS — Z975 Presence of (intrauterine) contraceptive device: Secondary | ICD-10-CM

## 2018-08-17 LAB — URINALYSIS, ROUTINE W REFLEX MICROSCOPIC
Bilirubin Urine: NEGATIVE
Glucose, UA: NEGATIVE mg/dL
Ketones, ur: NEGATIVE mg/dL
NITRITE: NEGATIVE
Protein, ur: 30 mg/dL — AB
RBC / HPF: >50 RBC/hpf — ABNORMAL HIGH (ref 0–5)
SPECIFIC GRAVITY, URINE: 1.013 (ref 1.005–1.030)
pH: 5 (ref 5.0–8.0)

## 2018-08-17 LAB — CBC
HCT: 43.2 % (ref 36.0–46.0)
HEMOGLOBIN: 14 g/dL (ref 12.0–15.0)
MCH: 29.9 pg (ref 26.0–34.0)
MCHC: 32.4 g/dL (ref 30.0–36.0)
MCV: 92.1 fL (ref 80.0–100.0)
NRBC: 0 % (ref 0.0–0.2)
Platelets: 251 10*3/uL (ref 150–400)
RBC: 4.69 MIL/uL (ref 3.87–5.11)
RDW: 13.7 % (ref 11.5–15.5)
WBC: 5.8 10*3/uL (ref 4.0–10.5)

## 2018-08-17 LAB — POCT PREGNANCY, URINE: Preg Test, Ur: NEGATIVE

## 2018-08-17 MED ORDER — HYDROCHLOROTHIAZIDE 25 MG PO TABS: 25.0000 mg | ORAL_TABLET | Freq: Every day | ORAL | 2 refills | Status: DC

## 2018-08-17 MED ORDER — MEGESTROL ACETATE 40 MG PO TABS: 40.0000 mg | ORAL_TABLET | Freq: Two times a day (BID) | ORAL | 3 refills | Status: DC

## 2018-08-17 NOTE — MAU Provider Note (Signed)
Chief Complaint: Abdominal Pain and Vaginal Bleeding   First Provider Initiated Contact with Patient 08/17/18 1418      SUBJECTIVE HPI: Angela Burton is a 47 y.o. [id number] who presents to maternity admissions reporting heavy vaginal bleeding with menses x 4 months.  She reports hx of IUD, with switch from Mirena to copper IUD on 2017.  She received IUD related to heavy periods initially.  For the last 2 years, she reports light menses, spotting only most months but 4 months ago started having periods that last 2 weeks and are heavy with clots.  This month she feels more tired than usual with the bleeding. There is light abdominal cramping with the bleeding.  She denies any h/a, chest pain, dizziness, or palpitations.  She has not tried any treatments.  She reports she loved her Mirena IUD but when it was due to be replaced, she saw a provider who talked about the Paragard IUD and she chose to try it at that time.  She has some hx of HTN but does not have insurance so has not seen a provider recently. She is not on any blood pressure medications.     HPI  Past Medical History:  Diagnosis Date  . Asthma    albuterol  . Hypertension   . Obesity    Past Surgical History:  Procedure Laterality Date  . DILATION AND CURETTAGE OF UTERUS    . TUBAL LIGATION     Social History   Socioeconomic History  . Marital status: Legally Separated    Spouse name: Not on file  . Number of children: Not on file  . Years of education: Not on file  . Highest education level: Not on file  Occupational History  . Not on file  Social Needs  . Financial resource strain: Not on file  . Food insecurity:    Worry: Not on file    Inability: Not on file  . Transportation needs:    Medical: Not on file    Non-medical: Not on file  Tobacco Use  . Smoking status: Current Every Day Smoker    Packs/day: 0.25    Types: Cigarettes  . Smokeless tobacco: Never Used  Substance and Sexual Activity  . Alcohol use:  Not Currently    Comment: rarely  . Drug use: Yes    Types: Marijuana    Comment: last used 08/15/18  . Sexual activity: Yes    Birth control/protection: I.U.D., Surgical  Lifestyle  . Physical activity:    Days per week: Not on file    Minutes per session: Not on file  . Stress: Not on file  Relationships  . Social connections:    Talks on phone: Not on file    Gets together: Not on file    Attends religious service: Not on file    Active member of club or organization: Not on file    Attends meetings of clubs or organizations: Not on file    Relationship status: Not on file  . Intimate partner violence:    Fear of current or ex partner: Not on file    Emotionally abused: Not on file    Physically abused: Not on file    Forced sexual activity: Not on file  Other Topics Concern  . Not on file  Social History Narrative  . Not on file   No current facility-administered medications on file prior to encounter.    Current Outpatient Medications on File Prior to Encounter  Medication Sig Dispense Refill  . albuterol (PROVENTIL HFA;VENTOLIN HFA) 108 (90 Base) MCG/ACT inhaler Inhale 1-2 puffs into the lungs every 6 (six) hours as needed for wheezing or shortness of breath. 1 Inhaler 2  . benzonatate (TESSALON) 100 MG capsule Take 1 capsule (100 mg total) by mouth every 8 (eight) hours. (Patient not taking: Reported on 07/04/2017) 21 capsule 0  . HYDROcodone-acetaminophen (HYCET) 7.5-325 mg/15 ml solution Take 10 mLs by mouth 4 (four) times daily as needed for moderate pain or severe pain. (Patient not taking: Reported on 02/16/2015) 200 mL 0  . lisinopril-hydrochlorothiazide (PRINZIDE,ZESTORETIC) 20-12.5 MG tablet Take 1 tablet daily by mouth.  0  . metoCLOPramide (REGLAN) 10 MG tablet Take 1 tablet (10 mg total) by mouth every 6 (six) hours as needed for nausea or vomiting. (Patient not taking: Reported on 06/02/2016) 15 tablet 0  . ondansetron (ZOFRAN ODT) 4 MG disintegrating tablet  Take 1 tablet (4 mg total) by mouth every 8 (eight) hours as needed for nausea. (Patient not taking: Reported on 07/04/2017) 6 tablet 0  . predniSONE (DELTASONE) 20 MG tablet Take 1 tablet (20 mg total) by mouth 2 (two) times daily with a meal. (Patient not taking: Reported on 07/04/2017) 10 tablet 0   Allergies  Allergen Reactions  . Sulfa Antibiotics Anaphylaxis, Hives, Swelling and Rash  . Aspirin Hives  . Erythromycin Other (See Comments)    Had a seizure.     ROS:  Review of Systems  Constitutional: Positive for fatigue. Negative for chills and fever.  Respiratory: Negative for shortness of breath.   Cardiovascular: Negative for chest pain.  Gastrointestinal: Positive for abdominal pain. Negative for nausea and vomiting.  Genitourinary: Positive for vaginal bleeding. Negative for difficulty urinating, dysuria, flank pain, pelvic pain, vaginal discharge and vaginal pain.  Neurological: Negative for dizziness and headaches.  Psychiatric/Behavioral: Negative.      I have reviewed patient's Past Medical Hx, Surgical Hx, Family Hx, Social Hx, medications and allergies.   Physical Exam   Patient Vitals for the past 24 hrs:  BP Temp Temp src Pulse Resp SpO2 Height Weight  08/17/18 1300 (!) 168/100 - - (!) 57 - - - -  08/17/18 1237 (!) 148/97 98.4 F (36.9 C) Oral (!) 54 17 99 % 5\' 6"  (1.676 m) 109.8 kg   Constitutional: Well-developed, well-nourished female in no acute distress.  Cardiovascular: normal rate Respiratory: normal effort GI: Abd soft, non-tender. Pos BS x 4 MS: Extremities nontender, no edema, normal ROM Neurologic: Alert and oriented x 4.  GU: Neg CVAT.  PELVIC EXAM: Cervix pink, visually closed, without lesion, small amount dark red bleeding requiring one fox swab to visualize cervix, IUD strings visible 3 cm length from cervical os, vaginal walls and external genitalia normal Bimanual exam: Cervix 0/long/high, firm, anterior, neg CMT, uterus nontender,  nonenlarged, adnexa without tenderness, enlargement, or mass   LAB RESULTS Results for orders placed or performed during the hospital encounter of 08/17/18 (from the past 24 hour(s))  Urinalysis, Routine w reflex microscopic     Status: Abnormal   Collection Time: 08/17/18 12:48 PM  Result Value Ref Range   Color, Urine YELLOW YELLOW   APPearance HAZY (A) CLEAR   Specific Gravity, Urine 1.013 1.005 - 1.030   pH 5.0 5.0 - 8.0   Glucose, UA NEGATIVE NEGATIVE mg/dL   Hgb urine dipstick LARGE (A) NEGATIVE   Bilirubin Urine NEGATIVE NEGATIVE   Ketones, ur NEGATIVE NEGATIVE mg/dL   Protein, ur 30 (A) NEGATIVE  mg/dL   Nitrite NEGATIVE NEGATIVE   Leukocytes, UA TRACE (A) NEGATIVE   RBC / HPF >50 (H) 0 - 5 RBC/hpf   WBC, UA 11-20 0 - 5 WBC/hpf   Bacteria, UA RARE (A) NONE SEEN   Squamous Epithelial / LPF 6-10 0 - 5   Mucus PRESENT   Pregnancy, urine POC     Status: None   Collection Time: 08/17/18 12:53 PM  Result Value Ref Range   Preg Test, Ur NEGATIVE NEGATIVE  CBC     Status: None   Collection Time: 08/17/18  2:23 PM  Result Value Ref Range   WBC 5.8 4.0 - 10.5 K/uL   RBC 4.69 3.87 - 5.11 MIL/uL   Hemoglobin 14.0 12.0 - 15.0 g/dL   HCT 43.2 36.0 - 46.0 %   MCV 92.1 80.0 - 100.0 fL   MCH 29.9 26.0 - 34.0 pg   MCHC 32.4 30.0 - 36.0 g/dL   RDW 13.7 11.5 - 15.5 %   Platelets 251 150 - 400 K/uL   nRBC 0.0 0.0 - 0.2 %       IMAGING No results found.  MAU Management/MDM: No evidence of anemia with hgb 14 today.  Pt with copper IUD, likely cause of heavy vaginal bleeding.  Will treat with Megace 40 mg BID x 1 month. Pt to f/u with health department for replacement of IUD with Liletta/Mirena.  Renewed pt existing Rx for HCTZ for HTN.  Pt to f/u with primary care ASAP, list and information about PCPs provided.  Return to MAU or after hours clinic for gyn emergencies. Pt discharged with strict bleeding precautions.  ASSESSMENT 1. Breakthrough bleeding associated with intrauterine  device (IUD)   2. Abnormal uterine bleeding (AUB)     PLAN Discharge home Allergies as of 08/17/2018      Reactions   Sulfa Antibiotics Anaphylaxis, Hives, Swelling, Rash   Aspirin Hives   Erythromycin Other (See Comments)   Had a seizure.       Medication List    STOP taking these medications   benzonatate 100 MG capsule Commonly known as:  TESSALON   HYDROcodone-acetaminophen 7.5-325 mg/15 ml solution Commonly known as:  HYCET   lisinopril-hydrochlorothiazide 20-12.5 MG tablet Commonly known as:  PRINZIDE,ZESTORETIC   metoCLOPramide 10 MG tablet Commonly known as:  REGLAN   ondansetron 4 MG disintegrating tablet Commonly known as:  ZOFRAN ODT   predniSONE 20 MG tablet Commonly known as:  DELTASONE     TAKE these medications   albuterol 108 (90 Base) MCG/ACT inhaler Commonly known as:  PROVENTIL HFA;VENTOLIN HFA Inhale 1-2 puffs into the lungs every 6 (six) hours as needed for wheezing or shortness of breath.   hydrochlorothiazide 25 MG tablet Commonly known as:  HYDRODIURIL Take 1 tablet (25 mg total) by mouth daily.   megestrol 40 MG tablet Commonly known as:  MEGACE Take 1 tablet (40 mg total) by mouth 2 (two) times daily.      Follow-up Information    Department, [facility] Follow up.   Why:  For contraception/IUD visit.  Return to after hours clinic at [facility] as needed for gyn urgent needs.   Contact information: [address] [id number] [place] Certified Nurse-Midwife 08/17/2018  3:38 PM

## 2018-08-17 NOTE — MAU Note (Signed)
Pt reports vaginal bleeding x one week, cramping and bleeding, passing clots. Pt states she has had an IUD since 2017 and was not having periods until 4 months ago and now she is bleeding 2 weeks out of every month.

## 2018-08-17 NOTE — Discharge Instructions (Signed)
[place] Resource Guide °(Revised August 2014) ° ° °Chronic Pain Problems:  °• [facility]:  [phone]  °         Patients need to be referred by their primary care doctor/specialist ° °Insufficient Money for Medicine:  °·         United Way: call "211"   °• MAP Program at [facility] [place] 641-8030 or [place] 641-7620 °          ° °No Primary Care Doctor:  °To locate a primary care doctor that accepts your insurance or provides certain services:  °·         [place] [facility]: 832-8000  °·         Physician Referral Service: 1-800-533-3463 ask for “My [place]” °• If no insurance, you need to see if you qualify for GCCN “orange card”, call to set      up appointment for eligibility/enrollment at [phone] or [phone] or visit [place] [facility] ([address], [place] and [address] -[place]) to meet with a GCCN enrollment specialist. ° °Agencies that provide inexpensive (sliding fee scale) medical care:  ° °•    [facility] [place] - [phone] °•    [facility]  -  [facility] - 336-878-6027 °•    [facility] - 336-832-7272 °•    [facility] - 336-832-4444 °•    [facility] - 336-832-3150 °•    [facility] - 336-832-8035 °• [facility] - [facility] [place] - 336-272-     1050 °• [facility] [place] - 336-370-9091 °• [facility]: 336-832-8035  °• [facility]: 336-832-4777  °• [facility]: 336-373-0678  °• [facility] - 336- 387-6161 ° ° ° °Medicaid-accepting [place] Providers:  °·         [facility] - 641-2100 (No Family Planning accepted) °·         [address], 641-2100, Mon-Fri 9am-5pm °·          [facility] - [phone] °• [address], [address], Mon-Thursday 8am-5pm, Fri 8am-noon °• [facility] - [phone] °·         [address], [address], Mon-Fri 7:30am-4:30pm °·         [facility] - 299-7000 °·         [address] [place] [address], Mon-Fri 8am-5pm  °·        [facility] - 373-1557 °·         [address] °·         Only accepts [place] Access Medicaid patients after they have their name applied to their card ° °Self Pay (no insurance) in [place]:  °         Sickle Cell Patients:  °• [address], (336) 832-1970 °[facility]: °• [address], [place] (336) 832-7272 ° °     [facility] °• [address], [place] (336) 832-4444 ° °[facility]: °• [address], (336) 832-8035 °           [facility]  °·         [address], (336) 832-4400 °[facility] °• [address], (336) 832-3150 °          [facility]  °·         [address], [place] [phone] °       [facility] - [address]  °·         641-2100, Mon-Fri 9am-7pm, Sat 9am-1pm °         [facility] [place] °·         [address] [place] [address], [phone] °         [facility] [place]  °·         [address], [phone] °[facility] [place] °• [address], [place] (336) 884-0224 °         [facility]  °·         [address] [place] [address], [phone] ° [facility]  °• [address], (336) 272-1050 °[facility] °• [address], (336) 370-9091 ° Dr. Osei-Bonsu  °·         [address], [place],  [phone] °         [facility]  °·         [address], [phone] °         [facility]  °·           [address], [phone] °         [facility]  °·         [address], [phone], 1st & 3rd Saturday every month, 10am-1pm ° °OTHERS:  [facility]  ([facility] Only)  (336) [phone] (Thursday only) ° °Strategies for finding a Primary Care Provider:  °1) Find a Doctor and Pay Out of Pocket  °Although you won't have to find out who is covered by your insurance plan, it is a good idea to ask around and get recommendations. You will then need to call the office and see if the doctor you have chosen will accept you as a new patient and what types of options they offer for patients who are self-pay. Some doctors offer discounts or will set up payment plans for their patients who do not have insurance, but you will need to ask so you aren't surprised when you get to your appointment.  °2) Contact [facility] - To see if you qualify for “orange card” access to healthcare safety net providers.  Call for appointment for eligibility/enrollment at [phone] or [phone]- [phone]. (Uninsured, 0-200% FPL, qualifying info)  °Applicants for GCCN are first required to see if they are eligible to enroll in the ACA Marketplace before enrolling in GCCN (and get an exemption if they are not).  °GCCN Criteria for acceptance is:  °? Proof of ACA Marketing exemption - form or documentation  °? Valid photo ID (driver's license, state identification card, passport, home country ID)  °? Proof of [place] residency (e.g. driver’s license, lease/landlord information, pay stubs with address, utility   bill, bank statement, etc.)  °? Proof of income (1040, last year's tax return, W2, 4 current pay stubs, other income proof)  °? Proof of assets (current bank statement + 3 most recent, disability paperwork, life insurance info, tax value on autos, etc.) ° °3)  Contact Your Local Health Department  °Not all health departments have doctors that can see patients for sick visits, but many do, so it is worth a call to see if yours does. If you don't know where your local health department is, you can check in your phone book. The CDC also has a tool to help you locate your state's health department, and many state websites also have listings of all of their local health departments.  °4) Find a Walk-in Clinic  °If your illness is not likely to be very severe or complicated, you may want to try a walk in clinic. These are popping up all over the country in pharmacies, drugstores, and shopping centers. They're usually staffed by nurse practitioners or physician assistants that have been trained to treat common illnesses and complaints. They're usually fairly quick and inexpensive. However, if you have serious medical issues or chronic medical problems, these are probably not your best option  ° °STD Testing:  °·         [place] [facility] [place], STD Clinic  °·         [address], [place], phone [phone] or [phone]  °·         Monday - Friday, call for an appointment °·         [facility] [place], STD Clinic  °·         [address], [place], phone [phone] or [phone]  °·         Monday - Friday, call for an appointment °Abuse/Neglect:  °·         [place] Child Abuse Hotline: 641-3795  °·         [place] Child Abuse Hotline: 800-378-5315 (After Hours) ° °Emergency Shelter:  °[facility] (336) 271-5959 ° [facility] [place]- (336) 881-5415 ° [facility] [place] - (336) 235-0330 ° [facility] - (336) 375-1332 (ages 11-17) ° Homeless Day Shelter @ [facility] - (336) 332-0824 °  °Mammograms - Free at [facility] - [place] - 614-3233 ° °Maternity Homes:  °·         [facility]: (336) 275-9566   (Homeless mother with  children) °·         [facility]: (704) 372-4663 (Mothers only) °• [facility]: (336) 370-9232 (Pregnant 16-21 years old) °• [facility] -(336) 641-7777 ° °[place] [facility]  °• [facility] °• [address], [place] (336) 355-9701 °·         [facility] [place]  °·         [address], [place]  °·         349-3220 °·         [employer]  °·         [address], [place]  °·         342-7768 °·         [facility]  °·         [address], [place]  °·         342-8140 °·         [facility]  °·         342-8316 °·         [place] [facility]  °·         1-888-581-9988 °·         [facility] [place]  °·         [address]  °·         336-349-4454, Insurance °·         [place] Child Abuse Hotline  °·         (336) 342-1394  °·         (336) 342-3537 (After Hours) ° °Behavioral Health Services /Substance Abuse Resources:  °·         [facility]: 882-2125  °·         [facility]: 784-9470  °·        [facility]: (336) 285-9073  °• Narcotics Helpline - 1-866-375-1272 °·         [facility]: (336) 845-3988  °·         [facility]idential [facility] Outpatient Substance Abuse [facility] - [facility]: 800-659-3381 °• [facility] - (336) 285-2605 °Psychological Services: °·         [facility]: 832-9600  °• [facility]: 1-877-626-1772 °·         [facility]  °·         [address] [place] [address], [place]  °·         ACCESS LINE: 1-800-256-2452     (24 Hour) °• Mobile Crisis:  °• HELPLINES:  °[facility] [place] (336) 370-4264 °[facility] - [place] (800) 451-9682 °• Walk In Crisis Services °      [facility] [address] [place] [address] - [place]  (336)676-6905 °       [facility] - (336)832-9600 or (336) 832-9700 ° [facility] - [address] - [place] (336)899-1505 ° [facility] - [address], [place] (336) 878-6098 ° ° °Dental Assistance:  °If unable to pay or uninsured, contact: [facility] to become qualified for the adult dental clinic. Patient must be enrolled in GCCN (uninsured, 0-200% FPL, qualifying info).  Enroll in GCCN first, then see Primary Care Physician assigned to you, the PCP makes a dental referral. [facility] will receive referral and contacts patient for appointment. ° °Patients with Medicaid  °         [address], 510-2600 ° [facility]Children up to 20 + Pregnant Women) - (336) 641-3152 ° [facility] - [address] - [address] (336) 235-2808 ° °If unable to pay, or uninsured: contact [place] [facility] (641-3152 in [place] - (Children only + Pregnant Women), 641-7733 in [place]- Children only) to become qualified for the adult dental clinic  °Must see if eligible to enroll in ACA Health Insurance Marketplace before enrolling into the GCCN (exemption required) (1-855-733-3711 for an appointment)  www.healthcare.gov;   1-800-318-2596.  If not eligible for ACA, then go by Department of Health and Human Services to see if eligible for “orange card.”    [address], [place] and [address]- [place].  Once you get an orange card, you will have a Primary Care home who will then refer you to dental if needed. ° ° ° ° ° ° ° °Other Low-Cost Community Dental Services:  ° [facility] - 334-4822 (ext 50251) °  [address] [place] [address] ° Dr. Civils - 272-4177 °  [address] °  ° [facility] - 734-7550 °  [address] ° °         [facility]  °         [address], [place], [place], [address]  °         723-1848, Ext. 123  °         2nd and 4th Thursday of the month at 6:30am (Simple  extractions only - no wisdom teeth or surgery) First come/First serve -First 10 clients served ° °         [facility] ([place] and [place] residents only) °         [address], [place], [place], [address]  °         723-7904 °          °         [facility]  °         342-8273 °         [place] [facility]  °        703-3100 °        [facility] °         570-6415 °  °Transportation Options: ° Ambulance - 911 - $250-$700 per ride °Family Member to accompany patient (if stable) - [place] [employer] - (336) 355-6499 ° PART - (336) 662-0002 ° Taxi - (336) 272-5112 - [place] ° [facility] - (336) 333-6589 (Application required) ° [place] [facility] - (336) 641-4848 °  ° °

## 2019-02-19 ENCOUNTER — Ambulatory Visit: Payer: Self-pay | Admitting: *Deleted

## 2019-02-19 NOTE — Telephone Encounter (Signed)
I have not been exposed to COVID-19 but I came back from [facility] 2 weeks ago and I'm wondering if I have it.  They have a large outbreak of COVID-19 there.  See triage notes.    Since she does not have a PCP and has mild shortness of breath I have referred her to [facility] or ED.   She is going to call the [facility] and see what it would cost to be seen.   I encouraged her to be evaluated either at the urgent care or ED.   She assured me she would.      Reason for Disposition . HIGH RISK patient (e.g., age > 19 years, diabetes, heart or lung disease, weak immune system)    Has asthma  Answer Assessment - Initial Assessment Questions 1. COVID-19 DIAGNOSIS: "Who made your Coronavirus (COVID-19) diagnosis?" "Was it confirmed by a positive lab test?" If not diagnosed by a HCP, ask "Are there lots of cases (community spread) where you live?" (See public health department website, if unsure)     I came back from [facility] [place] 2 wks ago and there is a large outbreak of COVID-19 going on there.   I'm wondering if I have it. 2. ONSET: "When did the COVID-19 symptoms start?"      2 wks ago. 3. WORST SYMPTOM: "What is your worst symptom?" (e.g., cough, fever, shortness of breath, muscle aches)     Coughing and shortness of breath 4. COUGH: "Do you have a cough?" If so, ask: "How bad is the cough?"       Yes dry coiugh 5. FEVER: "Do you have a fever?" If so, ask: "What is your temperature, how was it measured, and when did it start?"     No 6. RESPIRATORY STATUS: "Describe your breathing?" (e.g., shortness of breath, wheezing, unable to speak)      Short of breath 7. BETTER-SAME-WORSE: "Are you getting better, staying the same or getting worse compared to yesterday?"  If getting worse, ask, "In what way?"     Worse 8. HIGH RISK DISEASE: "Do you have any chronic medical problems?" (e.g., asthma, heart or lung disease, weak immune system, etc.)     Asthma  I thought these  symptoms was my asthma acting up. 9. PREGNANCY: "Is there any chance you are pregnant?" "When was your last menstrual period?"     Not asked 10. OTHER SYMPTOMS: "Do you have any other symptoms?"  (e.g., chills, fatigue, headache, loss of smell or taste, muscle pain, sore throat)       Weakness, chills, headache, shortness of breath.  Protocols used: CORONAVIRUS (COVID-19) DIAGNOSED OR SUSPECTED-A-AH

## 2020-02-14 ENCOUNTER — Other Ambulatory Visit: Payer: Self-pay

## 2020-02-14 ENCOUNTER — Emergency Department (HOSPITAL_COMMUNITY)
Admission: EM | Admit: 2020-02-14 | Discharge: 2020-02-14 | Disposition: A | Discharge status: Home / Self Care | Payer: Self-pay | Attending: Emergency Medicine | Admitting: Emergency Medicine

## 2020-02-14 ENCOUNTER — Other Ambulatory Visit (HOSPITAL_COMMUNITY): Payer: Self-pay

## 2020-02-14 ENCOUNTER — Emergency Department (HOSPITAL_COMMUNITY): Payer: Self-pay

## 2020-02-14 DIAGNOSIS — R1031 Right lower quadrant pain: Secondary | ICD-10-CM | POA: Insufficient documentation

## 2020-02-14 DIAGNOSIS — F17210 Nicotine dependence, cigarettes, uncomplicated: Secondary | ICD-10-CM | POA: Insufficient documentation

## 2020-02-14 DIAGNOSIS — Z79899 Other long term (current) drug therapy: Secondary | ICD-10-CM | POA: Insufficient documentation

## 2020-02-14 DIAGNOSIS — D219 Benign neoplasm of connective and other soft tissue, unspecified: Secondary | ICD-10-CM

## 2020-02-14 DIAGNOSIS — M549 Dorsalgia, unspecified: Secondary | ICD-10-CM | POA: Insufficient documentation

## 2020-02-14 DIAGNOSIS — I10 Essential (primary) hypertension: Secondary | ICD-10-CM | POA: Insufficient documentation

## 2020-02-14 DIAGNOSIS — N83201 Unspecified ovarian cyst, right side: Secondary | ICD-10-CM

## 2020-02-14 DIAGNOSIS — Z975 Presence of (intrauterine) contraceptive device: Secondary | ICD-10-CM | POA: Insufficient documentation

## 2020-02-14 DIAGNOSIS — M5441 Lumbago with sciatica, right side: Secondary | ICD-10-CM

## 2020-02-14 DIAGNOSIS — R112 Nausea with vomiting, unspecified: Secondary | ICD-10-CM | POA: Insufficient documentation

## 2020-02-14 DIAGNOSIS — R102 Pelvic and perineal pain: Secondary | ICD-10-CM

## 2020-02-14 LAB — URINALYSIS, ROUTINE W REFLEX MICROSCOPIC
Bacteria, UA: NONE SEEN
Bilirubin Urine: NEGATIVE
Glucose, UA: NEGATIVE mg/dL
Ketones, ur: NEGATIVE mg/dL
Leukocytes,Ua: NEGATIVE
Nitrite: NEGATIVE
Protein, ur: NEGATIVE mg/dL
Specific Gravity, Urine: 1.006 (ref 1.005–1.030)
pH: 7 (ref 5.0–8.0)

## 2020-02-14 LAB — CBC WITH DIFFERENTIAL/PLATELET
Abs Immature Granulocytes: 0.01 10*3/uL (ref 0.00–0.07)
Basophils Absolute: 0.1 10*3/uL (ref 0.0–0.1)
Basophils Relative: 1 %
Eosinophils Absolute: 0.1 10*3/uL (ref 0.0–0.5)
Eosinophils Relative: 2 %
HCT: 47.3 % — ABNORMAL HIGH (ref 36.0–46.0)
Hemoglobin: 15.7 g/dL — ABNORMAL HIGH (ref 12.0–15.0)
Immature Granulocytes: 0 %
Lymphocytes Relative: 42 %
Lymphs Abs: 2.1 10*3/uL (ref 0.7–4.0)
MCH: 30.2 pg (ref 26.0–34.0)
MCHC: 33.2 g/dL (ref 30.0–36.0)
MCV: 91 fL (ref 80.0–100.0)
Monocytes Absolute: 0.5 10*3/uL (ref 0.1–1.0)
Monocytes Relative: 9 %
Neutro Abs: 2.3 10*3/uL (ref 1.7–7.7)
Neutrophils Relative %: 46 %
Platelets: 254 10*3/uL (ref 150–400)
RBC: 5.2 MIL/uL — ABNORMAL HIGH (ref 3.87–5.11)
RDW: 13.2 % (ref 11.5–15.5)
WBC: 5 10*3/uL (ref 4.0–10.5)
nRBC: 0 % (ref 0.0–0.2)

## 2020-02-14 LAB — COMPREHENSIVE METABOLIC PANEL
ALT: 16 U/L (ref 0–44)
AST: 19 U/L (ref 15–41)
Albumin: 3.6 g/dL (ref 3.5–5.0)
Alkaline Phosphatase: 69 U/L (ref 38–126)
Anion gap: 14 (ref 5–15)
BUN: 10 mg/dL (ref 6–20)
CO2: 20 mmol/L — ABNORMAL LOW (ref 22–32)
Calcium: 9.6 mg/dL (ref 8.9–10.3)
Chloride: 104 mmol/L (ref 98–111)
Creatinine, Ser: 0.95 mg/dL (ref 0.44–1.00)
GFR calc Af Amer: >60 mL/min (ref >60)
GFR calc non Af Amer: >60 mL/min (ref >60)
Glucose, Bld: 115 mg/dL — ABNORMAL HIGH (ref 70–99)
Potassium: 4.2 mmol/L (ref 3.5–5.1)
Sodium: 138 mmol/L (ref 135–145)
Total Bilirubin: 0.9 mg/dL (ref 0.3–1.2)
Total Protein: 7.1 g/dL (ref 6.5–8.1)

## 2020-02-14 LAB — D-DIMER, QUANTITATIVE: D-Dimer, Quant: 0.38 ug/mL-FEU (ref 0.00–0.50)

## 2020-02-14 LAB — I-STAT BETA HCG BLOOD, ED (MC, WL, AP ONLY): I-stat hCG, quantitative: <5 m[IU]/mL (ref <5)

## 2020-02-14 LAB — LIPASE, BLOOD: Lipase: 167 U/L — ABNORMAL HIGH (ref 11–51)

## 2020-02-14 LAB — TROPONIN I (HIGH SENSITIVITY): Troponin I (High Sensitivity): 3 ng/L (ref <18)

## 2020-02-14 MED ORDER — ONDANSETRON HCL 4 MG/2ML IJ SOLN
4.0000 mg | Freq: Once | INTRAMUSCULAR | Status: AC
  Administered 2020-02-14: 4 mg via INTRAVENOUS
  Filled 2020-02-14: qty 2

## 2020-02-14 MED ORDER — HYDROMORPHONE HCL 1 MG/ML IJ SOLN
1.0000 mg | Freq: Once | INTRAMUSCULAR | Status: AC
  Administered 2020-02-14: 1 mg via INTRAVENOUS
  Filled 2020-02-14: qty 1

## 2020-02-14 MED ORDER — METHOCARBAMOL 500 MG PO TABS: 500.0000 mg | ORAL_TABLET | Freq: Three times a day (TID) | ORAL | 0 refills | Status: DC | PRN

## 2020-02-14 MED ORDER — SODIUM CHLORIDE 0.9 % IV BOLUS
1000.0000 mL | Freq: Once | INTRAVENOUS | Status: AC
  Administered 2020-02-14: 1000 mL via INTRAVENOUS

## 2020-02-14 MED ORDER — HYDROMORPHONE HCL 1 MG/ML IJ SOLN
0.5000 mg | Freq: Once | INTRAMUSCULAR | Status: AC
  Administered 2020-02-14: 0.5 mg via INTRAVENOUS
  Filled 2020-02-14: qty 1

## 2020-02-14 MED ORDER — KETOROLAC TROMETHAMINE 30 MG/ML IJ SOLN
30.0000 mg | Freq: Once | INTRAMUSCULAR | Status: AC
  Administered 2020-02-14: 30 mg via INTRAVENOUS
  Filled 2020-02-14: qty 1

## 2020-02-14 MED ORDER — HYDROCODONE-ACETAMINOPHEN 5-325 MG PO TABS: 1.0000 | ORAL_TABLET | Freq: Four times a day (QID) | ORAL | 0 refills | Status: DC | PRN

## 2020-02-14 MED ORDER — PREDNISONE 20 MG PO TABS: 40.0000 mg | ORAL_TABLET | Freq: Every day | ORAL | 0 refills | Status: DC

## 2020-02-14 MED ORDER — IOHEXOL 300 MG/ML  SOLN
100.0000 mL | Freq: Once | INTRAMUSCULAR | Status: AC | PRN
  Administered 2020-02-14: 100 mL via INTRAVENOUS

## 2020-02-14 NOTE — ED Provider Notes (Signed)
[facility] Provider Note   CSN: [id number] Arrival date & time: 02/14/20  9518     History Chief Complaint  Patient presents with  . Back Pain    Angela Burton is a 49 y.o. female.  The history is provided by the patient and medical records. No language interpreter was used.  Back Pain  Angela Burton is a 49 y.o. female who presents to the Emergency Department complaining of back pain.  She presents to the ED complaining of severe right sided back pain that started three days ago.  Pain started after returning from [place] for vacation and sleeping for two days.  Pain is described as sharp and constant, worse with movement and breathing.  She has associated nausea and vomiting.  Denies fevers, chest pain, abdominal pain, diarrhea, dysuria.  No prior similar sxs. No known injuries.  Has a hx/o HTN.  Has a ParaGard IUD placed three years ago. Does not currently have an OB/GYN. LMP was two months ago and was a long cycle. It is normal for her to have irregular cycles. She is sexually active but uses protection. No new sexual partners. No vaginal discharge..      Past Medical History:  Diagnosis Date  . Asthma    albuterol  . Hypertension   . Obesity     There are no problems to display for this patient.   Past Surgical History:  Procedure Laterality Date  . DILATION AND CURETTAGE OF UTERUS    . TUBAL LIGATION       OB History    Gravida  3   Para  2   Term  2   Preterm  0   AB  1   Living  2     SAB  0   TAB  1   Ectopic  0   Multiple  0   Live Births  20           No family history on file.  Social History   Tobacco Use  . Smoking status: Current Every Day Smoker    Packs/day: 0.25    Types: Cigarettes  . Smokeless tobacco: Never Used  Substance Use Topics  . Alcohol use: Not Currently    Comment: rarely  . Drug use: Yes    Types: Marijuana    Comment: last used 08/15/18    Home  Medications Prior to Admission medications   Medication Sig Start Date End Date Taking? Authorizing Provider  acetaminophen (TYLENOL) 500 MG tablet Take 1,000 mg by mouth every 6 (six) hours as needed for moderate pain or headache.   Yes [provider]  albuterol (PROVENTIL HFA;VENTOLIN HFA) 108 (90 Base) MCG/ACT inhaler Inhale 1-2 puffs into the lungs every 6 (six) hours as needed for wheezing or shortness of breath. Patient not taking: Reported on 02/14/2020 01/01/16   Shary Decamp, PA-C  hydrochlorothiazide (HYDRODIURIL) 25 MG tablet Take 1 tablet (25 mg total) by mouth daily. Patient not taking: Reported on 02/14/2020 08/17/18   Elvera Maria, CNM  megestrol (MEGACE) 40 MG tablet Take 1 tablet (40 mg total) by mouth 2 (two) times daily. Patient not taking: Reported on 02/14/2020 08/17/18   Fatima Blank A, CNM    Allergies    Sulfa antibiotics, Aspirin, and Erythromycin  Review of Systems   Review of Systems  Musculoskeletal: Positive for back pain.  All other systems reviewed and are negative.   Physical Exam Updated Vital Signs BP Marland Kitchen)  146/83 (BP Location: Right Arm)   Pulse (!) 52   Temp 98.5 F (36.9 C) (Oral)   Resp 18   Ht 5\' 6"  (1.676 m)   Wt 105.7 kg   SpO2 100%   BMI 37.61 kg/m   Physical Exam Vitals and nursing note reviewed.  Constitutional:      General: She is in acute distress.     Appearance: She is well-developed.  HENT:     Head: Normocephalic and atraumatic.  Cardiovascular:     Rate and Rhythm: Normal rate and regular rhythm.     Heart sounds: No murmur heard.   Pulmonary:     Effort: Pulmonary effort is normal. No respiratory distress.     Breath sounds: Normal breath sounds.  Abdominal:     Palpations: Abdomen is soft.     Tenderness: There is no guarding or rebound.     Comments: Moderate RUQ tenderness to palpation.    Musculoskeletal:        General: No tenderness.     Comments: 2+ DP pulses bilaterally  Skin:     General: Skin is warm and dry.  Neurological:     Mental Status: She is alert and oriented to person, place, and time.     Comments: Sensation to light touch in BLE.  Pain limits strength testing in BLE on initial assessment.   Psychiatric:        Behavior: Behavior normal.     ED Results / Procedures / Treatments   Labs (all labs ordered are listed, but only abnormal results are displayed) Labs Reviewed  COMPREHENSIVE METABOLIC PANEL - Abnormal; Notable for the following components:      Result Value   CO2 20 (*)    Glucose, Bld 115 (*)    All other components within normal limits  LIPASE, BLOOD - Abnormal; Notable for the following components:   Lipase 167 (*)    All other components within normal limits  CBC WITH DIFFERENTIAL/PLATELET - Abnormal; Notable for the following components:   RBC 5.20 (*)    Hemoglobin 15.7 (*)    HCT 47.3 (*)    All other components within normal limits  URINALYSIS, ROUTINE W REFLEX MICROSCOPIC - Abnormal; Notable for the following components:   Color, Urine STRAW (*)    Hgb urine dipstick SMALL (*)    All other components within normal limits  D-DIMER, QUANTITATIVE (NOT AT [facility])  I-STAT BETA HCG BLOOD, ED ([facility], [facility], [facility] ONLY)  TROPONIN I (HIGH SENSITIVITY)  TROPONIN I (HIGH SENSITIVITY)    EKG None  Radiology CT Abdomen Pelvis W Contrast  Result Date: 02/14/2020 CLINICAL DATA:  Right abdominal pain.  Back pain. EXAM: CT ABDOMEN AND PELVIS WITH CONTRAST TECHNIQUE: Multidetector CT imaging of the abdomen and pelvis was performed using the standard protocol following bolus administration of intravenous contrast. CONTRAST:  173mL OMNIPAQUE IOHEXOL 300 MG/ML  SOLN COMPARISON:  Ultrasound abdomen 09/19/2011 FINDINGS: Lower chest: No acute abnormality. Hepatobiliary: No focal liver abnormality is seen. No gallstones, gallbladder wall thickening, or biliary dilatation. Pancreas: Unremarkable. No pancreatic ductal dilatation or surrounding inflammatory  changes. Spleen: Normal in size without focal abnormality. Adrenals/Urinary Tract: Adrenal glands are unremarkable. Kidneys are normal, without renal calculi, focal lesion, or hydronephrosis. Bladder is unremarkable. Stomach/Bowel: Stomach is within normal limits. Appendix appears normal. No evidence of bowel wall thickening, distention, or inflammatory changes. Vascular/Lymphatic: No significant vascular findings are present. No enlarged abdominal or pelvic lymph nodes. Reproductive: Intrauterine device within the uterus.  Ill-defined 6 cm right uterine mass with central low attenuation likely reflecting necrosis. 8.9 x 8.6 cm hypodense, fluid attenuating right adnexal mass which appears to arise from the right ovary. Other: No abdominal wall hernia or abnormality. No abdominopelvic ascites. Musculoskeletal: No acute osseous abnormality. No aggressive osseous lesion. IMPRESSION: 1. No acute abdominal or pelvic pathology. 2. Large 8.9 x 8.6 cm indeterminate right ovarian cystic mass. Recommend further characterization with a non emergent MRI of the pelvis without and with intravenous contrast. 3. Ill-defined 6 cm right uterine mass with central low attenuation likely reflecting a large fibroid with necrosis. Electronically Signed   By: Kathreen Devoid   On: 02/14/2020 13:41   DG Chest Port 1 View  Result Date: 02/14/2020 CLINICAL DATA:  Right chest pain and dyspnea EXAM: PORTABLE CHEST 1 VIEW COMPARISON:  07/04/2017 chest radiograph. FINDINGS: Stable cardiomediastinal silhouette with top-normal heart size. No pneumothorax. No pleural effusion. Lungs appear clear, with no acute consolidative airspace disease and no pulmonary edema. IMPRESSION: No active disease. Electronically Signed   By: Ilona Sorrel M.D.   On: 02/14/2020 10:48   US PELVIC COMPLETE W TRANSVAGINAL AND TORSION R/O  Result Date: 02/14/2020 CLINICAL DATA:  Right adnexal pain x2 days. EXAM: TRANSABDOMINAL AND TRANSVAGINAL ULTRASOUND OF PELVIS  DOPPLER ULTRASOUND OF OVARIES TECHNIQUE: Both transabdominal and transvaginal ultrasound examinations of the pelvis were performed. Transabdominal technique was performed for global imaging of the pelvis including uterus, ovaries, adnexal regions, and pelvic cul-de-sac. It was necessary to proceed with endovaginal exam following the transabdominal exam to visualize the uterus, endometrium, bilateral ovaries and bilateral adnexa. Color and duplex Doppler ultrasound was utilized to evaluate blood flow to the ovaries. COMPARISON:  None. FINDINGS: Uterus Measurements: 10.9 cm x 8.4 cm x 8.6 cm = volume: 414.7 mL. A 7.6 cm x 6.6 cm x 6.9 cm heterogeneous hypoechoic uterine mass is seen within the uterine fundus on the right. Endometrium Thickness: N/A. The endometrium is poorly visualized. An IUD is not identified. Right ovary Measurements: 12.4 cm x 7.6 cm x 10.9 cm = volume: 516.9 mL. A 10.2 cm x 7.9 cm x 9.6 cm anechoic area is seen within the right ovary. Left ovary The left ovary is not visualized. Pulsed Doppler evaluation of the right ovary demonstrates normal low-resistance arterial and venous waveforms. Other findings No abnormal free fluid. IMPRESSION: 1. Findings likely consistent with a large heterogeneous uterine fibroid. 2. Subsequently limited evaluation of the endometrium without visualization of an IUD. 3. Large right ovarian cyst. 4. Nonvisualization of the left ovary. Electronically Signed   By: Virgina Norfolk M.D.   On: 02/14/2020 16:16    Procedures Procedures (including critical care time)  Medications Ordered in ED Medications  HYDROmorphone (DILAUDID) injection 1 mg (1 mg Intravenous Given 02/14/20 1032)  ondansetron (ZOFRAN) injection 4 mg (4 mg Intravenous Given 02/14/20 1031)  sodium chloride 0.9 % bolus 1,000 mL (0 mLs Intravenous Stopped 02/14/20 1218)  HYDROmorphone (DILAUDID) injection 1 mg (1 mg Intravenous Given 02/14/20 1208)  iohexol (OMNIPAQUE) 300 MG/ML solution 100 mL (100  mLs Intravenous Contrast Given 02/14/20 1246)  HYDROmorphone (DILAUDID) injection 0.5 mg (0.5 mg Intravenous Given 02/14/20 1510)  ondansetron (ZOFRAN) injection 4 mg (4 mg Intravenous Given 02/14/20 1510)    ED Course  I have reviewed the triage vital signs and the nursing notes.  Pertinent labs & imaging results that were available during my care of the patient were reviewed by me and considered in my medical decision making (see chart  for details).    MDM Rules/Calculators/A&P                         Patient here for evaluation of severe right-sided flank pain that radiates down to her right lower extremity. She has significant tenderness and discomfort on evaluation. Given recent travel, considered PE but given her low D dimer feel this is unlikely. CT abdomen pelvis obtained, which demonstrates a large ovarian cyst. Will obtain pelvic ultrasound to rule out torsion. She was treated with Dilaudid for pain control. Patient care transferred pending pelvic ultrasound.  Final Clinical Impression(s) / ED Diagnoses Final diagnoses:  Right adnexal tenderness    Rx / DC Orders ED Discharge Orders    None       Quintella Reichert, MD 02/14/20 1630

## 2020-02-14 NOTE — ED Triage Notes (Signed)
Pt arrives by [facility] from home with complaints of back pain radiating to both legs. Pt reports she was standing in the kitchen when she felt sharp back pain that went to her legs. Pt states its spasming that wont stop and I feel like its "labor " in my back. Pt tearful during triage.

## 2020-02-14 NOTE — Discharge Instructions (Addendum)
Follow-up with gynecology for the ovarian cyst and fibroid.  Follow-up with primary care doctor for the back pain.

## 2020-03-24 ENCOUNTER — Ambulatory Visit: Payer: Self-pay | Admitting: Critical Care Medicine

## 2020-03-24 ENCOUNTER — Other Ambulatory Visit: Payer: Self-pay

## 2020-03-24 ENCOUNTER — Ambulatory Visit (INDEPENDENT_AMBULATORY_CARE_PROVIDER_SITE_OTHER): Payer: Self-pay | Admitting: Obstetrics and Gynecology

## 2020-03-24 ENCOUNTER — Encounter: Payer: Self-pay | Admitting: Obstetrics and Gynecology

## 2020-03-24 VITALS — BP 154/88 | HR 65 | Resp 18 | Ht 66.0 in | Wt 243.0 lb

## 2020-03-24 DIAGNOSIS — I10 Essential (primary) hypertension: Secondary | ICD-10-CM

## 2020-03-24 DIAGNOSIS — Z720 Tobacco use: Secondary | ICD-10-CM

## 2020-03-24 DIAGNOSIS — I152 Hypertension secondary to endocrine disorders: Secondary | ICD-10-CM | POA: Insufficient documentation

## 2020-03-24 DIAGNOSIS — R0683 Snoring: Secondary | ICD-10-CM

## 2020-03-24 DIAGNOSIS — D259 Leiomyoma of uterus, unspecified: Secondary | ICD-10-CM

## 2020-03-24 DIAGNOSIS — N83209 Unspecified ovarian cyst, unspecified side: Secondary | ICD-10-CM | POA: Insufficient documentation

## 2020-03-24 DIAGNOSIS — Z87891 Personal history of nicotine dependence: Secondary | ICD-10-CM | POA: Insufficient documentation

## 2020-03-24 DIAGNOSIS — N83201 Unspecified ovarian cyst, right side: Secondary | ICD-10-CM

## 2020-03-24 DIAGNOSIS — Z975 Presence of (intrauterine) contraceptive device: Secondary | ICD-10-CM | POA: Insufficient documentation

## 2020-03-24 DIAGNOSIS — D252 Subserosal leiomyoma of uterus: Secondary | ICD-10-CM

## 2020-03-24 HISTORY — DX: Leiomyoma of uterus, unspecified: D25.9

## 2020-03-24 MED ORDER — IBUPROFEN 800 MG PO TABS: 800.0000 mg | ORAL_TABLET | Freq: Three times a day (TID) | ORAL | 2 refills | Status: DC | PRN

## 2020-03-24 MED ORDER — LOSARTAN POTASSIUM-HCTZ 50-12.5 MG PO TABS: 1.0000 | ORAL_TABLET | Freq: Every day | ORAL | 3 refills | Status: DC

## 2020-03-24 MED FILL — LOSARTAN-HCTZ 50-12.5 MG TA: 50-12.5 | 30 days supply | Qty: 30 | Fill #0

## 2020-03-24 NOTE — Progress Notes (Signed)
Subjective:    Patient ID: Angela Burton is a 49 y.o. female presenting with Fibroids  on 03/24/2020  HPI: 49 yo G3P2, SVD x 2 TAB x 1 was seen in the ED for right sided back pain on 02/14/2020.  During the evaluation a moderate sized fibroid and a large right sided ovarian cyst were noted.  In hindsight, the patient noted she has had abdominal pain for about a year.  She describes the pain as crampy and constant with occasional sharp pain.  Ibuprofen taken 400 mg daily improves the pain.  Pt denies nausea/vomiting, dysuria, constipation/diarrhea and dyspareunia.  Pt also is aware of having a paraguard IUD which was placed 3 years ago even though she had bilateral tubal ligation.  She previously had a mirena IUD which was a great help to her heavy irregular bleeding.  The IUD was seen on CT scan but not on ultrasound.  The patient is unsure when the strings were last seen since she does not have consistent medical care.    Review of Systems  Constitutional: Negative.   HENT: Negative.   Eyes: Negative.   Respiratory: Negative.   Cardiovascular: Negative.   Gastrointestinal: Positive for abdominal pain. Negative for constipation, diarrhea, nausea and vomiting.  Genitourinary: Positive for menstrual problem. Negative for dyspareunia and dysuria.  Musculoskeletal: Positive for back pain.  Neurological: Negative.   Psychiatric/Behavioral: Negative.       Objective:    BP (!) 154/99   Pulse 65   Wt 243 lb 4.8 oz (110.4 kg)   LMP 01/05/2020 (Approximate)   BMI 39.27 kg/m  Physical Exam Vitals reviewed.  Constitutional:      Appearance: Normal appearance.  HENT:     Head: Normocephalic and atraumatic.     Nose: Nose normal.     Mouth/Throat:     Mouth: Mucous membranes are dry.  Eyes:     Extraocular Movements: Extraocular movements intact.  Cardiovascular:     Rate and Rhythm: Normal rate and regular rhythm.     Heart sounds: Normal heart sounds.  Pulmonary:     Effort:  Pulmonary effort is normal.     Breath sounds: Normal breath sounds.  Abdominal:     General: Abdomen is flat.     Palpations: Abdomen is soft.     Comments: Mild/moderate tenderness in the RLQ  Genitourinary:    Comments: Cvx: WNL. No IUD string noted, could not retrieve with cytobrush  Uterus : normal size, nontender  right adnexa, suggestion of mass noted Musculoskeletal:     Cervical back: Normal range of motion.  Neurological:     Mental Status: She is alert.    CLINICAL DATA:  Right adnexal pain x2 days.   EXAM: TRANSABDOMINAL AND TRANSVAGINAL ULTRASOUND OF PELVIS   DOPPLER ULTRASOUND OF OVARIES   TECHNIQUE: Both transabdominal and transvaginal ultrasound examinations of the pelvis were performed. Transabdominal technique was performed for global imaging of the pelvis including uterus, ovaries, adnexal regions, and pelvic cul-de-sac.   It was necessary to proceed with endovaginal exam following the transabdominal exam to visualize the uterus, endometrium, bilateral ovaries and bilateral adnexa. Color and duplex Doppler ultrasound was utilized to evaluate blood flow to the ovaries.   COMPARISON:  None.   FINDINGS: Uterus   Measurements: 10.9 cm x 8.4 cm x 8.6 cm = volume: 414.7 mL. A 7.6 cm x 6.6 cm x 6.9 cm heterogeneous hypoechoic uterine mass is seen within the uterine fundus on the right.  Endometrium   Thickness: N/A. The endometrium is poorly visualized. An IUD is not identified.   Right ovary   Measurements: 12.4 cm x 7.6 cm x 10.9 cm = volume: 516.9 mL. A 10.2 cm x 7.9 cm x 9.6 cm anechoic area is seen within the right ovary.   Left ovary   The left ovary is not visualized.   Pulsed Doppler evaluation of the right ovary demonstrates normal low-resistance arterial and venous waveforms.   Other findings   No abnormal free fluid.   IMPRESSION: 1. Findings likely consistent with a large heterogeneous uterine fibroid. 2. Subsequently  limited evaluation of the endometrium without visualization of an IUD. 3. Large right ovarian cyst. 4. Nonvisualization of the left ovary.      Assessment & Plan:  Right ovarian cyst:  Order CEA and Ca-125 as tumor markers, if WNL, will schedule patient for operative laparoscopy, right ovarian cystectomy, possible right oophorectomy.  rx for ibuprofen for pain control.  Fibroid:  Will evaluate during laparoscopy  IUD: repeat u/s to see if the IUD is present, if not seen order abdominal xray to look for the device.  This must be done prior to surgery in case the device needs to be looked for during the procedure.  If the device is present she may need hysteroscopic removal at time of laparoscopy.  Return in about 3 weeks (around 04/14/2020) for f/u.  Angela Burton 03/24/2020 11:56 AM

## 2020-03-24 NOTE — Assessment & Plan Note (Signed)
Ovarian cyst and fibroid uterine  Per gynecology

## 2020-03-24 NOTE — Patient Instructions (Addendum)
If you are in need of transportation to get to and from your appointments in our office.  You can reach Transportation Services by calling [phone] Monday - Friday  7am-6pm.   [facility] [address], [place], [place] [address] [phone]   or  [email or web address] **SNAP/EBT/ Other nutritional benefits  [facility] [address], [place], [place] [address] [phone]  or  [email or web address] **WIC for  women who are pregnant and postpartum, infants and children up to 31 years old  [facility] [address], [place], [place] [address] [phone]   or   www.[email or web address]  **Food pantry  [employer] [facility] [place], [place], [place] [address] [phone]   or   [email or web address]  **Emergency food and prepared meals  [facility] [address] [place], [place] [address] [phone]   or   [email or web address].[email or web address] **Food pantry  [facility] [address], [place], [place] [address] [phone]   or   [email or web address] **Food pantry  [employer] [facility] [address] [place], [place] [address] [phone] **Food pantry  [facility] [address], [place], [place] [address] [phone]   or   [email or web address]  Insurance underwriter and prepared meals  [facility] [address] [place], [place], [place], [place] [address] [email or web address]  **Food pantry  [employer] [facility] [address] [place], [place] [address] [phone]   or   www.lbcnow.org  **Food pantry  [facility] [address],  [place], [place] [address] [phone]   or   [email or web address]-[email or web address] **Food pantry, nutrition education, gardening activities  [facility] [address] [place], [place] [address] [phone] **Food pantry  [facility] [address], [place], [place] [address] [phone]   or   www.[email or web address].[email or web address] [place], [place], [place] [address] [phone]   or   http://[email or web address]-resources-[email or web address] [facility] [address] [place], [place] [address] [phone]   or   [email or web address]  **Food pantry  [facility] [address], [place],  [address] [phone]   or   [email or web address] **Food pantry

## 2020-03-24 NOTE — Progress Notes (Signed)
Patient has eaten today. Patient complains of abdominal pain and the concern is being addressed.

## 2020-03-24 NOTE — Assessment & Plan Note (Signed)
History of hypertension currently not well controlled and not on medication  The patient was in the emergency room in June for abdominal pain lab studies at that time showed normal kidney and renal function and normal CBC  Plan is to begin losartan HCT 50/12.5   1 daily and will have the patient establish for primary care in the next few weeks at the [facility] on my schedule

## 2020-03-24 NOTE — Assessment & Plan Note (Signed)
There is a history of snoring the patient relates she may have underlying sleep apnea we will discuss this further at the next office visit

## 2020-03-24 NOTE — Patient Instructions (Signed)
Start losartan hct one daily 50/12.5  Follow diet below   [facility] [name] [facility]    Hypertension, Adult High blood pressure (hypertension) is when the force of blood pumping through the arteries is too strong. The arteries are the blood vessels that carry blood from the heart throughout the body. Hypertension forces the heart to work harder to pump blood and may cause arteries to become narrow or stiff. Untreated or uncontrolled hypertension can cause a heart attack, heart failure, a stroke, kidney disease, and other problems. A blood pressure reading consists of a higher number over a lower number. Ideally, your blood pressure should be below 120/80. The first ("top") number is called the systolic pressure. It is a measure of the pressure in your arteries as your heart beats. The second ("bottom") number is called the diastolic pressure. It is a measure of the pressure in your arteries as the heart relaxes. What are the causes? The exact cause of this condition is not known. There are some conditions that result in or are related to high blood pressure. What increases the risk? Some risk factors for high blood pressure are under your control. The following factors may make you more likely to develop this condition:  Smoking.  Having type 2 diabetes mellitus, high cholesterol, or both.  Not getting enough exercise or physical activity.  Being overweight.  Having too much fat, sugar, calories, or salt (sodium) in your diet.  Drinking too much alcohol. Some risk factors for high blood pressure may be difficult or impossible to change. Some of these factors include:  Having chronic kidney disease.  Having a family history of high blood pressure.  Age. Risk increases with age.  Race. You may be at higher risk if you are African American.  Gender. Men are at higher risk than women before age 20. After age 28, women are at higher risk than  men.  Having obstructive sleep apnea.  Stress. What are the signs or symptoms? High blood pressure may not cause symptoms. Very high blood pressure (hypertensive crisis) may cause:  Headache.  Anxiety.  Shortness of breath.  Nosebleed.  Nausea and vomiting.  Vision changes.  Severe chest pain.  Seizures. How is this diagnosed? This condition is diagnosed by measuring your blood pressure while you are seated, with your arm resting on a flat surface, your legs uncrossed, and your feet flat on the floor. The cuff of the blood pressure monitor will be placed directly against the skin of your upper arm at the level of your heart. It should be measured at least twice using the same arm. Certain conditions can cause a difference in blood pressure between your right and left arms. Certain factors can cause blood pressure readings to be lower or higher than normal for a short period of time:  When your blood pressure is higher when you are in a health care provider's office than when you are at home, this is called white coat hypertension. Most people with this condition do not need medicines.  When your blood pressure is higher at home than when you are in a health care provider's office, this is called masked hypertension. Most people with this condition may need medicines to control blood pressure. If you have a high blood pressure reading during one visit or you have normal blood pressure with other risk factors, you may be asked to:  Return on a different day to have your blood pressure checked  again.  Monitor your blood pressure at home for 1 week or longer. If you are diagnosed with hypertension, you may have other blood or imaging tests to help your health care provider understand your overall risk for other conditions. How is this treated? This condition is treated by making healthy lifestyle changes, such as eating healthy foods, exercising more, and reducing your alcohol  intake. Your health care provider may prescribe medicine if lifestyle changes are not enough to get your blood pressure under control, and if:  Your systolic blood pressure is above 130.  Your diastolic blood pressure is above 80. Your personal target blood pressure may vary depending on your medical conditions, your age, and other factors. Follow these instructions at home: Eating and drinking   Eat a diet that is high in fiber and potassium, and low in sodium, added sugar, and fat. An example eating plan is called the DASH (Dietary Approaches to Stop Hypertension) diet. To eat this way: ? Eat plenty of fresh fruits and vegetables. Try to fill one half of your plate at each meal with fruits and vegetables. ? Eat whole grains, such as whole-wheat pasta, brown rice, or whole-grain bread. Fill about one fourth of your plate with whole grains. ? Eat or drink low-fat dairy products, such as skim milk or low-fat yogurt. ? Avoid fatty cuts of meat, processed or cured meats, and poultry with skin. Fill about one fourth of your plate with lean proteins, such as fish, chicken without skin, beans, eggs, or tofu. ? Avoid pre-made and processed foods. These tend to be higher in sodium, added sugar, and fat.  Reduce your daily sodium intake. Most people with hypertension should eat less than 1,500 mg of sodium a day.  Do not drink alcohol if: ? Your health care provider tells you not to drink. ? You are pregnant, may be pregnant, or are planning to become pregnant.  If you drink alcohol: ? Limit how much you use to:  0-1 drink a day for women.  0-2 drinks a day for men. ? Be aware of how much alcohol is in your drink. In the [place] one drink equals one 12 oz bottle of beer (355 mL), one 5 oz glass of wine (148 mL), or one 1 oz glass of hard liquor (44 mL). Lifestyle   Work with your health care provider to maintain a healthy body weight or to lose weight. Ask what an ideal weight is for  you.  Get at least 30 minutes of exercise most days of the week. Activities may include walking, swimming, or biking.  Include exercise to strengthen your muscles (resistance exercise), such as Pilates or lifting weights, as part of your weekly exercise routine. Try to do these types of exercises for 30 minutes at least 3 days a week.  Do not use any products that contain nicotine or tobacco, such as cigarettes, e-cigarettes, and chewing tobacco. If you need help quitting, ask your health care provider.  Monitor your blood pressure at home as told by your health care provider.  Keep all follow-up visits as told by your health care provider. This is important. Medicines  Take over-the-counter and prescription medicines only as told by your health care provider. Follow directions carefully. Blood pressure medicines must be taken as prescribed.  Do not skip doses of blood pressure medicine. Doing this puts you at risk for problems and can make the medicine less effective.  Ask your health care provider about side effects or reactions  to medicines that you should watch for. Contact a health care provider if you:  Think you are having a reaction to a medicine you are taking.  Have headaches that keep coming back (recurring).  Feel dizzy.  Have swelling in your ankles.  Have trouble with your vision. Get help right away if you:  Develop a severe headache or confusion.  Have unusual weakness or numbness.  Feel faint.  Have severe pain in your chest or abdomen.  Vomit repeatedly.  Have trouble breathing. Summary  Hypertension is when the force of blood pumping through your arteries is too strong. If this condition is not controlled, it may put you at risk for serious complications.  Your personal target blood pressure may vary depending on your medical conditions, your age, and other factors. For most people, a normal blood pressure is less than 120/80.  Hypertension is  treated with lifestyle changes, medicines, or a combination of both. Lifestyle changes include losing weight, eating a healthy, low-sodium diet, exercising more, and limiting alcohol. This information is not intended to replace advice given to you by your health care provider. Make sure you discuss any questions you have with your health care provider. Document Revised: 04/18/2018 Document Reviewed: 04/18/2018 Elsevier Patient Education  2020 [employer].

## 2020-03-24 NOTE — Assessment & Plan Note (Signed)
Patient does have ongoing tobacco use have recommended continued efforts at smoking cessation I spent about 5 minutes going over this with the patient today

## 2020-03-24 NOTE — Progress Notes (Signed)
[facility] for long, heavy menstrual cycles around 2013. IUD replaced in 2018 with Paragard.  Presented to ED 02/14/20 for worsening sciatica pain. Pelvic US completed at that visit showed fibroids and large right ovarian cyst.  Apolonio Schneiders RN 03/24/20

## 2020-03-24 NOTE — Progress Notes (Signed)
Subjective:    Patient ID: Angela Burton, female    DOB: 22-Mar-1971, 49 y.o.   MRN: [id number]  Las[facility]   this is a 49 year old female who comes to the [facility] today without a primary care provider wishing to have her blood pressure evaluated  The patient was earlier at gynecology and evaluated for ovarian cyst and uterine fibroids and has an ultrasound pending for evaluation of this and also for intrauterine device that is in place  The patient currently smokes 3 cigarettes daily.  The patient is currently not on any antihypertensive medications. There is a past history of asthma but she does not have a current albuterol inhaler.  She was given ibuprofen 800 mg today at the gynecology office for her abdominal pain which is related to the cyst in the ovary and fibroids  The patient denies chest pain shortness of breath headaches or dizziness.      Past Medical History:  Diagnosis Date  . Asthma    albuterol  . Hypertension   . Obesity      Family History  Problem Relation Age of Onset  . Heart attack Mother   . Diabetes Father   . Hypertension Father   . High Cholesterol Father   . Breast cancer Sister   . HIV Sister      Social History   Socioeconomic History  . Marital status: Legally Separated    Spouse name: Not on file  . Number of children: Not on file  . Years of education: Not on file  . Highest education level: Not on file  Occupational History  . Not on file  Tobacco Use  . Smoking status: Current Every Day Smoker    Packs/day: 0.25    Types: Cigarettes  . Smokeless tobacco: Never Used  Substance and Sexual Activity  . Alcohol use: Yes    Alcohol/week: 2.0 standard drinks    Types: 2 Glasses of wine per week    Comment: rarely  . Drug use: Yes    Types: Marijuana    Comment: last used 08/15/18  . Sexual activity: Yes    Birth control/protection: I.U.D., Surgical  Other Topics Concern  . Not on file  Social History Narrative  .  Not on file   Social Determinants of Health   Financial Resource Strain:   . Difficulty of Paying Living Expenses:   Food Insecurity: Food Insecurity Present  . Worried About Charity fundraiser in the Last Year: Sometimes true  . Ran Out of Food in the Last Year: Sometimes true  Transportation Needs: Unmet Transportation Needs  . Lack of Transportation (Medical): Yes  . Lack of Transportation (Non-Medical): Yes  Physical Activity:   . Days of Exercise per Week:   . Minutes of Exercise per Session:   Stress:   . Feeling of Stress :   Social Connections:   . Frequency of Communication with Friends and Family:   . Frequency of Social Gatherings with Friends and Family:   . Attends Religious Services:   . Active Member of Clubs or Organizations:   . Attends Archivist Meetings:   Marland Kitchen Marital Status:   Intimate Partner Violence:   . Fear of Current or Ex-Partner:   . Emotionally Abused:   Marland Kitchen Physically Abused:   . Sexually Abused:      Allergies  Allergen Reactions  . Sulfa Antibiotics Anaphylaxis, Hives, Swelling and Rash  . Aspirin Hives  . Erythromycin Other (See Comments)  Had a seizure.      Outpatient Medications Prior to Visit  Medication Sig Dispense Refill  . acetaminophen (TYLENOL) 500 MG tablet Take 1,000 mg by mouth every 6 (six) hours as needed for moderate pain or headache.    . ibuprofen (ADVIL) 800 MG tablet Take 1 tablet (800 mg total) by mouth every 8 (eight) hours as needed for moderate pain or cramping. 30 tablet 2  . albuterol (PROVENTIL HFA;VENTOLIN HFA) 108 (90 Base) MCG/ACT inhaler Inhale 1-2 puffs into the lungs every 6 (six) hours as needed for wheezing or shortness of breath. (Patient not taking: Reported on 03/24/2020) 1 Inhaler 2  . hydrochlorothiazide (HYDRODIURIL) 25 MG tablet Take 1 tablet (25 mg total) by mouth daily. (Patient not taking: Reported on 03/24/2020) 30 tablet 2   No facility-administered medications prior to visit.     Review of Systems  Constitutional: Positive for unexpected weight change.  HENT: Negative.   Eyes: Positive for visual disturbance.  Respiratory: Negative for cough, shortness of breath, wheezing and stridor.        Snores at night  Cardiovascular: Negative for chest pain.  Gastrointestinal: Positive for abdominal pain. Negative for nausea and vomiting.  Endocrine: Negative.   Genitourinary: Negative.   Neurological: Positive for dizziness and headaches.  Hematological: Negative.   Psychiatric/Behavioral: Negative for self-injury and suicidal ideas.       Objective:   Physical Exam Vitals:   03/24/20 1448  BP: (!) 154/88  Pulse: 65  Resp: 18  SpO2: 100%  Weight: 243 lb (110.2 kg)  Height: 5\' 6"  (1.676 m)    Gen: Pleasant, well-nourished, in no distress,  normal affect  ENT: No lesions,  mouth clear,  oropharynx clear, no postnasal drip  Neck: No JVD, no TMG, no carotid bruits  Lungs: No use of accessory muscles, no dullness to percussion, clear without rales or rhonchi  Cardiovascular: RRR, heart sounds normal, no murmur or gallops, no peripheral edema  Abdomen: soft and NT, no HSM,  BS normal  Musculoskeletal: No deformities, no cyanosis or clubbing  Neuro: alert, non focal  Skin: Warm, no lesions or rashes       Assessment & Plan:  I personally reviewed all images and lab data in the [facility] system as well as any outside material available during this office visit and agree with the  radiology impressions.   Hypertension History of hypertension currently not well controlled and not on medication  The patient was in the emergency room in June for abdominal pain lab studies at that time showed normal kidney and renal function and normal CBC  Plan is to begin losartan HCT 50/12.5   1 daily and will have the patient establish for primary [facility] the [facility]  Snoring There is a history of  snoring the patient relates she may have underlying sleep apnea we will discuss this further at the next office visit  Ovarian cyst Ovarian cyst and fibroid uterine  Per gynecology  Tobacco use Patient does have ongoing tobacco use have recommended continued efforts at smoking cessation I spent about 5 minutes going over this with the patient today   [place] was seen today for annual exam.  Diagnoses and all orders for this visit:  Essential hypertension  Snoring  Cyst of right ovary  Tobacco use  Other orders -     losartan-hydrochlorothiazide (HYZAAR) 50-12.5 MG tablet; Take 1 tablet by mouth daily.

## 2020-03-25 LAB — CA 125: Cancer Antigen (CA) 125: 9.3 U/mL (ref 0.0–38.1)

## 2020-03-25 LAB — CEA: CEA: 1.8 ng/mL (ref 0.0–4.7)

## 2020-03-30 ENCOUNTER — Ambulatory Visit
Admission: RE | Admit: 2020-03-30 | Discharge: 2020-03-30 | Disposition: A | Discharge status: Home / Self Care | Payer: Self-pay | Source: Ambulatory Visit | Attending: Obstetrics and Gynecology | Admitting: Obstetrics and Gynecology

## 2020-03-30 ENCOUNTER — Other Ambulatory Visit: Payer: Self-pay

## 2020-03-30 DIAGNOSIS — Z975 Presence of (intrauterine) contraceptive device: Secondary | ICD-10-CM | POA: Insufficient documentation

## 2020-04-16 ENCOUNTER — Other Ambulatory Visit: Payer: Self-pay

## 2020-04-16 ENCOUNTER — Encounter: Payer: Self-pay | Admitting: Obstetrics and Gynecology

## 2020-04-16 ENCOUNTER — Ambulatory Visit (INDEPENDENT_AMBULATORY_CARE_PROVIDER_SITE_OTHER): Payer: Self-pay | Admitting: Obstetrics and Gynecology

## 2020-04-16 VITALS — BP 138/90 | HR 76 | Ht 66.0 in | Wt 246.0 lb

## 2020-04-16 DIAGNOSIS — D252 Subserosal leiomyoma of uterus: Secondary | ICD-10-CM

## 2020-04-16 DIAGNOSIS — Z975 Presence of (intrauterine) contraceptive device: Secondary | ICD-10-CM

## 2020-04-16 DIAGNOSIS — N83201 Unspecified ovarian cyst, right side: Secondary | ICD-10-CM

## 2020-04-16 DIAGNOSIS — I10 Essential (primary) hypertension: Secondary | ICD-10-CM

## 2020-04-16 NOTE — Patient Instructions (Signed)
Diagnostic Laparoscopy Diagnostic laparoscopy is a procedure to diagnose diseases in the abdomen. It might be done for a variety of reasons, such as to look for scar tissue, cancer, or a reason for abdomen (abdominal) pain. During the procedure, a thin, flexible tube that has a light and a camera on the end (laparoscope) is inserted through an incision in the abdomen. The image from the camera is shown on a monitor to help your surgeon see inside your body. Tell a health care provider about:  Any allergies you have.  All medicines you are taking, including vitamins, herbs, eye drops, creams, and over-the-counter medicines.  Any problems you or family members have had with anesthetic medicines.  Any blood disorders you have.  Any surgeries you have had.  Any medical conditions you have. What are the risks? Generally, this is a safe procedure. However, problems may occur, including:  Infection.  Bleeding.  Allergic reactions to medicines or dyes.  Damage to abdominal structures or organs, such as the intestines, liver, stomach, or spleen. What happens before the procedure? Medicines  Ask your health care provider about: ? Changing or stopping your regular medicines. This is especially important if you are taking diabetes medicines or blood thinners. ? Taking medicines such as aspirin and ibuprofen. These medicines can thin your blood. Do not take these medicines unless your health care provider tells you to take them. ? Taking over-the-counter medicines, vitamins, herbs, and supplements.  You may be given antibiotic medicine to help prevent infection. Staying hydrated Follow instructions from your health care provider about hydration, which may include:  Up to 2 hours before the procedure - you may continue to drink clear liquids, such as water, clear fruit juice, black coffee, and plain tea. Eating and drinking restrictions Follow instructions from your health care provider  about eating and drinking, which may include:  8 hours before the procedure - stop eating heavy meals or foods such as meat, fried foods, or fatty foods.  6 hours before the procedure - stop eating light meals or foods, such as toast or cereal.  6 hours before the procedure - stop drinking milk or drinks that contain milk.  2 hours before the procedure - stop drinking clear liquids. General instructions  Ask your health care provider how your surgical site will be marked or identified.  You may be asked to shower with a germ-killing soap.  Plan to have someone take you home from the hospital or clinic.  Plan to have a responsible adult care for you for at least 24 hours after you leave the hospital or clinic. This is important. What happens during the procedure?   To lower your risk of infection: ? Your health care team will wash or sanitize their hands. ? Hair may be removed from the surgical area. ? Your skin will be washed with soap.  An IV will be inserted into one of your veins.  You will be given a medicine to make you fall asleep (general anesthetic). You may also be given a medicine to help you relax (sedative).  A breathing tube will be placed down your throat to help you breathe during the procedure.  Your abdomen will be filled with an air-like gas so it expands. This will give the surgeon more room to operate and will make your organs easier to see.  Many small incisions will be made in your abdomen.  A laparoscope and other surgical instruments will be inserted into your abdomen through the   incisions.  A tissue sample may be removed from an organ for examination (biopsy). This will depend on the reason why you are having this procedure.  The laparoscope and other instruments will be removed from your abdomen.  The gas will be released.  Your incisions will be closed with stitches (sutures) and covered with a bandage (dressing).  Your breathing tube will be  removed. The procedure may vary among health care providers and hospitals. What happens after the procedure?   Your blood pressure, heart rate, breathing rate, and blood oxygen level will be monitored until the medicines you were given have worn off.  Do not drive for 24 hours if you were given a sedative during your procedure.  It is up to you to get the results of your procedure. Ask your health care provider, or the department that is doing the procedure, when your results will be ready. Summary  Diagnostic laparoscopy is a way to look for problems in the abdomen using small incisions.  Follow instructions from your health care provider about how to prepare for the procedure.  Plan to have a responsible adult care for you for at least 24 hours after you leave the hospital or clinic. This is important. This information is not intended to replace advice given to you by your health care provider. Make sure you discuss any questions you have with your health care provider. Document Revised: 07/21/2017 Document Reviewed: 02/01/2017 Elsevier Patient Education  [employer]. Hysteroscopy Hysteroscopy is a procedure that is used to examine the inside of a woman's womb (uterus). This may be done for various reasons, including:  To look for lumps (tumors) and other growths in the uterus.  To evaluate abnormal bleeding, fibroid tumors, polyps, scar tissue (adhesions), or cancer of the uterus.  To determine the cause of an inability to get pregnant (infertility) or repeated losses of pregnancies (miscarriages).  To find a lost IUD (intrauterine device).  To perform a procedure that permanently prevents pregnancy (sterilization). During this procedure, a thin, flexible tube with a small light and camera (hysteroscope) is used to examine the uterus. The camera sends images to a monitor in the room so that your health care provider can view the inside of your uterus. A hysteroscopy should  be done right after a menstrual period to make sure that you are not pregnant. Tell a health care provider about:  Any allergies you have.  All medicines you are taking, including vitamins, herbs, eye drops, creams, and over-the-counter medicines.  Any problems you or family members have had with the use of anesthetic medicines.  Any blood disorders you have.  Any surgeries you have had.  Any medical conditions you have.  Whether you are pregnant or may be pregnant. What are the risks? Generally, this is a safe procedure. However, problems may occur, including:  Excessive bleeding.  Infection.  Damage to the uterus or other structures or organs.  Allergic reaction to medicines or fluids that are used in the procedure. What happens before the procedure? Staying hydrated Follow instructions from your health care provider about hydration, which may include:  Up to 2 hours before the procedure - you may continue to drink clear liquids, such as water, clear fruit juice, black coffee, and plain tea. Eating and drinking restrictions Follow instructions from your health care provider about eating and drinking, which may include:  8 hours before the procedure - stop eating solid foods and drink clear liquids only  2 hours before  the procedure - stop drinking clear liquids. General instructions  Ask your health care provider about: ? Changing or stopping your normal medicines. This is important if you take diabetes medicines or blood thinners. ? Taking medicines such as aspirin and ibuprofen. These medicines can thin your blood and cause bleeding. Do not take these medicines for 1 week before your procedure, or as told by your health care provider.  Do not use any products that contain nicotine or tobacco for 2 weeks before the procedure. This includes cigarettes and e-cigarettes. If you need help quitting, ask your health care provider.  Medicine may be placed in your cervix the  day before the procedure. This medicine causes the cervix to have a larger opening (dilate). The larger opening makes it easier for the hysteroscope to be inserted into the uterus during the procedure.  Plan to have someone with you for the first 24-48 hours after the procedure, especially if you are given a medicine to make you fall asleep (general anesthetic).  Plan to have someone take you home from the hospital or clinic. What happens during the procedure?  To lower your risk of infection: ? Your health care team will wash or sanitize their hands. ? Your skin will be washed with soap. ? Hair may be removed from the surgical area.  An IV tube will be inserted into one of your veins.  You may be given one or more of the following: ? A medicine to help you relax (sedative). ? A medicine that numbs the area around the cervix (local anesthetic). ? A medicine to make you fall asleep (general anesthetic).  A hysteroscope will be inserted through your vagina and into your uterus.  Air or fluid will be used to enlarge your uterus, enabling your health care provider to see your uterus better. The amount of fluid used will be carefully checked throughout the procedure.  In some cases, tissue may be gently scraped from inside the uterus and sent to a lab for testing (biopsy). The procedure may vary among health care providers and hospitals. What happens after the procedure?  Your blood pressure, heart rate, breathing rate, and blood oxygen level will be monitored until the medicines you were given have worn off.  You may have some cramping. You may be given medicines for this.  You may have bleeding, which varies from light spotting to menstrual-like bleeding. This is normal.  If you had a biopsy done, it is your responsibility to get the results of your procedure. Ask your health care provider, or the department performing the procedure, when your results will be  ready. Summary  Hysteroscopy is a procedure that is used to examine the inside of a woman's womb (uterus).  After the procedure, you may have bleeding, which varies from light spotting to menstrual-like bleeding. This is normal. You may also have cramping.  Plan to have someone take you home from the hospital or clinic. This information is not intended to replace advice given to you by your health care provider. Make sure you discuss any questions you have with your health care provider. Document Revised: 07/21/2017 Document Reviewed: 09/06/2016 Elsevier Patient Education  [employer].

## 2020-04-16 NOTE — Progress Notes (Signed)
[facility] Attending History and Physical  Angela Burton is a 49 y.o. [phone] at [facility] today for preoperative examination for retained intrauterine device and right ovarian cyst.  Discussed procedures including hysteroscopic removal of IUD and diagnostic laparoscopy with right cystectomy.  Pt is aware of risks and benefits of each procedure.  History and medications reviewed. Denies any abnormal vaginal discharge, fevers, chills, sweats, dysuria, nausea, vomiting, other GI or GU symptoms or other general symptoms.  Past Medical History:  Diagnosis Date  . Asthma    albuterol  . Hypertension   . Obesity    Past Surgical History:  Procedure Laterality Date  . DILATION AND CURETTAGE OF UTERUS    . TUBAL LIGATION     OB History  Gravida Para Term Preterm AB Living  3 2 2  0 1 2  SAB TAB Ectopic Multiple Live Births  0 1 0 0 2    # Outcome Date GA Lbr Len/2nd Weight Sex Delivery Anes PTL Lv  3 TAB           2 Term      Vag-Spont     1 Term      Vag-Spont     Patient denies any other pertinent gynecologic issues.  Current Outpatient Medications on File Prior to Visit  Medication Sig Dispense Refill  . ibuprofen (ADVIL) 800 MG tablet Take 1 tablet (800 mg total) by mouth every 8 (eight) hours as needed for moderate pain or cramping. 30 tablet 2  . losartan-hydrochlorothiazide (HYZAAR) 50-12.5 MG tablet Take 1 tablet by mouth daily. 90 tablet 3  . acetaminophen (TYLENOL) 500 MG tablet Take 1,000 mg by mouth every 6 (six) hours as needed for moderate pain or headache. (Patient not taking: Reported on 04/16/2020)    . albuterol (PROVENTIL HFA;VENTOLIN HFA) 108 (90 Base) MCG/ACT inhaler Inhale 1-2 puffs into the lungs every 6 (six) hours as needed for wheezing or shortness of breath. (Patient not taking: Reported on 03/24/2020) 1 Inhaler 2   No current facility-administered medications on file prior to visit.   Allergies  Allergen Reactions  . Sulfa Antibiotics  Anaphylaxis, Hives, Swelling and Rash  . Aspirin Hives  . Erythromycin Other (See Comments)    Had a seizure.     Social History:   reports that she has been smoking cigarettes. She has been smoking about 0.25 packs per day. She has never used smokeless tobacco. She reports current alcohol use of about 2.0 standard drinks of alcohol per week. She reports current drug use. Drug: Marijuana. Family History  Problem Relation Age of Onset  . Heart attack Mother   . Diabetes Father   . Hypertension Father   . High Cholesterol Father   . Breast cancer Sister   . HIV Sister     Review of Systems: Pertinent items noted in HPI and remainder of comprehensive ROS otherwise negative.  PHYSICAL EXAM: Blood pressure 138/90, pulse 76, height 5\' 6"  (1.676 m), weight 246 lb (111.6 kg). CONSTITUTIONAL: Well-developed, well-nourished female in no acute distress.  HENT:  Normocephalic, atraumatic, External right and left ear normal. Oropharynx is clear and moist EYES: Conjunctivae and EOM are normal. Pupils are equal, round, and reactive to light. No scleral icterus.  NECK: Normal range of motion, supple, no masses SKIN: Skin is warm and dry. No rash noted. Not diaphoretic. No erythema. No pallor. NEUROLOGIC: Alert and oriented to person, place, and time. Normal reflexes, muscle tone coordination. No cranial nerve deficit noted.  PSYCHIATRIC: Normal mood and affect. Normal behavior. Normal judgment and thought content. CARDIOVASCULAR: Normal heart rate noted, regular rhythm RESPIRATORY: Effort and breath sounds normal, no problems with respiration noted ABDOMEN: Soft, nontender, nondistended, obese, previous laparoscopic scar noted at umbilicus. MUSCULOSKELETAL: Normal range of motion. No tenderness.  No cyanosis, clubbing, or edema.  2+ distal pulses.  Labs: No results found for this or any previous visit (from the past 336 hour(s)).  Imaging Studies: US PELVIS TRANSVAGINAL NON-OB (TV ONLY)  Result  Date: 03/30/2020 CLINICAL DATA:  Presence of IUD.  Previous tubal ligation. EXAM: ULTRASOUND PELVIS TRANSVAGINAL TECHNIQUE: Transvaginal ultrasound examination of the pelvis was performed including evaluation of the uterus, ovaries, adnexal regions, and pelvic cul-de-sac. COMPARISON:  02/14/2020 as well as CT 02/14/2020 FINDINGS: Uterus Measurements: 10.5 x 9.1 x 9.1 cm = volume: 452 mL. Three intrauterine masses with the largest over the fundus measuring 5.8 cm unchanged compatible with leiomyomas. The other 2 leiomyomas measure 2 cm and 3.9 cm respectively located over the posterior uterine body. IUD is somewhat difficult to visualize due to the adjacent fibroids although appears to be over the endometrium left of midline. Endometrium Difficult visualization due to the adjacent leiomyomas. Thickness: 8.7 mm. No focal abnormality visualized. Right ovary Measurements: 10.5 x 8.2 x 8.8 cm = volume: 395 mL. Stable 8.9 cm simple cyst. Left ovary Measurements: Not visualized. Other findings:  No abnormal free fluid IMPRESSION: 1. Slightly enlarged uterus containing 3 leiomyomas with the largest measuring 5.8 cm over the fundus. Normal endometrium with IUD in adequate position. 2.  8.9 cm simple right ovarian cyst.  Left ovary not visualized. Electronically Signed   By: Marin Olp M.D.   On: 03/30/2020 16:00    Assessment: Right ovarian cyst: Laparoscopy with right ovarian cystectomy.  Risks and benefits of the procedure given including bleeding, infection, involvement of other organs including bladder and bowel.  Small risk of oophorectomy  Retained IUD: Hysteroscopy will be performed.  Risks and benefits discussed including bleeding, infection, involvement of other organs as well as uterine perforation.  Plan: Diagnostic laparoscopy with right ovarian cystectomy, possible laparotomy Operative hysteroscopy with removal of retained IUD   Lynnda Shields, MD, [place], [facility] [place]

## 2020-04-17 NOTE — Addendum Note (Signed)
Addended by: [name] on: 04/17/2020 09:56 AM   Modules accepted: Orders, SmartSet

## 2020-04-22 ENCOUNTER — Ambulatory Visit: Payer: Self-pay | Attending: Critical Care Medicine | Admitting: Critical Care Medicine

## 2020-04-22 ENCOUNTER — Encounter: Payer: Self-pay | Admitting: Critical Care Medicine

## 2020-04-22 ENCOUNTER — Other Ambulatory Visit: Payer: Self-pay

## 2020-04-22 VITALS — BP 132/85 | HR 69 | Temp 96.7°F | Ht 66.0 in | Wt 247.0 lb

## 2020-04-22 DIAGNOSIS — I10 Essential (primary) hypertension: Secondary | ICD-10-CM

## 2020-04-22 DIAGNOSIS — R0683 Snoring: Secondary | ICD-10-CM

## 2020-04-22 DIAGNOSIS — Z720 Tobacco use: Secondary | ICD-10-CM

## 2020-04-22 DIAGNOSIS — E669 Obesity, unspecified: Secondary | ICD-10-CM

## 2020-04-22 DIAGNOSIS — Z6841 Body Mass Index (BMI) 40.0 and over, adult: Secondary | ICD-10-CM | POA: Insufficient documentation

## 2020-04-22 DIAGNOSIS — N83201 Unspecified ovarian cyst, right side: Secondary | ICD-10-CM

## 2020-04-22 DIAGNOSIS — J4520 Mild intermittent asthma, uncomplicated: Secondary | ICD-10-CM

## 2020-04-22 MED ORDER — ALBUTEROL SULFATE HFA 108 (90 BASE) MCG/ACT IN AERS: 1.0000 | INHALATION_SPRAY | Freq: Four times a day (QID) | RESPIRATORY_TRACT | 1 refills | Status: DC | PRN

## 2020-04-22 MED ORDER — NICOTINE POLACRILEX 4 MG MT LOZG: LOZENGE | OROMUCOSAL | 4 refills | Status: DC

## 2020-04-22 MED ORDER — AMLODIPINE BESYLATE 10 MG PO TABS: 10.0000 mg | ORAL_TABLET | Freq: Every day | ORAL | 3 refills | Status: DC

## 2020-04-22 MED ORDER — HYDROCHLOROTHIAZIDE 25 MG PO TABS: 25.0000 mg | ORAL_TABLET | Freq: Every day | ORAL | 3 refills | Status: DC

## 2020-04-22 MED FILL — SM NICOTINE 4 MG LOZENGE: 4 | 36 days supply | Qty: 72 | Fill #0

## 2020-04-22 MED FILL — AMLODIPINE BESYLATE 10 MG T: 10 | 30 days supply | Qty: 30 | Fill #0

## 2020-04-22 MED FILL — HYDROCHLOROTHIAZIDE 25 MG T: 25 | 30 days supply | Qty: 30 | Fill #0

## 2020-04-22 NOTE — Assessment & Plan Note (Signed)
Hypertension not well controlled at this time patient with side effects from losartan HCT  Plan is to begin amlodipine 10 mg daily and hydrochlorothiazide separately 25 mg daily and discontinue the losartan component  Follow-up thyroid panel lipid panel and metabolic panel  Reeducated patient on hypertensive diet on AVS

## 2020-04-22 NOTE — Patient Instructions (Signed)
Quit smoking use nicotine 2 mg twice daily as a lozenge dissolve between your cheek and gums do not chew this  Blood work is done today as a preop  Refills on albuterol given  Discontinue losartan HCT  Begin amlodipine 10 mg daily and hydrochlorthiazide 25 mg daily  Follow healthy diet as outlined below  Follow behavior modification tips as outlined below  You likely do have sleep apnea we will need to work on this later  [place] assistance obtain a Development worker, community appointment bring all your documents and for that  Return Dr. [name] 2 months  Hypertension, Adult Hypertension is another name for high blood pressure. High blood pressure forces your heart to work harder to pump blood. This can cause problems over time. There are two numbers in a blood pressure reading. There is a top number (systolic) over a bottom number (diastolic). It is best to have a blood pressure that is below 120/80. Healthy choices can help lower your blood pressure, or you may need medicine to help lower it. What are the causes? The cause of this condition is not known. Some conditions may be related to high blood pressure. What increases the risk?  Smoking.  Having type 2 diabetes mellitus, high cholesterol, or both.  Not getting enough exercise or physical activity.  Being overweight.  Having too much fat, sugar, calories, or salt (sodium) in your diet.  Drinking too much alcohol.  Having long-term (chronic) kidney disease.  Having a family history of high blood pressure.  Age. Risk increases with age.  Race. You may be at higher risk if you are African American.  Gender. Men are at higher risk than women before age 78. After age 74, women are at higher risk than men.  Having obstructive sleep apnea.  Stress. What are the signs or symptoms?  High blood pressure may not cause symptoms. Very high blood pressure (hypertensive crisis) may cause: ? Headache. ? Feelings of worry  or nervousness (anxiety). ? Shortness of breath. ? Nosebleed. ? A feeling of being sick to your stomach (nausea). ? Throwing up (vomiting). ? Changes in how you see. ? Very bad chest pain. ? Seizures. How is this treated?  This condition is treated by making healthy lifestyle changes, such as: ? Eating healthy foods. ? Exercising more. ? Drinking less alcohol.  Your health care provider may prescribe medicine if lifestyle changes are not enough to get your blood pressure under control, and if: ? Your top number is above 130. ? Your bottom number is above 80.  Your personal target blood pressure may vary. Follow these instructions at home: Eating and drinking   If told, follow the DASH eating plan. To follow this plan: ? Fill one half of your plate at each meal with fruits and vegetables. ? Fill one fourth of your plate at each meal with whole grains. Whole grains include whole-wheat pasta, brown rice, and whole-grain bread. ? Eat or drink low-fat dairy products, such as skim milk or low-fat yogurt. ? Fill one fourth of your plate at each meal with low-fat (lean) proteins. Low-fat proteins include fish, chicken without skin, eggs, beans, and tofu. ? Avoid fatty meat, cured and processed meat, or chicken with skin. ? Avoid pre-made or processed food.  Eat less than 1,500 mg of salt each day.  Do not drink alcohol if: ? Your doctor tells you not to drink. ? You are pregnant, may be pregnant, or are planning to become pregnant.  If  you drink alcohol: ? Limit how much you use to:  0-1 drink a day for women.  0-2 drinks a day for men. ? Be aware of how much alcohol is in your drink. In the [place] one drink equals one 12 oz bottle of beer (355 mL), one 5 oz glass of wine (148 mL), or one 1 oz glass of hard liquor (44 mL). Lifestyle   Work with your doctor to stay at a healthy weight or to lose weight. Ask your doctor what the best weight is for you.  Get at least 30 minutes  of exercise most days of the week. This may include walking, swimming, or biking.  Get at least 30 minutes of exercise that strengthens your muscles (resistance exercise) at least 3 days a week. This may include lifting weights or doing Pilates.  Do not use any products that contain nicotine or tobacco, such as cigarettes, e-cigarettes, and chewing tobacco. If you need help quitting, ask your doctor.  Check your blood pressure at home as told by your doctor.  Keep all follow-up visits as told by your doctor. This is important. Medicines  Take over-the-counter and prescription medicines only as told by your doctor. Follow directions carefully.  Do not skip doses of blood pressure medicine. The medicine does not work as well if you skip doses. Skipping doses also puts you at risk for problems.  Ask your doctor about side effects or reactions to medicines that you should watch for. Contact a doctor if you:  Think you are having a reaction to the medicine you are taking.  Have headaches that keep coming back (recurring).  Feel dizzy.  Have swelling in your ankles.  Have trouble with your vision. Get help right away if you:  Get a very bad headache.  Start to feel mixed up (confused).  Feel weak or numb.  Feel faint.  Have very bad pain in your: ? Chest. ? Belly (abdomen).  Throw up more than once.  Have trouble breathing. Summary  Hypertension is another name for high blood pressure.  High blood pressure forces your heart to work harder to pump blood.  For most people, a normal blood pressure is less than 120/80.  Making healthy choices can help lower blood pressure. If your blood pressure does not get lower with healthy choices, you may need to take medicine. This information is not intended to replace advice given to you by your health care provider. Make sure you discuss any questions you have with your health care provider. Document Revised: 04/18/2018 Document  Reviewed: 04/18/2018 Elsevier Patient Education  [employer].  Tobacco Use Disorder Tobacco use disorder (TUD) occurs when a person craves, seeks, and uses tobacco, regardless of the consequences. This disorder can cause problems with mental and physical health. It can affect your ability to have healthy relationships, and it can keep you from meeting your responsibilities at work, home, or school. Tobacco may be:  Smoked as a cigarette or cigar.  Inhaled using e-cigarettes.  Smoked in a pipe or hookah.  Chewed as smokeless tobacco.  Inhaled into the nostrils as snuff. Tobacco products contain a dangerous chemical called nicotine, which is very addictive. Nicotine triggers hormones that make the body feel stimulated and works on areas of the brain that make you feel good. These effects can make it hard for people to quit nicotine. Tobacco contains many other unsafe chemicals that can damage almost every organ in the body. Smoking tobacco also puts others  in danger due to fire risk and possible health problems caused by breathing in secondhand smoke. What are the signs or symptoms? Symptoms of TUD may include:  Being unable to slow down or stop your tobacco use.  Spending an abnormal amount of time getting or using tobacco.  Craving tobacco. Cravings may last for up to 6 months after quitting.  Tobacco use that: ? Interferes with your work, school, or home life. ? Interferes with your personal and social relationships. ? Makes you give up activities that you once enjoyed or found important.  Using tobacco even though you know that it is: ? Dangerous or bad for your health or someone else's health. ? Causing problems in your life.  Needing more and more of the substance to get the same effect (developing tolerance).  Experiencing unpleasant symptoms if you do not use the substance (withdrawal). Withdrawal symptoms may include: ? Depressed, anxious, or irritable  mood. ? Difficulty concentrating. ? Increased appetite. ? Restlessness or trouble sleeping.  Using the substance to avoid withdrawal. How is this diagnosed? This condition may be diagnosed based on:  Your current and past tobacco use. Your health care provider may ask questions about how your tobacco use affects your life.  A physical exam. You may be diagnosed with TUD if you have at least two symptoms within a 46-month period. How is this treated? This condition is treated by stopping tobacco use. Many people are unable to quit on their own and need help. Treatment may include:  Nicotine replacement therapy (NRT). NRT provides nicotine without the other harmful chemicals in tobacco. NRT gradually lowers the dosage of nicotine in the body and reduces withdrawal symptoms. NRT is available as: ? Over-the-counter gums, lozenges, and skin patches. ? Prescription mouth inhalers and nasal sprays.  Medicine that acts on the brain to reduce cravings and withdrawal symptoms.  A type of talk therapy that examines your triggers for tobacco use, how to avoid them, and how to cope with cravings (behavioral therapy).  Hypnosis. This may help with withdrawal symptoms.  Joining a support group for others coping with TUD. The best treatment for TUD is usually a combination of medicine, talk therapy, and support groups. Recovery can be a long process. Many people start using tobacco again after stopping (relapse). If you relapse, it does not mean that treatment will not work. Follow these instructions at home:  Lifestyle  Do not use any products that contain nicotine or tobacco, such as cigarettes and e-cigarettes.  Avoid things that trigger tobacco use as much as you can. Triggers include people and situations that usually cause you to use tobacco.  Avoid drinks that contain caffeine, including coffee. These may worsen some withdrawal symptoms.  Find ways to manage stress. Wanting to smoke may  cause stress, and stress can make you want to smoke. Relaxation techniques such as deep breathing, meditation, and yoga may help.  Attend support groups as needed. These groups are an important part of long-term recovery for many people. General instructions  Take over-the-counter and prescription medicines only as told by your health care provider.  Check with your health care provider before taking any new prescription or over-the-counter medicines.  Decide on a friend, family member, or smoking quit-line (such as 1-800-QUIT-NOW in the [place] that you can call or text when you feel the urge to smoke or when you need help coping with cravings.  Keep all follow-up visits as told by your health care provider and therapist. This  is important. Contact a health care provider if:  You are not able to take your medicines as prescribed.  Your symptoms get worse, even with treatment. Summary  Tobacco use disorder (TUD) occurs when a person craves, seeks, and uses tobacco regardless of the consequences.  This condition may be diagnosed based on your current and past tobacco use and a physical exam.  Many people are unable to quit on their own and need help. Recovery can be a long process.  The most effective treatment for TUD is usually a combination of medicine, talk therapy, and support groups. This information is not intended to replace advice given to you by your health care provider. Make sure you discuss any questions you have with your health care provider. Document Revised: 07/26/2017 Document Reviewed: 07/26/2017 Elsevier Patient Education  2020 [employer].

## 2020-04-22 NOTE — Assessment & Plan Note (Signed)
  .   Current smoking consumption amount: 3 cigarettes a day  . Dicsussion on advise to quit smoking and smoking impacts: Cardiovascular impacts  . Patient's willingness to quit: Willing to quit  . Methods to quit smoking discussed: Behavioral modification medications  . Medication management of smoking session drugs discussed: Nicotine replacement  . Resources provided:  AVS   . Setting quit date 1 month  . Follow-up arranged 2 months   Time spent counseling the patient: 5 minutes

## 2020-04-22 NOTE — Progress Notes (Signed)
HTN f /u   Medical clearance DOS 05/05/2020

## 2020-04-22 NOTE — Assessment & Plan Note (Signed)
Obesity with BMI of 39.8  Diet and exercise plan was given to the patient

## 2020-04-22 NOTE — Assessment & Plan Note (Signed)
Likely has obstructive sleep apnea will need to wait on evaluation at a later date

## 2020-04-22 NOTE — Progress Notes (Signed)
Subjective:    Patient ID: Angela Burton, female    DOB: 09-29-1970, 49 y.o.   MRN: [id number]  03/24/20 [facility]  this is a 49 year old female who comes to the mobile [facility] clinic today without a primary care provider wishing to have her blood pressure evaluated  The patient was earlier at gynecology and evaluated for ovarian cyst and uterine fibroids and has an ultrasound pending for evaluation of this and also for intrauterine device that is in place  The patient currently smokes 3 cigarettes daily.  The patient is currently not on any antihypertensive medications. There is a past history of asthma but she does not have a current albuterol inhaler.  She was given ibuprofen 800 mg today at the gynecology office for her abdominal pain which is related to the cyst in the ovary and fibroids  The patient denies chest pain shortness of breath headaches or dizziness.  04/22/2020 This patient returns to establish in the health center after being seen in the mobile clinic previously early August.  Patient has history of IUD in place with ovarian cyst and uterine fibroids and is planning surgery per gynecology upcoming.  She still smoking 3 cigarettes a day.  She has quite a bit of stress and anxiety with this.  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  She notes slight increased wheezing with the weather does have history of asthma and is needing refills on her albuterol inhaler.  She does note snoring and hypersomnolence.  She is never been studied for sleep apnea.  She does not have any insurance at this time making a sleep evaluation and treatment difficult     Past Medical History:  Diagnosis Date  . Asthma    albuterol  . Hypertension   . Obesity      Family History  Problem Relation Age of Onset  . Heart attack Mother   . Diabetes Father   . Hypertension Father   . High Cholesterol Father   . Breast cancer Sister    . HIV Sister      Social History   Socioeconomic History  . Marital status: Legally Separated    Spouse name: Not on file  . Number of children: Not on file  . Years of education: Not on file  . Highest education level: Not on file  Occupational History  . Not on file  Tobacco Use  . Smoking status: Current Every Day Smoker    Packs/day: 0.15    Types: Cigarettes  . Smokeless tobacco: Never Used  Substance and Sexual Activity  . Alcohol use: Yes    Alcohol/week: 2.0 standard drinks    Types: 2 Glasses of wine per week    Comment: rarely  . Drug use: Yes    Types: Marijuana    Comment: last used 08/15/18  . Sexual activity: Yes    Birth control/protection: I.U.D., Surgical  Other Topics Concern  . Not on file  Social History Narrative  . Not on file   Social Determinants of Health   Financial Resource Strain:   . Difficulty of Paying Living Expenses: Not on file  Food Insecurity: Food Insecurity Present  . Worried About Charity fundraiser in the Last Year: Sometimes true  . Ran Out of Food in the Last Year: Sometimes true  Transportation Needs: Unmet Transportation Needs  . Lack of Transportation (Medical): Yes  . Lack of  Transportation (Non-Medical): Yes  Physical Activity:   . Days of Exercise per Week: Not on file  . Minutes of Exercise per Session: Not on file  Stress:   . Feeling of Stress : Not on file  Social Connections:   . Frequency of Communication with Friends and Family: Not on file  . Frequency of Social Gatherings with Friends and Family: Not on file  . Attends Religious Services: Not on file  . Active Member of Clubs or Organizations: Not on file  . Attends Archivist Meetings: Not on file  . Marital Status: Not on file  Intimate Partner Violence:   . Fear of Current or Ex-Partner: Not on file  . Emotionally Abused: Not on file  . Physically Abused: Not on file  . Sexually Abused: Not on file     Allergies  Allergen Reactions   . Sulfa Antibiotics Anaphylaxis, Hives, Swelling and Rash  . Aspirin Hives  . Erythromycin Other (See Comments)    Had a seizure.      Outpatient Medications Prior to Visit  Medication Sig Dispense Refill  . losartan-hydrochlorothiazide (HYZAAR) 50-12.5 MG tablet Take 1 tablet by mouth daily. 90 tablet 3  . acetaminophen (TYLENOL) 500 MG tablet Take 1,000 mg by mouth every 6 (six) hours as needed for moderate pain or headache.  (Patient not taking: Reported on 04/22/2020)    . ibuprofen (ADVIL) 800 MG tablet Take 1 tablet (800 mg total) by mouth every 8 (eight) hours as needed for moderate pain or cramping. (Patient not taking: Reported on 04/22/2020) 30 tablet 2  . albuterol (PROVENTIL HFA;VENTOLIN HFA) 108 (90 Base) MCG/ACT inhaler Inhale 1-2 puffs into the lungs every 6 (six) hours as needed for wheezing or shortness of breath. (Patient not taking: Reported on 04/22/2020) 1 Inhaler 2   No facility-administered medications prior to visit.    Review of Systems  Constitutional: Positive for unexpected weight change.  HENT: Negative.   Eyes: Positive for visual disturbance.  Respiratory: Negative for cough, shortness of breath, wheezing and stridor.        Snores at night  Cardiovascular: Negative for chest pain.  Gastrointestinal: Positive for abdominal pain. Negative for nausea and vomiting.  Endocrine: Negative.   Genitourinary: Negative.   Neurological: Positive for dizziness and headaches.  Hematological: Negative.   Psychiatric/Behavioral: Negative for self-injury and suicidal ideas.       Objective:   Physical Exam Vitals:   04/22/20 1019  Pulse: 69  Temp: (!) 96.7 F (35.9 C)  SpO2: 100%  Weight: 247 lb (112 kg)  Height: 5\' 6"  (1.676 m)    Gen: Pleasant, obese, in no distress,  normal affect  ENT: No lesions,  mouth clear,  oropharynx clear, no postnasal drip  Neck: No JVD, no TMG, no carotid bruits  Lungs: No use of accessory muscles, no dullness to percussion,  clear without rales or rhonchi  Cardiovascular: RRR, heart sounds normal, no murmur or gallops, no peripheral edema  Abdomen: soft and NT, no HSM,  BS normal  Musculoskeletal: No deformities, no cyanosis or clubbing  Neuro: alert, non focal  Skin: Warm, no lesions or rashes       Assessment & Plan:  I personally reviewed all images and lab data in the [facility] system as well as any outside material available during this office visit and agree with the  radiology impressions.   Hypertension Hypertension not well controlled at this time patient with side effects from losartan HCT  Plan  is to begin amlodipine 10 mg daily and hydrochlorothiazide separately 25 mg daily and discontinue the losartan component  Follow-up thyroid panel lipid panel and metabolic panel  Reeducated patient on hypertensive diet on AVS  Asthma Mild intermittent asthma we will give refill on albuterol  Ovarian cyst Ovarian cyst fibroid uterus and IUD in place  Management per gynecology  Patient is medically cleared for planned surgery  Obesity (BMI 35.0-39.9 without comorbidity) Obesity with BMI of 39.8  Diet and exercise plan was given to the patient  Snoring Likely has obstructive sleep apnea will need to wait on evaluation at a later date  Tobacco use    . Current smoking consumption amount: 3 cigarettes a day  . Dicsussion on advise to quit smoking and smoking impacts: Cardiovascular impacts  . Patient's willingness to quit: Willing to quit  . Methods to quit smoking discussed: Behavioral modification medications  . Medication management of smoking session drugs discussed: Nicotine replacement  . Resources provided:  AVS   . Setting quit date 1 month  . Follow-up arranged 2 months   Time spent counseling the patient: 5 minutes    Diagnoses and all orders for this visit:  Essential hypertension -     CBC with Differential/Platelet -     Comprehensive metabolic panel -      Thyroid Panel With TSH -     Lipid panel  Mild intermittent asthma without complication -     CBC with Differential/Platelet  Cyst of right ovary  Obesity (BMI 35.0-39.9 without comorbidity)  Snoring  Tobacco use  Other orders -     hydrochlorothiazide (HYDRODIURIL) 25 MG tablet; Take 1 tablet (25 mg total) by mouth daily. -     amLODipine (NORVASC) 10 MG tablet; Take 1 tablet (10 mg total) by mouth daily. -     albuterol (VENTOLIN HFA) 108 (90 Base) MCG/ACT inhaler; Inhale 1-2 puffs into the lungs every 6 (six) hours as needed for wheezing or shortness of breath. -     nicotine polacrilex (NICORETTE MINI) 4 MG lozenge; Take one two times a day as needed to quit smoking

## 2020-04-22 NOTE — Assessment & Plan Note (Signed)
Mild intermittent asthma we will give refill on albuterol

## 2020-04-22 NOTE — Assessment & Plan Note (Signed)
Ovarian cyst fibroid uterus and IUD in place  Management per gynecology  Patient is medically cleared for planned surgery

## 2020-04-23 LAB — CBC WITH DIFFERENTIAL/PLATELET
Basophils Absolute: 0.1 10*3/uL (ref 0.0–0.2)
Basos: 1 %
EOS (ABSOLUTE): 0.1 10*3/uL (ref 0.0–0.4)
Eos: 2 %
Hematocrit: 46.2 % (ref 34.0–46.6)
Hemoglobin: 15.5 g/dL (ref 11.1–15.9)
Immature Grans (Abs): 0 10*3/uL (ref 0.0–0.1)
Immature Granulocytes: 0 %
Lymphocytes Absolute: 2.9 10*3/uL (ref 0.7–3.1)
Lymphs: 45 %
MCH: 29.9 pg (ref 26.6–33.0)
MCHC: 33.5 g/dL (ref 31.5–35.7)
MCV: 89 fL (ref 79–97)
Monocytes Absolute: 0.4 10*3/uL (ref 0.1–0.9)
Monocytes: 6 %
Neutrophils Absolute: 3 10*3/uL (ref 1.4–7.0)
Neutrophils: 46 %
Platelets: 215 10*3/uL (ref 150–450)
RBC: 5.18 x10E6/uL (ref 3.77–5.28)
RDW: 12.4 % (ref 11.7–15.4)
WBC: 6.5 10*3/uL (ref 3.4–10.8)

## 2020-04-23 LAB — COMPREHENSIVE METABOLIC PANEL
ALT: 17 IU/L (ref 0–32)
AST: 14 IU/L (ref 0–40)
Albumin/Globulin Ratio: 1.5 (ref 1.2–2.2)
Albumin: 4 g/dL (ref 3.8–4.8)
Alkaline Phosphatase: 91 IU/L (ref 48–121)
BUN/Creatinine Ratio: 11 (ref 9–23)
BUN: 12 mg/dL (ref 6–24)
Bilirubin Total: 0.8 mg/dL (ref 0.0–1.2)
CO2: 27 mmol/L (ref 20–29)
Calcium: 9.5 mg/dL (ref 8.7–10.2)
Chloride: 100 mmol/L (ref 96–106)
Creatinine, Ser: 1.11 mg/dL — ABNORMAL HIGH (ref 0.57–1.00)
GFR calc Af Amer: 68 mL/min/{1.73_m2} (ref >59)
GFR calc non Af Amer: 59 mL/min/{1.73_m2} — ABNORMAL LOW (ref >59)
Globulin, Total: 2.7 g/dL (ref 1.5–4.5)
Glucose: 104 mg/dL — ABNORMAL HIGH (ref 65–99)
Potassium: 4 mmol/L (ref 3.5–5.2)
Sodium: 140 mmol/L (ref 134–144)
Total Protein: 6.7 g/dL (ref 6.0–8.5)

## 2020-04-23 LAB — THYROID PANEL WITH TSH
Free Thyroxine Index: 1.5 (ref 1.2–4.9)
T3 Uptake Ratio: 24 % (ref 24–39)
T4, Total: 6.1 ug/dL (ref 4.5–12.0)
TSH: 1.01 u[IU]/mL (ref 0.450–4.500)

## 2020-04-23 LAB — LIPID PANEL
Chol/HDL Ratio: 4 ratio (ref 0.0–4.4)
Cholesterol, Total: 202 mg/dL — ABNORMAL HIGH (ref 100–199)
HDL: 51 mg/dL (ref >39)
LDL Chol Calc (NIH): 128 mg/dL — ABNORMAL HIGH (ref 0–99)
Triglycerides: 128 mg/dL (ref 0–149)
VLDL Cholesterol Cal: 23 mg/dL (ref 5–40)

## 2020-04-28 ENCOUNTER — Encounter (HOSPITAL_BASED_OUTPATIENT_CLINIC_OR_DEPARTMENT_OTHER): Payer: Self-pay | Admitting: Obstetrics and Gynecology

## 2020-05-01 ENCOUNTER — Other Ambulatory Visit: Payer: Self-pay

## 2020-05-01 ENCOUNTER — Encounter (HOSPITAL_BASED_OUTPATIENT_CLINIC_OR_DEPARTMENT_OTHER)
Admission: RE | Admit: 2020-05-01 | Discharge: 2020-05-01 | Disposition: A | Discharge status: Home / Self Care | Payer: Self-pay | Source: Ambulatory Visit | Attending: Obstetrics and Gynecology | Admitting: Obstetrics and Gynecology

## 2020-05-01 ENCOUNTER — Other Ambulatory Visit (HOSPITAL_COMMUNITY)
Admission: RE | Admit: 2020-05-01 | Discharge: 2020-05-01 | Disposition: A | Discharge status: Home / Self Care | Payer: HRSA Program | Source: Ambulatory Visit | Attending: Obstetrics and Gynecology | Admitting: Obstetrics and Gynecology

## 2020-05-01 DIAGNOSIS — Z20822 Contact with and (suspected) exposure to covid-19: Secondary | ICD-10-CM | POA: Insufficient documentation

## 2020-05-01 DIAGNOSIS — Z01812 Encounter for preprocedural laboratory examination: Secondary | ICD-10-CM | POA: Insufficient documentation

## 2020-05-01 LAB — BASIC METABOLIC PANEL
Anion gap: 9 (ref 5–15)
BUN: 10 mg/dL (ref 6–20)
CO2: 27 mmol/L (ref 22–32)
Calcium: 9.1 mg/dL (ref 8.9–10.3)
Chloride: 104 mmol/L (ref 98–111)
Creatinine, Ser: 0.84 mg/dL (ref 0.44–1.00)
GFR calc Af Amer: >60 mL/min (ref >60)
GFR calc non Af Amer: >60 mL/min (ref >60)
Glucose, Bld: 106 mg/dL — ABNORMAL HIGH (ref 70–99)
Potassium: 4.4 mmol/L (ref 3.5–5.1)
Sodium: 140 mmol/L (ref 135–145)

## 2020-05-01 LAB — CBC
HCT: 47.9 % — ABNORMAL HIGH (ref 36.0–46.0)
Hemoglobin: 15.5 g/dL — ABNORMAL HIGH (ref 12.0–15.0)
MCH: 29.5 pg (ref 26.0–34.0)
MCHC: 32.4 g/dL (ref 30.0–36.0)
MCV: 91.1 fL (ref 80.0–100.0)
Platelets: 248 10*3/uL (ref 150–400)
RBC: 5.26 MIL/uL — ABNORMAL HIGH (ref 3.87–5.11)
RDW: 12.9 % (ref 11.5–15.5)
WBC: 6.1 10*3/uL (ref 4.0–10.5)
nRBC: 0 % (ref 0.0–0.2)

## 2020-05-01 LAB — POCT PREGNANCY, URINE: Preg Test, Ur: NEGATIVE

## 2020-05-01 LAB — SARS CORONAVIRUS 2 (TAT 6-24 HRS): SARS Coronavirus 2: NEGATIVE

## 2020-05-01 LAB — TYPE AND SCREEN
ABO/RH(D): A NEG
Antibody Screen: NEGATIVE

## 2020-05-01 NOTE — Progress Notes (Signed)

## 2020-05-04 ENCOUNTER — Telehealth: Payer: Self-pay | Admitting: Critical Care Medicine

## 2020-05-04 NOTE — Telephone Encounter (Signed)
Copied from [place] [id number]. Topic: Quick Communication - See Telephone Encounter >> May 04, 2020 12:31 PM Loma [place] wrote: CRM for notification. See Telephone encounter for: 05/04/20. Pls have Angela Burton cb pt concering financial assist. At [phone]

## 2020-05-04 NOTE — Telephone Encounter (Signed)
Pt was call to schedule a financial appt

## 2020-05-05 ENCOUNTER — Ambulatory Visit (HOSPITAL_BASED_OUTPATIENT_CLINIC_OR_DEPARTMENT_OTHER): Payer: Self-pay | Admitting: Certified Registered"

## 2020-05-05 ENCOUNTER — Ambulatory Visit (HOSPITAL_COMMUNITY)
Admission: RE | Admit: 2020-05-05 | Discharge: 2020-05-05 | Disposition: A | Discharge status: Home / Self Care | Payer: Self-pay | Attending: Obstetrics and Gynecology | Admitting: Obstetrics and Gynecology

## 2020-05-05 ENCOUNTER — Other Ambulatory Visit: Payer: Self-pay

## 2020-05-05 ENCOUNTER — Encounter (HOSPITAL_BASED_OUTPATIENT_CLINIC_OR_DEPARTMENT_OTHER)
Admission: RE | Disposition: A | Discharge status: Home / Self Care | Payer: Self-pay | Source: Home / Self Care | Attending: Obstetrics and Gynecology

## 2020-05-05 ENCOUNTER — Encounter (HOSPITAL_BASED_OUTPATIENT_CLINIC_OR_DEPARTMENT_OTHER): Payer: Self-pay | Admitting: Obstetrics and Gynecology

## 2020-05-05 DIAGNOSIS — Z79899 Other long term (current) drug therapy: Secondary | ICD-10-CM | POA: Insufficient documentation

## 2020-05-05 DIAGNOSIS — F17210 Nicotine dependence, cigarettes, uncomplicated: Secondary | ICD-10-CM | POA: Insufficient documentation

## 2020-05-05 DIAGNOSIS — T8339XA Other mechanical complication of intrauterine contraceptive device, initial encounter: Secondary | ICD-10-CM

## 2020-05-05 DIAGNOSIS — Z881 Allergy status to other antibiotic agents status: Secondary | ICD-10-CM | POA: Insufficient documentation

## 2020-05-05 DIAGNOSIS — N83291 Other ovarian cyst, right side: Secondary | ICD-10-CM

## 2020-05-05 DIAGNOSIS — J45909 Unspecified asthma, uncomplicated: Secondary | ICD-10-CM | POA: Insufficient documentation

## 2020-05-05 DIAGNOSIS — D270 Benign neoplasm of right ovary: Secondary | ICD-10-CM | POA: Insufficient documentation

## 2020-05-05 DIAGNOSIS — Z6841 Body Mass Index (BMI) 40.0 and over, adult: Secondary | ICD-10-CM | POA: Insufficient documentation

## 2020-05-05 DIAGNOSIS — I10 Essential (primary) hypertension: Secondary | ICD-10-CM | POA: Insufficient documentation

## 2020-05-05 DIAGNOSIS — Z8249 Family history of ischemic heart disease and other diseases of the circulatory system: Secondary | ICD-10-CM | POA: Insufficient documentation

## 2020-05-05 DIAGNOSIS — Z882 Allergy status to sulfonamides status: Secondary | ICD-10-CM | POA: Insufficient documentation

## 2020-05-05 DIAGNOSIS — N83201 Unspecified ovarian cyst, right side: Secondary | ICD-10-CM

## 2020-05-05 DIAGNOSIS — T8332XD Displacement of intrauterine contraceptive device, subsequent encounter: Secondary | ICD-10-CM

## 2020-05-05 DIAGNOSIS — E669 Obesity, unspecified: Secondary | ICD-10-CM | POA: Insufficient documentation

## 2020-05-05 DIAGNOSIS — Z9851 Tubal ligation status: Secondary | ICD-10-CM | POA: Insufficient documentation

## 2020-05-05 DIAGNOSIS — Z803 Family history of malignant neoplasm of breast: Secondary | ICD-10-CM | POA: Insufficient documentation

## 2020-05-05 DIAGNOSIS — T8332XA Displacement of intrauterine contraceptive device, initial encounter: Secondary | ICD-10-CM | POA: Insufficient documentation

## 2020-05-05 DIAGNOSIS — Z886 Allergy status to analgesic agent status: Secondary | ICD-10-CM | POA: Insufficient documentation

## 2020-05-05 HISTORY — PX: HYSTEROSCOPY WITH D & C: SHX1775

## 2020-05-05 HISTORY — PX: LAPAROSCOPIC OVARIAN CYSTECTOMY: SHX6248

## 2020-05-05 LAB — ABO/RH: ABO/RH(D): A NEG

## 2020-05-05 LAB — POCT PREGNANCY, URINE
Preg Test, Ur: NEGATIVE
Preg Test, Ur: NEGATIVE

## 2020-05-05 SURGERY — EXCISION, CYST, OVARY, LAPAROSCOPIC
Anesthesia: General | Site: Uterus | Laterality: Right

## 2020-05-05 MED ORDER — OXYCODONE HCL 5 MG PO TABS
5.0000 mg | ORAL_TABLET | Freq: Once | ORAL | Status: AC | PRN
  Administered 2020-05-05: 5 mg via ORAL

## 2020-05-05 MED ORDER — ACETAMINOPHEN 500 MG PO TABS
1000.0000 mg | ORAL_TABLET | Freq: Once | ORAL | Status: AC
  Administered 2020-05-05: 1000 mg via ORAL

## 2020-05-05 MED ORDER — HYDROMORPHONE HCL 1 MG/ML IJ SOLN
INTRAMUSCULAR | Status: AC
  Filled 2020-05-05: qty 0.5

## 2020-05-05 MED ORDER — SODIUM CHLORIDE 0.9 % IV SOLN
INTRAVENOUS | Status: AC | PRN
  Administered 2020-05-05: 500 mL via INTRAMUSCULAR

## 2020-05-05 MED ORDER — SODIUM CHLORIDE 0.9 % IR SOLN
Status: DC | PRN
  Administered 2020-05-05: 3000 mL

## 2020-05-05 MED ORDER — AMISULPRIDE (ANTIEMETIC) 5 MG/2ML IV SOLN: 10.0000 mg | Freq: Once | INTRAVENOUS | Status: DC | PRN

## 2020-05-05 MED ORDER — LIDOCAINE HCL (PF) 1 % IJ SOLN
INTRAMUSCULAR | Status: AC
  Filled 2020-05-05: qty 30

## 2020-05-05 MED ORDER — KETOROLAC TROMETHAMINE 30 MG/ML IJ SOLN
INTRAMUSCULAR | Status: AC
  Filled 2020-05-05: qty 1

## 2020-05-05 MED ORDER — LIDOCAINE HCL (CARDIAC) PF 100 MG/5ML IV SOSY
PREFILLED_SYRINGE | INTRAVENOUS | Status: DC | PRN
  Administered 2020-05-05: 100 mg via INTRAVENOUS

## 2020-05-05 MED ORDER — HYDROMORPHONE HCL 1 MG/ML IJ SOLN
0.2500 mg | INTRAMUSCULAR | Status: DC | PRN
  Administered 2020-05-05 (×4): 0.5 mg via INTRAVENOUS

## 2020-05-05 MED ORDER — POVIDONE-IODINE 10 % EX SWAB: 2.0000 "application " | Freq: Once | CUTANEOUS | Status: DC

## 2020-05-05 MED ORDER — PROPOFOL 10 MG/ML IV BOLUS
INTRAVENOUS | Status: DC | PRN
  Administered 2020-05-05: 200 mg via INTRAVENOUS

## 2020-05-05 MED ORDER — ROCURONIUM BROMIDE 100 MG/10ML IV SOLN
INTRAVENOUS | Status: DC | PRN
  Administered 2020-05-05: 100 mg via INTRAVENOUS

## 2020-05-05 MED ORDER — ACETAMINOPHEN 10 MG/ML IV SOLN
INTRAVENOUS | Status: AC
  Filled 2020-05-05: qty 100

## 2020-05-05 MED ORDER — SUFENTANIL CITRATE 50 MCG/ML IV SOLN
INTRAVENOUS | Status: AC
  Filled 2020-05-05: qty 1

## 2020-05-05 MED ORDER — KETOROLAC TROMETHAMINE 30 MG/ML IJ SOLN
30.0000 mg | Freq: Once | INTRAMUSCULAR | Status: AC
  Administered 2020-05-05: 30 mg via INTRAVENOUS

## 2020-05-05 MED ORDER — OXYCODONE-ACETAMINOPHEN 5-325 MG PO TABS: 1.0000 | ORAL_TABLET | ORAL | 0 refills | Status: AC | PRN

## 2020-05-05 MED ORDER — CEFAZOLIN SODIUM-DEXTROSE 2-4 GM/100ML-% IV SOLN
2.0000 g | INTRAVENOUS | Status: AC
  Administered 2020-05-05: 2 g via INTRAVENOUS

## 2020-05-05 MED ORDER — PROMETHAZINE HCL 25 MG/ML IJ SOLN: 6.2500 mg | INTRAMUSCULAR | Status: DC | PRN

## 2020-05-05 MED ORDER — OXYCODONE HCL 5 MG/5ML PO SOLN: 5.0000 mg | Freq: Once | ORAL | Status: AC | PRN

## 2020-05-05 MED ORDER — SUGAMMADEX SODIUM 500 MG/5ML IV SOLN
INTRAVENOUS | Status: DC | PRN
  Administered 2020-05-05: 350 mg via INTRAVENOUS

## 2020-05-05 MED ORDER — SUFENTANIL CITRATE 50 MCG/ML IV SOLN
INTRAVENOUS | Status: DC | PRN
  Administered 2020-05-05: 30 ug via INTRAVENOUS
  Administered 2020-05-05: 20 ug via INTRAVENOUS

## 2020-05-05 MED ORDER — OXYCODONE HCL 5 MG PO TABS
ORAL_TABLET | ORAL | Status: AC
  Filled 2020-05-05: qty 1

## 2020-05-05 MED ORDER — CEFAZOLIN SODIUM-DEXTROSE 2-4 GM/100ML-% IV SOLN
INTRAVENOUS | Status: AC
  Filled 2020-05-05: qty 100

## 2020-05-05 MED ORDER — FERRIC SUBSULFATE 259 MG/GM EX SOLN
CUTANEOUS | Status: AC
  Filled 2020-05-05: qty 8

## 2020-05-05 MED ORDER — LACTATED RINGERS IV SOLN: INTRAVENOUS | Status: DC

## 2020-05-05 MED ORDER — BUPIVACAINE HCL 0.25 % IJ SOLN
INTRAMUSCULAR | Status: DC | PRN
  Administered 2020-05-05: 16 mL
  Administered 2020-05-05: 9 mL

## 2020-05-05 MED ORDER — IBUPROFEN 600 MG PO TABS: 600.0000 mg | ORAL_TABLET | Freq: Four times a day (QID) | ORAL | 2 refills | Status: DC | PRN

## 2020-05-05 MED ORDER — MIDAZOLAM HCL 2 MG/2ML IJ SOLN
INTRAMUSCULAR | Status: AC
  Filled 2020-05-05: qty 2

## 2020-05-05 MED ORDER — ACETAMINOPHEN 500 MG PO TABS
ORAL_TABLET | ORAL | Status: AC
  Filled 2020-05-05: qty 2

## 2020-05-05 MED ORDER — DEXAMETHASONE SODIUM PHOSPHATE 4 MG/ML IJ SOLN
INTRAMUSCULAR | Status: DC | PRN
  Administered 2020-05-05: 10 mg via INTRAVENOUS

## 2020-05-05 MED ORDER — LABETALOL HCL 5 MG/ML IV SOLN
INTRAVENOUS | Status: DC | PRN
  Administered 2020-05-05: 10 mg via INTRAVENOUS

## 2020-05-05 MED ORDER — MIDAZOLAM HCL 5 MG/5ML IJ SOLN
INTRAMUSCULAR | Status: DC | PRN
  Administered 2020-05-05: 2 mg via INTRAVENOUS

## 2020-05-05 MED ORDER — FERRIC SUBSULFATE SOLN
Status: DC | PRN
  Administered 2020-05-05: 1 via TOPICAL

## 2020-05-05 MED ORDER — MEPERIDINE HCL 25 MG/ML IJ SOLN: 6.2500 mg | INTRAMUSCULAR | Status: DC | PRN

## 2020-05-05 MED ORDER — DIPHENHYDRAMINE HCL 50 MG/ML IJ SOLN
INTRAMUSCULAR | Status: DC | PRN
  Administered 2020-05-05: 12.5 mg via INTRAVENOUS

## 2020-05-05 SURGICAL SUPPLY — 47 items
ADH SKN CLS APL DERMABOND .7 (GAUZE/BANDAGES/DRESSINGS) ×2
APL SRG 38 LTWT LNG FL B (MISCELLANEOUS) ×2
APPLICATOR ARISTA FLEXITIP XL (MISCELLANEOUS) ×1 IMPLANT
BAG SPEC RTRVL LRG 6X4 10 (ENDOMECHANICALS)
BARRIER ADHS 3X4 INTERCEED (GAUZE/BANDAGES/DRESSINGS) ×1 IMPLANT
BIPOLAR CUTTING LOOP 21FR (ELECTRODE)
BRR ADH 4X3 ABS CNTRL BYND (GAUZE/BANDAGES/DRESSINGS) ×2
CABLE HIGH FREQUENCY MONO STRZ (ELECTRODE) ×1 IMPLANT
CATH ROBINSON RED A/P 16FR (CATHETERS) ×2 IMPLANT
DERMABOND ADVANCED (GAUZE/BANDAGES/DRESSINGS) ×1
DERMABOND ADVANCED .7 DNX12 (GAUZE/BANDAGES/DRESSINGS) ×2 IMPLANT
DRSG OPSITE POSTOP 3X4 (GAUZE/BANDAGES/DRESSINGS) ×3 IMPLANT
DURAPREP 26ML APPLICATOR (WOUND CARE) ×3 IMPLANT
ELECT REM PT RETURN 9FT ADLT (ELECTROSURGICAL) ×3
ELECTRODE REM PT RTRN 9FT ADLT (ELECTROSURGICAL) ×2 IMPLANT
GLOVE BIO SURGEON STRL SZ8 (GLOVE) ×4 IMPLANT
GLOVE BIOGEL M 6.5 STRL (GLOVE) ×1 IMPLANT
GLOVE BIOGEL PI IND STRL 7.0 (GLOVE) ×4 IMPLANT
GLOVE BIOGEL PI IND STRL 8 (GLOVE) ×2 IMPLANT
GLOVE BIOGEL PI INDICATOR 7.0 (GLOVE) ×4
GLOVE BIOGEL PI INDICATOR 8 (GLOVE) ×1
GOWN STRL REUS W/ TWL LRG LVL3 (GOWN DISPOSABLE) ×4 IMPLANT
GOWN STRL REUS W/ TWL XL LVL3 (GOWN DISPOSABLE) ×2 IMPLANT
GOWN STRL REUS W/TWL LRG LVL3 (GOWN DISPOSABLE) ×6
GOWN STRL REUS W/TWL XL LVL3 (GOWN DISPOSABLE) ×3
HEMOSTAT ARISTA ABSORB 3G PWDR (HEMOSTASIS) ×1 IMPLANT
KIT PROCEDURE FLUENT (KITS) ×3 IMPLANT
KIT TURNOVER KIT B (KITS) ×3 IMPLANT
LIGASURE VESSEL 5MM BLUNT TIP (ELECTROSURGICAL) ×3 IMPLANT
LOOP CUTTING BIPOLAR 21FR (ELECTRODE) IMPLANT
NS IRRIG 1000ML POUR BTL (IV SOLUTION) ×3 IMPLANT
PACK LAPAROSCOPY BASIN (CUSTOM PROCEDURE TRAY) ×3 IMPLANT
PACK TRENDGUARD 450 HYBRID PRO (MISCELLANEOUS) ×2 IMPLANT
PACK VAGINAL MINOR WOMEN LF (CUSTOM PROCEDURE TRAY) ×3 IMPLANT
PAD OB MATERNITY 4.3X12.25 (PERSONAL CARE ITEMS) ×3 IMPLANT
POUCH SPECIMEN RETRIEVAL 10MM (ENDOMECHANICALS) IMPLANT
SET IRRIG TUBING LAPAROSCOPIC (IRRIGATION / IRRIGATOR) ×1 IMPLANT
SET TUBE SMOKE EVAC HIGH FLOW (TUBING) ×3 IMPLANT
SLEEVE XCEL OPT CAN 5 100 (ENDOMECHANICALS) ×3 IMPLANT
SUT MNCRL AB 4-0 PS2 18 (SUTURE) ×3 IMPLANT
SUT VICRYL 0 UR6 27IN ABS (SUTURE) ×3 IMPLANT
TOWEL GREEN STERILE FF (TOWEL DISPOSABLE) ×6 IMPLANT
TRAY FOLEY W/BAG SLVR 14FR (SET/KITS/TRAYS/PACK) ×3 IMPLANT
TRENDGUARD 450 HYBRID PRO PACK (MISCELLANEOUS) ×3
TROCAR BALLN 12MMX100 BLUNT (TROCAR) ×3 IMPLANT
TROCAR XCEL NON-BLD 5MMX100MML (ENDOMECHANICALS) ×3 IMPLANT
WARMER LAPAROSCOPE (MISCELLANEOUS) ×3 IMPLANT

## 2020-05-05 NOTE — Anesthesia Procedure Notes (Signed)
Procedure Name: Intubation Performed by: Willa Frater, CRNA Pre-anesthesia Checklist: Patient identified, Emergency Drugs available, Suction available and Patient being monitored Patient Re-evaluated:Patient Re-evaluated prior to induction Oxygen Delivery Method: Circle system utilized Preoxygenation: Pre-oxygenation with 100% oxygen Induction Type: IV induction Ventilation: Mask ventilation without difficulty Laryngoscope Size: Mac and 3 Grade View: Grade I Tube type: Oral Tube size: 7.0 mm Number of attempts: 1 Airway Equipment and Method: Stylet and Oral airway Placement Confirmation: ETT inserted through vocal cords under direct vision,  positive ETCO2 and breath sounds checked- equal and bilateral Tube secured with: Tape Dental Injury: Teeth and Oropharynx as per pre-operative assessment

## 2020-05-05 NOTE — Op Note (Signed)
PREOPERATIVE DIAGNOSIS:  Retained intrauterine device, right ovarian cyst POSTOPERATIVE DIAGNOSIS: The same PROCEDURE: Hysteroscopy, removal of IUD, right ovarian cystectomy SURGEON:  Dr. Lynnda Shields Assistant: Vivien Rota  An experienced assistant was required given the standard of surgical care given the complexity of the case.  This assistant was needed for exposure, dissection, suctioning, retraction, instrument exchange, and for overall help during the procedure.    INDICATIONS: 49 y.o. [id number]  here for scheduled surgery for the aforementioned diagnoses.   Risks of surgery were discussed with the patient including but not limited to: bleeding which may require transfusion; infection which may require antibiotics; injury to uterus or surrounding organs; intrauterine scarring which may impair future fertility; need for additional procedures including laparotomy or laparoscopy; and other postoperative/anesthesia complications. Written informed consent was obtained.   INDICATIONS for laparoscopy:    Please see preoperative notes for further details.  Risks of surgery were discussed with the patient including but not limited to: bleeding which may require transfusion or reoperation; infection which may require antibiotics; injury to bowel, bladder, ureters or other surrounding organs; need for additional procedures including laparotomy; thromboembolic phenomenon, incisional problems and other postoperative/anesthesia complications. Written informed consent was obtained.    FINDINGS:  A 10 week size uterus.  Retained IUD, large 9-10 cm simple right ovarian cyst, fallope rings on the fallopian tubes, fibroid uterus, pedunculated right fibroid, normal ovaries ANESTHESIA:   General INTRAVENOUS FLUIDS:  1200 ml of LR FLUID DEFICITS:  300 ml of NS Urine output: 1000 ml ESTIMATED BLOOD LOSS:  Less than 20 ml SPECIMENS: Intrauterine device, right ovarian cyst wall COMPLICATIONS:  None  immediate.  PROCEDURE DETAILS:  The patient was then taken to the operating room where general anesthesia was administered and was found to be adequate.  After an adequate timeout was performed, she was placed in the dorsal lithotomy position and examined; then prepped and draped in the sterile manner.   Her bladder was catheterized for an unmeasured amount of clear, yellow urine and secured with the foley catheter. A speculum was then placed in the patient's vagina and a single tooth tenaculum was applied to the anterior lip of the cervix.   The uterus was sounded to 10 cm and the cervix was dilated manually with metal dilators to accommodate the 5 mm diagnostic hysteroscope.  The hysteroscope was then inserted under direct visualization using NS as a suspension medium.  The uterine cavity was carefully examined with the findings as noted above.  Using the hysteroscopic grasper, the central shaft of the IUD was grasped and the IUD was removed in its entirety. The tenaculum was removed from the anterior lip of the cervix and the vaginal speculum was removed after noting good hemostasis.  Monsel's solution was applied to the anterior tenaculum sites to achieve hemostasis.  At this point gown and gloves were exchanged and Dr. Rosana Hoes scrubbed in for the laparoscopy.   The patient had sequential compression devices previously applied to her lower extremities while in the preoperative area.  A uterine manipulator was then advanced into the uterus .  After an adequate timeout was performed, attention was turned to the abdomen where an umbilical incision was made with the scalpel.  The Optiview 5-mm trocar and sleeve were then advanced without difficulty with the laparoscope under direct visualization into the abdomen.  The abdomen was then insufflated with carbon dioxide gas and adequate pneumoperitoneum was obtained.   A 5 mm right lateral port was placed under direct visualization as  was a 12 mm left lateral port.   Each port site had 3 ml of .25 % marcaine placed before port insertion.  A detailed survey of the patient's pelvis and abdomen revealed the findings as mentioned above.   The right ovarian cyst was easily visualized.  It appeared to be a simple cyst with no outward findings of malignancy.  The cyst was opened with laparoscopic scissors and cautery.  Using a suction irrigator, a thin straw colored fluid was suctioned from the cyst interior.  The cyst wall was then pulled from the ovarian serosa and kept for pathology.    The operative site was surveyed, and Loletta Parish was applied for hemostasis.  Interceed was placed over the ovary for adhesion prevention.  No intraoperative injury to surrounding organs was noted.  The abdomen was desufflated and all instruments were then removed from the patient's abdomen.  The left port fascia was closed with 0 vicryl and the skin was closed with 4-0 monocryl.   The uterine manipulator was removed without complications.  All incisions were closed with Dermabond.  Another 3-4 ml of .25 % marcaine was injected at each port site. The patient tolerated the procedures well.  All instruments, needles, and sponge counts were correct x 2. The patient was taken to the recovery room in stable condition.      The patient will be discharged to home as per PACU criteria.  Routine postoperative instructions given.  She was prescribed Percocet, Ibuprofen. She will follow up in the clinic on 2 weeks  for postoperative evaluation.   Lynnda Shields, MD, [place] for [facility], [place]

## 2020-05-05 NOTE — Progress Notes (Signed)
Incision at umbilucus noted to have small amount of serosanguenous drainage. Dermabond applied.

## 2020-05-05 NOTE — H&P (Signed)
H and P update:  Pt seen shortly before the procedure.  Reviewed CT scan and US findings.  Discussed the two procedures and risks and benefits.  Pt last seen 04/16/2020 and formal H and P was done at that time.  No significant changes from the history and physical and no changes in previous plan.  All questions answered.  Will proceed as scheduled.    Lynnda Shields, MD Faculty Attending, Center for [facility] 05/05/20  1217 PM

## 2020-05-05 NOTE — Anesthesia Preprocedure Evaluation (Signed)
Anesthesia Evaluation  Patient identified by MRN, date of birth, ID band Patient awake    Reviewed: Allergy & Precautions, NPO status , Patient's Chart, lab work & pertinent test results  Airway Mallampati: II  TM Distance: >3 FB Neck ROM: Full    Dental no notable dental hx.    Pulmonary asthma , Current Smoker and Patient abstained from smoking.,    Pulmonary exam normal breath sounds clear to auscultation       Cardiovascular hypertension, Pt. on medications negative cardio ROS Normal cardiovascular exam Rhythm:Regular Rate:Normal     Neuro/Psych negative neurological ROS  negative psych ROS   GI/Hepatic negative GI ROS, Neg liver ROS,   Endo/Other  Morbid obesity  Renal/GU negative Renal ROS  negative genitourinary   Musculoskeletal negative musculoskeletal ROS (+)   Abdominal (+) + obese,   Peds negative pediatric ROS (+)  Hematology negative hematology ROS (+)   Anesthesia Other Findings   Reproductive/Obstetrics negative OB ROS                             Anesthesia Physical Anesthesia Plan  ASA: III  Anesthesia Plan: General   Post-op Pain Management:    Induction: Intravenous  PONV Risk Score and Plan: 2 and Ondansetron, Midazolam and Treatment may vary due to age or medical condition  Airway Management Planned: Oral ETT  Additional Equipment:   Intra-op Plan:   Post-operative Plan: Extubation in OR  Informed Consent: I have reviewed the patients History and Physical, chart, labs and discussed the procedure including the risks, benefits and alternatives for the proposed anesthesia with the patient or authorized representative who has indicated his/her understanding and acceptance.     Dental advisory given  Plan Discussed with: CRNA  Anesthesia Plan Comments:         Anesthesia Quick Evaluation

## 2020-05-05 NOTE — Transfer of Care (Signed)
Immediate Anesthesia Transfer of Care Note  Patient: Angela Burton  Procedure(s) Performed: OPERATIVE LAPAROSCOPY AND LAPAROSCOPIC OVARIAN CYSTECTOMY (Right Abdomen) DILATATION AND CURETTAGE /HYSTEROSCOPY AND IUD REMOVAL (N/A Uterus)  Patient Location: PACU  Anesthesia Type:General  Level of Consciousness: awake and drowsy  Airway & Oxygen Therapy: Patient Spontanous Breathing and Patient connected to face mask oxygen  Post-op Assessment: Report given to RN and Post -op Vital signs reviewed and stable  Post vital signs: Reviewed and stable  Last Vitals:  Vitals Value Taken Time  BP 163/94 05/05/20 1448  Temp 36.6 C 05/05/20 1445  Pulse 69 05/05/20 1459  Resp 11 05/05/20 1458  SpO2 97 % 05/05/20 1459  Vitals shown include unvalidated device data.  Last Pain:  Vitals:   05/05/20 1445  TempSrc:   PainSc: Asleep         Complications: No complications documented.

## 2020-05-05 NOTE — Discharge Instructions (Signed)
Post Anesthesia Home Care Instructions  Activity: Get plenty of rest for the remainder of the day. A responsible individual must stay with you for 24 hours following the procedure.  For the next 24 hours, DO NOT: -Drive a car -Paediatric nurse -Drink alcoholic beverages -Take any medication unless instructed by your physician -Make any legal decisions or sign important papers.  Meals: Start with liquid foods such as gelatin or soup. Progress to regular foods as tolerated. Avoid greasy, spicy, heavy foods. If nausea and/or vomiting occur, drink only clear liquids until the nausea and/or vomiting subsides. Call your physician if vomiting continues.  Special Instructions/Symptoms: Your throat may feel dry or sore from the anesthesia or the breathing tube placed in your throat during surgery. If this causes discomfort, gargle with warm salt water. The discomfort should disappear within 24 hours.  If you had a scopolamine patch placed behind your ear for the management of post- operative nausea and/or vomiting:  1. The medication in the patch is effective for 72 hours, after which it should be removed.  Wrap patch in a tissue and discard in the trash. Wash hands thoroughly with soap and water. 2. You may remove the patch earlier than 72 hours if you experience unpleasant side effects which may include dry mouth, dizziness or visual disturbances. 3. Avoid touching the patch. Wash your hands with soap and water after contact with the patch.    Laparoscopic Surgery - Care After Laparoscopy is a surgical procedure. It is used to diagnose and treat diseases inside the belly(abdomen). It is usually a brief, common, and relatively simple procedure. The laparoscopeis a thin, lighted, pencil-sized instrument. It is like a telescope. It is inserted into your abdomen through a small cut (incision). Your caregiver can look at the organs inside your body through this instrument.  She can see if there is  anything abnormal. Laparoscopy can be done either in a hospital or outpatient clinic. You may be given a mild sedative to help you relax before the procedure. Once in the operating room, you will be given a drug to make you sleep (general anesthesia). Laparoscopy usually lasts about 1 hour. After the procedure, you will be monitored in a recovery area until you are stable and doing well. Once you are home, it may take 3 to 7 days to fully recover.   Laparoscopy has relatively few risks. Your caregiver will discuss the risks with you before the procedure. Some problems that can occur include: RISKS AND COMPLICATIONS   Allergies to medicines.  Difficulty breathing.  Bleeding.  Infection.  Damage to other surrounding structures HOME CARE INSTRUCTIONS   Infection.  Bleeding.  Damage to other organs.  Anesthetic side effects.   Need for additional procedures such as open procedures/laparotomy PROCEDURE Once you receive anesthesia, your surgeon inflates the abdomen with a harmless gas (carbon dioxide). This makes the organs easier to see. The laparoscope is inserted into the abdomen through a small incision. This allows your surgeon to see into the abdomen. Other small instruments are also inserted into the abdomen through other small openings. Many surgeons attach a video camera to the laparoscope to enlarge the view. During a laparoscopy, the surgeon may be looking for inflammation, infection, or cancer.  The surgeon may also need to take out certain organs or take tissue samples (biopsies). The specimens are sent to a specialist in looking at cells and tissue samples (pathologist). The pathologist examines them under a microscope to help to diagnose or confirm  a disease. AFTER THE PROCEDURE   The incisions are closed with stitches (sutures) and Dermabond. Because these incisions are small (usually less than 1/2 inch), there is usually minimal discomfort after the procedure. There may  also be discomfort from the instrument placement incisions in the abdomen. You will be given pain medicine to ease any discomfort.  You will rest in a recovery room for 1-2 hours until you are stable and doing well.  You may have some mild discomfort in the throat. This is from the tube placed in your throat while you were sleeping.  You may experience discomfort in the shoulder area from some trapped air between the liver and diaphragm. This sensation is normal and will slowly go away on its own.  The recovery time is shortened as long as there are no complications.  You will rest in a recovery room until stable and doing well. As long as there are no complications, you may be allowed to go home. Someone will need to drive you home and be with you for at least 24 hours once home. FINDING OUT THE RESULTS You will be called with the results of the pathology and will discuss these results with  your caregiver during your postoperative appointment. Do not assume everything is normal if you have not heard from your caregiver or the medical facility. It is important for you to follow up on all of your results. HOME CARE INSTRUCTIONS   Take all medicines as directed.  Only take over-the-counter or prescription medicines for pain, discomfort, or fever as directed by your caregiver.  Resume daily activities as directed.  Showers are preferred over baths.  You may resume sexual activities in 1 week or as directed.  Do not drive while taking narcotics. SEEK MEDICAL CARE IF:  There is increasing abdominal pain.  You feel lightheaded or faint.  You have the chills.  You have an oral temperature above 102 F (38.9 C).  There is pus-like (purulent) drainage from any of the wounds.  You are unable to pass gas or have a bowel movement.  You feel sick to your stomach (nauseous) or throw up (vomit). MAKE SURE YOU:   Understand these instructions.  Will watch your condition.  Will get  help right away if you are not doing well or get worse.  ExitCare Patient Information 2013 [place]. Ovarian Cystectomy, Care After This sheet gives you information about how to care for yourself after your procedure. Your health care provider may also give you more specific instructions. If you have problems or questions, contact your health care provider. What can I expect after the procedure? After the procedure, it is common to have:  Pain in the abdomen, especially at the incision areas. You will be given pain medicines to control the pain.  Tiredness. This is a normal part of the recovery process. Your energy level will return to normal over the next several weeks. Follow these instructions at home: Medicines  Take over-the-counter and prescription medicines only as told by your health care provider.  If you were prescribed an antibiotic medicine, use it as told by your health care provider. Do not stop using the antibiotic even if you start to feel better.  Do not take aspirin because it can cause bleeding.  Ask your health care provider if the medicine prescribed to you: ? Requires you to avoid driving or using heavy machinery. ? Can cause constipation. You may need to take these actions to prevent or  treat constipation:  Drink enough fluid to keep your urine pale yellow.  Take over-the-counter or prescription medicines.  Eat foods that are high in fiber, such as beans, whole grains, and fresh fruits and vegetables.  Limit foods that are high in fat and processed sugars, such as fried or sweet foods. Incision care   Follow instructions from your health care provider about how to take care of your incisions. Make sure you: ? Wash your hands with soap and water before and after you change your bandage (dressing). If soap and water are not available, use hand sanitizer. ? Change your dressing as told by your health care provider. ? Leave stitches (sutures), skin glue,  or adhesive strips in place. These skin closures may need to stay in place for 2 weeks or longer. If adhesive strip edges start to loosen and curl up, you may trim the loose edges. Do not remove adhesive strips completely unless your health care provider tells you to do that.  Check your incision areas every day for signs of infection. Check for: ? Redness, swelling, or pain. ? Fluid or blood. ? Warmth. ? Pus or a bad smell.  Do not take baths, swim, or use a hot tub until your health care provider approves. Ask your health care provider if you may take showers. You may only be allowed to take sponge baths. Activity  Rest as told by your health care provider.  Avoid sitting for a long time without moving. Get up to take short walks every 1-2 hours. This is important to improve blood flow and breathing. Ask for help if you feel weak or unsteady.  Do not lift anything that is heavier than 10 lb (4.5 kg), or the limit that you are told, until your health care provider says that it is safe.  Return to your normal activities and diet as told by your health care provider. Ask your health care provider what activities are safe for you. General instructions  Do not douche, use tampons, or have sexual intercourse until your health care provider says it is okay to do so.  Do not use any products that contain nicotine or tobacco, such as cigarettes, e-cigarettes, and chewing tobacco. These can delay incision healing after surgery. If you need help quitting, ask your health care provider.  Keep all follow-up visits as told by your health care provider. This is important. Contact a health care provider if:  You have a fever.  You feel nauseous or you vomit.  You have pain when you urinate or have blood in your urine.  You have a rash on your body.  You have pain or redness where the IV was inserted.  You have pain that is not relieved with medicine.  You have any of these signs of  infection: ? Redness, swelling, or pain around your incisions. ? Fluid or blood coming from your incisions. ? Warmth coming from an incision. ? Pus or a bad smell coming from your incisions. Get help right away if:  You have chest pain or shortness of breath.  You feel dizzy or light-headed.  You have heavy bleeding.  You have increasing abdominal pain that is not relieved with medicines.  You have pain, swelling, or redness in your leg.  Your incision is opening (the edges are not staying together). Summary  After the procedure, it is common to have some pain in your abdomen. You will be given pain medicines to control the pain.  Follow instructions  from your health care provider about how to take care of your incisions.  Do not douche, use tampons, or have sexual intercourse until your health care provider says it is okay to do so.  Keep all follow-up visits as told by your health care provider. This is important. This information is not intended to replace advice given to you by your health care provider. Make sure you discuss any questions you have with your health care provider. Document Revised: 03/07/2019 Document Reviewed: 03/07/2019 Elsevier Patient Education  [place].

## 2020-05-06 LAB — SURGICAL PATHOLOGY

## 2020-05-06 NOTE — Anesthesia Postprocedure Evaluation (Signed)
Anesthesia Post Note  Patient: Angela Burton  Procedure(s) Performed: OPERATIVE LAPAROSCOPY AND LAPAROSCOPIC OVARIAN CYSTECTOMY (Right Abdomen) DILATATION AND CURETTAGE /HYSTEROSCOPY AND IUD REMOVAL (N/A Uterus)     Patient location during evaluation: PACU Anesthesia Type: General Level of consciousness: awake and alert Pain management: pain level controlled Vital Signs Assessment: post-procedure vital signs reviewed and stable Respiratory status: spontaneous breathing, nonlabored ventilation and respiratory function stable Cardiovascular status: blood pressure returned to baseline and stable Postop Assessment: no apparent nausea or vomiting Anesthetic complications: no   No complications documented.  Last Vitals:  Vitals:   05/05/20 1700 05/05/20 1730  BP: (!) 159/94   Pulse: 69   Resp: 16 16  Temp: 37.4 C   SpO2: 100% 100%    Last Pain:  Vitals:   05/05/20 1730  TempSrc:   PainSc: 0-No pain   Pain Goal:                   Lynda Rainwater

## 2020-05-11 ENCOUNTER — Encounter (HOSPITAL_BASED_OUTPATIENT_CLINIC_OR_DEPARTMENT_OTHER): Payer: Self-pay | Admitting: Obstetrics and Gynecology

## 2020-05-11 ENCOUNTER — Telehealth: Payer: Self-pay | Admitting: Obstetrics and Gynecology

## 2020-05-11 NOTE — Telephone Encounter (Signed)
Received a call from this patient requesting to see Dr. Elgie Congo. She is having some pain on the left side of her incision, and her belly button.

## 2020-05-11 NOTE — Telephone Encounter (Signed)
Called patient regarding pain from surgery and needing an appt. Patient states she drove to the store recently and has been up moving around more and her pain is back increased again. She also reports the glue has came off her incision and it is now open. Patient is taking percocet & ibuprofen occasionally. Discussed with patient it is common for pain to increase again once activity starts to increase and discussed normal post op pain. Advised she take ibuprofen around the clock q 6 hours over the next several days to see if pain improves. Told patient to call us back if that doesn't improve her pain and pain is still significant and we can try to work her in with Dr Elgie Congo. Patient verbalized understanding and states sometimes she feels a heaviness in the bottom of her belly and like she needs to support it especially with coughing/sneezing. Told patient that can be normal feeling as well. Confirmed with patient her incision is not actually open but the skin is slightly exposed like a fresh cut. Patient denies drainage/foul odor. Patient asked if she could put a bandaid over it since the glue is off and I told her that was fine. Patient also reports excessively bleeding since surgery. Asked patient if she was on her period and she states she guesses so. Patient reports having her IUD removed during surgery and her last period before that was in May. Discussed initial periods after removing birth control and after not having a period in a while can be heavier/last longer. Discussed if she is having excessive bleeding to the point where she is saturating a pad in less than an hour over several hours to let us know. Patient states her bleeding was doing that but it has seemed to slow down now. Told patient ibuprofen may help with that as well. Told patient to call us back if bleeding becomes excessive again. Patient verbalized understanding & had no other questions.

## 2020-05-14 ENCOUNTER — Other Ambulatory Visit: Payer: Self-pay

## 2020-05-14 ENCOUNTER — Encounter: Payer: Self-pay | Admitting: Obstetrics and Gynecology

## 2020-05-14 ENCOUNTER — Ambulatory Visit (INDEPENDENT_AMBULATORY_CARE_PROVIDER_SITE_OTHER): Payer: Self-pay | Admitting: Obstetrics and Gynecology

## 2020-05-14 VITALS — BP 140/91 | HR 65 | Wt 250.2 lb

## 2020-05-14 DIAGNOSIS — R1084 Generalized abdominal pain: Secondary | ICD-10-CM

## 2020-05-14 DIAGNOSIS — N926 Irregular menstruation, unspecified: Secondary | ICD-10-CM

## 2020-05-14 MED ORDER — MEDROXYPROGESTERONE ACETATE 10 MG PO TABS: 10.0000 mg | ORAL_TABLET | Freq: Every day | ORAL | 1 refills | Status: DC

## 2020-05-14 NOTE — Progress Notes (Signed)
   Subjective:    Patient ID: Angela Burton, female    DOB: 1970/11/03, 49 y.o.   MRN: [id number]  HPI Pt seen for follow up for umbilical and left lateral port site pain 9 days post op from hysteroscopic IUD removal and right ovarian cystectomy.  The patient has been able to eat and denies fever/chills.  She has been more anxious/had panic attacks after the procedure and almost passed out a few times.  She notes urination without difficulty and is able to eat.  She notes some flatus and decreased bowel movements.  She denies any pus from her port sites.   Review of Systems  Constitutional: Positive for fatigue. Negative for chills, diaphoresis and fever.  HENT: Negative.   Eyes: Negative.   Respiratory: Negative.   Cardiovascular: Negative.   Gastrointestinal: Positive for constipation and nausea. Negative for diarrhea and vomiting.  Endocrine: Negative.   Genitourinary: Positive for vaginal bleeding. Negative for difficulty urinating and dysuria.  Allergic/Immunologic: Negative.   Neurological: Negative.   Hematological: Negative.   Psychiatric/Behavioral: Negative.        Objective:   Physical Exam Constitutional:      Appearance: Normal appearance. She is obese.  HENT:     Head: Normocephalic and atraumatic.  Cardiovascular:     Rate and Rhythm: Normal rate and regular rhythm.     Heart sounds: Normal heart sounds.  Pulmonary:     Effort: Pulmonary effort is normal.     Breath sounds: Normal breath sounds.  Abdominal:     General: Abdomen is flat. There is no distension.     Palpations: Abdomen is soft. There is no mass.     Tenderness: There is no guarding.     Hernia: No hernia is present.     Comments: Hypoactive bowel sounds, mild tenderness suprapubic and LLQ  Genitourinary:    General: Normal vulva.     Comments: No CMT, no adnexal mass/pain, no uterine tenderness, light uterine bleeding with old blood Musculoskeletal:        General: Normal range of motion.    Skin:    Comments: Right port site: WNL Left port site: honeycomb just removed, no erythema or drainage, no bruising, slight separation of skin, not excessively tender Umbilical site: slight skin separation otherwise clean  Neurological:     Mental Status: She is alert.           Assessment & Plan:   1. Irregular menses rx given to decrease bleeding while she continues to heal - medroxyPROGESTERone (PROVERA) 10 MG tablet; Take 1 tablet (10 mg total) by mouth daily. Use for ten days  Dispense: 10 tablet; Refill: 1  2. Generalized abdominal pain Will check CT for any sign of infection, pt does not appear infected and she does not have a surgical abdomen, f/u in 1 week - CT ABDOMEN PELVIS W CONTRAST; Future

## 2020-05-15 ENCOUNTER — Ambulatory Visit: Payer: Self-pay

## 2020-05-15 ENCOUNTER — Telehealth: Payer: Self-pay

## 2020-05-15 NOTE — Telephone Encounter (Signed)
CT scheduled for 05/26/20 at 0800; pt to arrive at [place]. Go to radiology at [facility] to pick up prep anytime prior to day of CT. The morning of appt pt to drink 1 bottle at 6am and 1 bottle at 7am. NPO 4 hours prior. Instructions given via MyChart as previously discussed at visit yesterday.

## 2020-05-21 ENCOUNTER — Encounter: Payer: Self-pay | Admitting: Obstetrics and Gynecology

## 2020-05-21 ENCOUNTER — Ambulatory Visit (INDEPENDENT_AMBULATORY_CARE_PROVIDER_SITE_OTHER): Payer: Self-pay | Admitting: Obstetrics and Gynecology

## 2020-05-21 ENCOUNTER — Other Ambulatory Visit: Payer: Self-pay

## 2020-05-21 DIAGNOSIS — Z4889 Encounter for other specified surgical aftercare: Secondary | ICD-10-CM | POA: Insufficient documentation

## 2020-05-21 NOTE — Progress Notes (Signed)
    Subjective:    Angela Burton is a 49 y.o. female who presents to the clinic status post hysteroscopic removal of IUD and laparoscopic right ovarian cystectomy on 05/05/20. The patient is not having any pain.  Eating a regular diet without difficulty. Bowel movements are normal. No other significant postoperative concerns.  The following portions of the patient's history were reviewed and updated as appropriate: allergies, current medications, past family history, past medical history, past social history, past surgical history and problem list..  Last pap smear was unlisted.  Review of Systems Pertinent items noted in HPI and remainder of comprehensive ROS otherwise negative.   Objective:   BP (!) 135/99   Pulse 83   Wt 247 lb (112 kg)   BMI 39.87 kg/m  Constitutional:  Well-developed, well-nourished female in no acute distress.   Skin: Skin is warm and dry, no rash noted, not diaphoretic,no erythema, no pallor.  Cardiovascular: Normal heart rate noted  Respiratory: Effort and breath sounds normal, no problems with respiration noted  Abdomen: Soft, bowel sounds active, non-tender, no abnormal masses  Incision: Healing well, no drainage, no erythema, no hernia, no seroma, no swelling, no dehiscence, incision well approximated  Pelvic:   Deferred   Surgical pathology () Benign serous cystadenoma Assessment:   Doing well postoperatively.  Operative findings again reviewed. Pathology report discussed.   Plan:   1. Continue any current medications. 2. Wound care discussed. 3. Activity restrictions: none 4. Anticipated return to work: now. 5. Follow up as needed 6.  Routine preventative health maintenance measures emphasized. Please refer to After Visit Summary for other counseling recommendations.  7.  Pt still have some residual vaginal bleeding.  She forgot to pick up the progesterone prescribed at the earlier visit.  Pt will start today.  If effective, pt will reschedule  for IUD placement.  Will check if she has had pap smear outside of [facility] system first.   Lynnda Shields, MD, [place] Attending [place] for [facility], [place]

## 2020-05-26 ENCOUNTER — Ambulatory Visit (HOSPITAL_COMMUNITY): Payer: Self-pay

## 2020-05-26 NOTE — H&P (Signed)
Interval H and P from 04/16/2020  (original)  Faculty Practice Obstetrics and Gynecology Attending History and Physical   Angela Burton is a 49 y.o. [phone] at [facility] today for preoperative examination for retained intrauterine device and right ovarian cyst.  Discussed procedures including hysteroscopic removal of IUD and diagnostic laparoscopy with right cystectomy.  Pt is aware of risks and benefits of each procedure.  History and medications reviewed. Denies any abnormal vaginal discharge, fevers, chills, sweats, dysuria, nausea, vomiting, other GI or GU symptoms or other general symptoms.       Past Medical History:  Diagnosis Date  . Asthma      albuterol  . Hypertension    . Obesity           Past Surgical History:  Procedure Laterality Date  . DILATION AND CURETTAGE OF UTERUS      . TUBAL LIGATION                        OB History  Gravida Para Term Preterm AB Living  3 2 2  0 1 2  SAB TAB Ectopic Multiple Live Births     0 1 0 0 2        # Outcome Date GA Lbr Len/2nd Weight Sex Delivery Anes PTL Lv  3 TAB                    2 Term           Vag-Spont        1 Term           Vag-Spont        Patient denies any other pertinent gynecologic issues.        Current Outpatient Medications on File Prior to Visit  Medication Sig Dispense Refill  . ibuprofen (ADVIL) 800 MG tablet Take 1 tablet (800 mg total) by mouth every 8 (eight) hours as needed for moderate pain or cramping. 30 tablet 2  . losartan-hydrochlorothiazide (HYZAAR) 50-12.5 MG tablet Take 1 tablet by mouth daily. 90 tablet 3  . acetaminophen (TYLENOL) 500 MG tablet Take 1,000 mg by mouth every 6 (six) hours as needed for moderate pain or headache. (Patient not taking: Reported on 04/16/2020)      . albuterol (PROVENTIL HFA;VENTOLIN HFA) 108 (90 Base) MCG/ACT inhaler Inhale 1-2 puffs into the lungs every 6 (six) hours as needed for wheezing or shortness of breath. (Patient not taking: Reported on 03/24/2020) 1 Inhaler 2     No current facility-administered medications on file prior to visit.         Allergies  Allergen Reactions  . Sulfa Antibiotics Anaphylaxis, Hives, Swelling and Rash  . Aspirin Hives  . Erythromycin Other (See Comments)      Had a seizure.       Social History:   reports that she has been smoking cigarettes. She has been smoking about 0.25 packs per day. She has never used smokeless tobacco. She reports current alcohol use of about 2.0 standard drinks of alcohol per week. She reports current drug use. Drug: Marijuana.      Family History  Problem Relation Age of Onset  . Heart attack Mother    . Diabetes Father    . Hypertension Father    . High Cholesterol Father    . Breast cancer Sister    . HIV Sister        Review of Systems: Pertinent items noted in  HPI and remainder of comprehensive ROS otherwise negative.   PHYSICAL EXAM: Blood pressure 138/90, pulse 76, height 5\' 6"  (1.676 m), weight 246 lb (111.6 kg). CONSTITUTIONAL: Well-developed, well-nourished female in no acute distress.  HENT:  Normocephalic, atraumatic, External right and left ear normal. Oropharynx is clear and moist EYES: Conjunctivae and EOM are normal. Pupils are equal, round, and reactive to light. No scleral icterus.  NECK: Normal range of motion, supple, no masses SKIN: Skin is warm and dry. No rash noted. Not diaphoretic. No erythema. No pallor. NEUROLOGIC: Alert and oriented to person, place, and time. Normal reflexes, muscle tone coordination. No cranial nerve deficit noted. PSYCHIATRIC: Normal mood and affect. Normal behavior. Normal judgment and thought content. CARDIOVASCULAR: Normal heart rate noted, regular rhythm RESPIRATORY: Effort and breath sounds normal, no problems with respiration noted ABDOMEN: Soft, nontender, nondistended, obese, previous laparoscopic scar noted at umbilicus. MUSCULOSKELETAL: Normal range of motion. No tenderness.  No cyanosis, clubbing, or edema.  2+ distal pulses.     Labs: No results found for this or any previous visit (from the past 336 hour(s)).   Imaging Studies:  Imaging Results  US PELVIS TRANSVAGINAL NON-OB (TV ONLY)   Result Date: 03/30/2020 CLINICAL DATA:  Presence of IUD.  Previous tubal ligation. EXAM: ULTRASOUND PELVIS TRANSVAGINAL TECHNIQUE: Transvaginal ultrasound examination of the pelvis was performed including evaluation of the uterus, ovaries, adnexal regions, and pelvic cul-de-sac. COMPARISON:  02/14/2020 as well as CT 02/14/2020 FINDINGS: Uterus Measurements: 10.5 x 9.1 x 9.1 cm = volume: 452 mL. Three intrauterine masses with the largest over the fundus measuring 5.8 cm unchanged compatible with leiomyomas. The other 2 leiomyomas measure 2 cm and 3.9 cm respectively located over the posterior uterine body. IUD is somewhat difficult to visualize due to the adjacent fibroids although appears to be over the endometrium left of midline. Endometrium Difficult visualization due to the adjacent leiomyomas. Thickness: 8.7 mm. No focal abnormality visualized. Right ovary Measurements: 10.5 x 8.2 x 8.8 cm = volume: 395 mL. Stable 8.9 cm simple cyst. Left ovary Measurements: Not visualized. Other findings:  No abnormal free fluid IMPRESSION: 1. Slightly enlarged uterus containing 3 leiomyomas with the largest measuring 5.8 cm over the fundus. Normal endometrium with IUD in adequate position. 2.  8.9 cm simple right ovarian cyst.  Left ovary not visualized. Electronically Signed   By: Marin Olp M.D.   On: 03/30/2020 16:00       Assessment: Right ovarian cyst: Laparoscopy with right ovarian cystectomy.  Risks and benefits of the procedure given including bleeding, infection, involvement of other organs including bladder and bowel.  Small risk of oophorectomy   Retained IUD: Hysteroscopy will be performed.  Risks and benefits discussed including bleeding, infection, involvement of other organs as well as uterine perforation.   Plan: Diagnostic  laparoscopy with right ovarian cystectomy, possible laparotomy Operative hysteroscopy with removal of retained IUD     Lynnda Shields, MD, [place], [facility] for [facility] [facility]        Note Details  Author Elgie Congo, Carolann Littler, MD File Time 04/16/2020  4:38 PM  Author Type Physician Status Signed  Last Editor [name], [place] # [id number] [place] Date 05/05/2020

## 2020-06-03 ENCOUNTER — Other Ambulatory Visit: Payer: Self-pay | Admitting: Critical Care Medicine

## 2020-06-03 ENCOUNTER — Other Ambulatory Visit: Payer: Self-pay | Admitting: *Deleted

## 2020-06-03 MED ORDER — HYDROCHLOROTHIAZIDE 25 MG PO TABS
25.0000 mg | ORAL_TABLET | Freq: Every day | ORAL | 1 refills | Status: DC
Start: 2020-06-03 — End: 2020-11-27

## 2020-06-03 MED ORDER — ALBUTEROL SULFATE HFA 108 (90 BASE) MCG/ACT IN AERS: 1.0000 | INHALATION_SPRAY | Freq: Four times a day (QID) | RESPIRATORY_TRACT | 1 refills | Status: DC | PRN

## 2020-06-03 NOTE — Telephone Encounter (Signed)
Medication Refill - Medication: hydrochlorothiazide, amlodipine, albuterol   Has the patient contacted their pharmacy? Yes.   (Agent: If no, request that the patient contact the pharmacy for the refill.) (Agent: If yes, when and what did the pharmacy advise?)  Preferred Pharmacy (with phone number or street name):      [facility] [address], [address]  [place] [address]  Phone: [phone] Fax: [phone]  Hours: Open 24 hours          Agent: Please be advised that RX refills may take up to 3 business days. We ask that you follow-up with your pharmacy.

## 2020-06-03 NOTE — Telephone Encounter (Signed)
Requested medication (s) are due for refill today - yes  Requested medication (s) are on the active medication list -yes  Future visit scheduled -yes  Last refill: 1 month  Notes to clinic: Please update Rx- filled last by historical provider( patient is presently taking this medication). Also please reorder albuterol- it is in print mode and needs to be sent to preferred pharmacy-[facility]- I tried to correct it- but it did not accept.  Requested Prescriptions  Pending Prescriptions Disp Refills   amLODipine (NORVASC) 10 MG tablet      Sig: Take 1 tablet (10 mg total) by mouth daily.      Cardiovascular:  Calcium Channel Blockers Failed - 06/03/2020  2:15 PM      Failed - Last BP in normal range    BP Readings from Last 1 Encounters:  05/21/20 (!) 135/99          Passed - Valid encounter within last 6 months    Recent Outpatient Visits           1 month ago Essential hypertension   [place], MD       Future Appointments             In 2 weeks Elsie Stain, MD [place]             Signed Prescriptions Disp Refills   hydrochlorothiazide (HYDRODIURIL) 25 MG tablet 90 tablet 1    Sig: Take 1 tablet (25 mg total) by mouth daily.      Cardiovascular: Diuretics - Thiazide Failed - 06/03/2020  2:15 PM      Failed - Last BP in normal range    BP Readings from Last 1 Encounters:  05/21/20 (!) 135/99          Passed - Ca in normal range and within 360 days    Calcium  Date Value Ref Range Status  05/01/2020 9.1 8.9 - 10.3 mg/dL Final   Calcium, Ion  Date Value Ref Range Status  08/22/2014 1.08 (L) 1.12 - 1.23 mmol/L Final          Passed - Cr in normal range and within 360 days    Creatinine, Ser  Date Value Ref Range Status  05/01/2020 0.84 0.44 - 1.00 mg/dL Final          Passed - K in normal range and within 360 days    Potassium  Date Value Ref Range Status   05/01/2020 4.4 3.5 - 5.1 mmol/L Final          Passed - Na in normal range and within 360 days    Sodium  Date Value Ref Range Status  05/01/2020 140 135 - 145 mmol/L Final  04/22/2020 140 134 - 144 mmol/L Final          Passed - Valid encounter within last 6 months    Recent Outpatient Visits           1 month ago Essential hypertension   [place], MD       Future Appointments             In 2 weeks Elsie Stain, MD [place]              albuterol (VENTOLIN HFA) 108 (90 Base) MCG/ACT inhaler 18 g 1    Sig: Inhale 1-2 puffs  into the lungs every 6 (six) hours as needed for wheezing or shortness of breath.      Pulmonology:  Beta Agonists Failed - 06/03/2020  2:15 PM      Failed - One inhaler should last at least one month. If the patient is requesting refills earlier, contact the patient to check for uncontrolled symptoms.      Passed - Valid encounter within last 12 months    Recent Outpatient Visits           1 month ago Essential hypertension   [place], MD       Future Appointments             In 2 weeks Elsie Stain, MD [place]                Requested Prescriptions  Pending Prescriptions Disp Refills   amLODipine (NORVASC) 10 MG tablet      Sig: Take 1 tablet (10 mg total) by mouth daily.      Cardiovascular:  Calcium Channel Blockers Failed - 06/03/2020  2:15 PM      Failed - Last BP in normal range    BP Readings from Last 1 Encounters:  05/21/20 (!) 135/99          Passed - Valid encounter within last 6 months    Recent Outpatient Visits           1 month ago Essential hypertension   [place], MD       Future Appointments             In 2 weeks Elsie Stain, MD [place]             Signed Prescriptions Disp Refills   hydrochlorothiazide (HYDRODIURIL) 25 MG tablet 90 tablet 1    Sig: Take 1 tablet (25 mg total) by mouth daily.      Cardiovascular: Diuretics - Thiazide Failed - 06/03/2020  2:15 PM      Failed - Last BP in normal range    BP Readings from Last 1 Encounters:  05/21/20 (!) 135/99          Passed - Ca in normal range and within 360 days    Calcium  Date Value Ref Range Status  05/01/2020 9.1 8.9 - 10.3 mg/dL Final   Calcium, Ion  Date Value Ref Range Status  08/22/2014 1.08 (L) 1.12 - 1.23 mmol/L Final          Passed - Cr in normal range and within 360 days    Creatinine, Ser  Date Value Ref Range Status  05/01/2020 0.84 0.44 - 1.00 mg/dL Final          Passed - K in normal range and within 360 days    Potassium  Date Value Ref Range Status  05/01/2020 4.4 3.5 - 5.1 mmol/L Final          Passed - Na in normal range and within 360 days    Sodium  Date Value Ref Range Status  05/01/2020 140 135 - 145 mmol/L Final  04/22/2020 140 134 - 144 mmol/L Final          Passed - Valid encounter within last 6 months    Recent Outpatient Visits           1 month ago Essential  hypertension   [place] Elsie Stain, MD       Future Appointments             In 2 weeks Elsie Stain, MD [place]              albuterol (VENTOLIN HFA) 108 [phone] Base) MCG/ACT inhaler 18 g 1    Sig: Inhale 1-2 puffs into the lungs every 6 (six) hours as needed for wheezing or shortness of breath.      Pulmonology:  Beta Agonists Failed - 06/03/2020  2:15 PM      Failed - One inhaler should last at least one month. If the patient is requesting refills earlier, contact the patient to check for uncontrolled symptoms.      Passed - Valid encounter within last 12 months    Recent Outpatient Visits           1 month ago Essential hypertension   [place], MD       Future Appointments             In 2 weeks Elsie Stain, MD [place]

## 2020-06-03 NOTE — Telephone Encounter (Signed)
Call to patient- she was not aware that she could get her prescriptions at her regular pharmacy- [facility]. She would like to fill her Rx there. She reports she did not get the inhaler- she was not aware it had been sent in. Advised would have to send one for review due to outside prescriber but should not be problem.

## 2020-06-04 MED ORDER — AMLODIPINE BESYLATE 10 MG PO TABS
10.0000 mg | ORAL_TABLET | Freq: Every day | ORAL | 1 refills | Status: DC
Start: 2020-06-04 — End: 2021-02-04

## 2020-06-08 ENCOUNTER — Ambulatory Visit: Payer: Self-pay | Admitting: Internal Medicine

## 2020-06-11 ENCOUNTER — Other Ambulatory Visit: Payer: Self-pay | Admitting: Obstetrics and Gynecology

## 2020-06-11 DIAGNOSIS — Z1231 Encounter for screening mammogram for malignant neoplasm of breast: Secondary | ICD-10-CM

## 2020-06-22 ENCOUNTER — Other Ambulatory Visit (HOSPITAL_COMMUNITY)
Admission: RE | Admit: 2020-06-22 | Discharge: 2020-06-22 | Disposition: A | Discharge status: Home / Self Care | Payer: Self-pay | Source: Ambulatory Visit | Attending: Obstetrics and Gynecology | Admitting: Obstetrics and Gynecology

## 2020-06-22 ENCOUNTER — Ambulatory Visit (INDEPENDENT_AMBULATORY_CARE_PROVIDER_SITE_OTHER): Payer: Self-pay | Admitting: Obstetrics and Gynecology

## 2020-06-22 ENCOUNTER — Other Ambulatory Visit: Payer: Self-pay

## 2020-06-22 ENCOUNTER — Encounter: Payer: Self-pay | Admitting: Obstetrics and Gynecology

## 2020-06-22 DIAGNOSIS — N926 Irregular menstruation, unspecified: Secondary | ICD-10-CM | POA: Insufficient documentation

## 2020-06-22 DIAGNOSIS — Z30430 Encounter for insertion of intrauterine contraceptive device: Secondary | ICD-10-CM | POA: Insufficient documentation

## 2020-06-22 DIAGNOSIS — Z01812 Encounter for preprocedural laboratory examination: Secondary | ICD-10-CM

## 2020-06-22 HISTORY — DX: Irregular menstruation, unspecified: N92.6

## 2020-06-22 LAB — POCT PREGNANCY, URINE: Preg Test, Ur: NEGATIVE

## 2020-06-22 MED ORDER — LEVONORGESTREL 19.5 MCG/DAY IU IUD: INTRAUTERINE_SYSTEM | Freq: Once | INTRAUTERINE | Status: AC

## 2020-06-22 NOTE — Progress Notes (Signed)
ENDOMETRIAL BIOPSY      Angela Burton is a 49 y.o. [id number] here for endometrial biopsy.  She desires a progesterone IUD but notes irregular menses the last 2-3 months.  Prior to IUD placement the endometrial biopsy and pap were taken first.  The indications for endometrial biopsy were reviewed.  Risks of the biopsy including cramping, bleeding, infection, uterine perforation, inadequate specimen and need for additional procedures were discussed. The patient states she understands and agrees to undergo procedure today. Consent was signed. Time out was performed.   Indications: irregular, prolonged menses Urine HCG: negative  A bivalve speculum was placed into the vagina and the cervix was easily visualized and was prepped with Betadine x2. A single-toothed tenaculum was placed on the anterior lip of the cervix to stabilize it. The 3 mm pipelle was introduced into the endometrial cavity without difficulty to a depth of 10 cm, and a moderate amount of tissue was obtained and sent to pathology. This was repeated for a total of 3 passes. The instruments were removed from the patient's vagina. Minimal bleeding from the cervix at the tenaculum was noted.   The patient tolerated the procedure well. Routine post-procedure instructions were given to the patient.    Will base further management on results of biopsy.      GYNECOLOGY OFFICE PROCEDURE NOTE  Angela Burton is a 49 y.o. [id number] here for [place] IUD insertion. No GYN concerns.  Last pap smear was on 3 years ago and was normal.  Pap was taken today prior to IUD placement.  IUD Insertion Procedure Note Patient identified, informed consent performed, consent signed.   Discussed risks of irregular bleeding, cramping, infection, malpositioning or misplacement of the IUD outside the uterus which may require further procedure such as laparoscopy. Also discussed >99% contraception efficacy, increased risk of ectopic pregnancy with failure of method.   Time out was performed.  Urine pregnancy test negative.  Speculum placed in the vagina.  Cervix visualized.  Cleaned with Betadine x 2.  Grasped anteriorly with a single tooth tenaculum.  Uterus sounded to 10 cm.  Liletta IUD placed per manufacturer's recommendations.  Strings trimmed to 3 cm. Tenaculum was removed, good hemostasis noted.  Patient tolerated procedure well.   Patient was given post-procedure instructions.  She was advised to have backup contraception for one week.  Patient was also asked to check IUD strings periodically and follow up in 4 weeks for IUD check.   Lynnda Shields, MD, [place] for [facility], [place]

## 2020-06-22 NOTE — Patient Instructions (Signed)
Endometrial Biopsy, Care After This sheet gives you information about how to care for yourself after your procedure. Your health care provider may also give you more specific instructions. If you have problems or questions, contact your health care provider. What can I expect after the procedure? After the procedure, it is common to have:  Mild cramping.  A small amount of vaginal bleeding for a few days. This is normal. Follow these instructions at home:   Take over-the-counter and prescription medicines only as told by your health care provider.  Do not douche, use tampons, or have sexual intercourse until your health care provider approves.  Return to your normal activities as told by your health care provider. Ask your health care provider what activities are safe for you.  Follow instructions from your health care provider about any activity restrictions, such as restrictions on strenuous exercise or heavy lifting. Contact a health care provider if:  You have heavy bleeding, or bleed for longer than 2 days after the procedure.  You have bad smelling discharge from your vagina.  You have a fever or chills.  You have a burning sensation when urinating or you have difficulty urinating.  You have severe pain in your lower abdomen. Get help right away if:  You have severe cramps in your stomach or back.  You pass large blood clots.  Your bleeding increases.  You become weak or light-headed, or you pass out. Summary  After the procedure, it is common to have mild cramping and a small amount of vaginal bleeding for a few days.  Do not douche, use tampons, or have sexual intercourse until your health care provider approves.  Return to your normal activities as told by your health care provider. Ask your health care provider what activities are safe for you. This information is not intended to replace advice given to you by your health care provider. Make sure you discuss any  questions you have with your health care provider. Document Revised: 07/21/2017 Document Reviewed: 08/24/2016 Elsevier Patient Education  2020 [employer]. IUD PLACEMENT POST-PROCEDURE INSTRUCTIONS  1. You may take Ibuprofen, Aleve or Tylenol for pain if needed.  Cramping should resolve within in 24 hours.  2. You may have a small amount of spotting.  You should wear a mini pad for the next few days.  3. You may have intercourse after 24 hours.  If you using this for birth control, it is effective immediately.  4. You need to call if you have any pelvic pain, fever, heavy bleeding or foul smelling vaginal discharge.  Irregular bleeding is common the first several months after having an IUD placed. You do not need to call for this reason unless you are concerned.  5. Shower or bathe as normal  6. You should have a follow-up appointment in 4-8 weeks for a re-check to make sure you are not having any problems.

## 2020-06-23 ENCOUNTER — Ambulatory Visit: Payer: Self-pay | Admitting: Critical Care Medicine

## 2020-06-23 NOTE — Progress Notes (Deleted)
Subjective:    Patient ID: Angela Burton, female    DOB: October 23, 1970, 49 y.o.   MRN: [id number]  03/24/20 visit at mobile health clinic  this is a 49 year old female who comes to the mobile medicine clinic today without a primary care provider wishing to have her blood pressure evaluated  The patient was earlier at gynecology and evaluated for ovarian cyst and uterine fibroids and has an ultrasound pending for evaluation of this and also for intrauterine device that is in place  The patient currently smokes 3 cigarettes daily.  The patient is currently not on any antihypertensive medications. There is a past history of asthma but she does not have a current albuterol inhaler.  She was given ibuprofen 800 mg today at the gynecology office for her abdominal pain which is related to the cyst in the ovary and fibroids  The patient denies chest pain shortness of breath headaches or dizziness.  04/22/2020 This patient returns to establish in the health center after being seen in the mobile clinic previously early August.  Patient has history of IUD in place with ovarian cyst and uterine fibroids and is planning surgery per gynecology upcoming.  She still smoking 3 cigarettes a day.  She has quite a bit of stress and anxiety with this.  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  She notes slight increased wheezing with the weather does have history of asthma and is needing refills on her albuterol inhaler.  She does note snoring and hypersomnolence.  She is never been studied for sleep apnea.  She does not have any insurance at this time making a sleep evaluation and treatment difficult  06/23/2020 Hypertension Hypertension not well controlled at this time patient with side effects from losartan HCT  Plan is to begin amlodipine 10 mg daily and hydrochlorothiazide separately 25 mg daily and discontinue the losartan component  Follow-up thyroid panel lipid  panel and metabolic panel  Reeducated patient on hypertensive diet on AVS  Asthma Mild intermittent asthma we will give refill on albuterol  Ovarian cyst Ovarian cyst fibroid uterus and IUD in place  Management per gynecology  Patient is medically cleared for planned surgery  Obesity (BMI 35.0-39.9 without comorbidity) Obesity with BMI of 39.8  Diet and exercise plan was given to the patient  Snoring Likely has obstructive sleep apnea will need to wait on evaluation at a later date  Tobacco use     Current smoking consumption amount: 3 cigarettes a day   Dicsussion on advise to quit smoking and smoking impacts: Cardiovascular impacts   Patient's willingness to quit: Willing to quit   Methods to quit smoking discussed: Behavioral modification medications   Medication management of smoking session drugs discussed: Nicotine replacement   Resources provided:  AVS    Setting quit date 1 month   Follow-up arranged 2 months   Time spent counseling the patient: 5 minutes    Diagnoses and all orders for this visit:  Essential hypertension -     CBC with Differential/Platelet -     Comprehensive metabolic panel -     Thyroid Panel With TSH -     Lipid panel  Mild intermittent asthma without complication -     CBC with Differential/Platelet  Cyst of right ovary  Obesity (BMI 35.0-39.9 without comorbidity)  Snoring  Tobacco use  Other orders -     hydrochlorothiazide (HYDRODIURIL) 25 MG tablet; Take 1  tablet (25 mg total) by mouth daily. -     amLODipine (NORVASC) 10 MG tablet; Take 1 tablet (10 mg total) by mouth daily. -     albuterol (VENTOLIN HFA) 108 (90 Base) MCG/ACT inhaler; Inhale 1-2 puffs into the lungs every 6 (six) hours as needed for wheezing or shortness of breath. -     nicotine polacrilex (NICORETTE MINI) 4 MG lozenge; Take one two times a day as needed to quit smoking     Past Medical History:   Diagnosis Date  . Asthma    albuterol  . Hypertension   . Obesity      Family History  Problem Relation Age of Onset  . Heart attack Mother   . Diabetes Father   . Hypertension Father   . High Cholesterol Father   . Breast cancer Sister   . HIV Sister      Social History   Socioeconomic History  . Marital status: Legally Separated    Spouse name: Not on file  . Number of children: Not on file  . Years of education: Not on file  . Highest education level: Not on file  Occupational History  . Not on file  Tobacco Use  . Smoking status: Former Smoker    Packs/day: 0.15    Types: Cigarettes    Quit date: 05/07/2020    Years since quitting: 0.1  . Smokeless tobacco: Never Used  . Tobacco comment: 3 cigarettes/2 wks  Substance and Sexual Activity  . Alcohol use: Yes    Alcohol/week: 2.0 standard drinks    Types: 2 Glasses of wine per week    Comment: rarely  . Drug use: Yes    Types: Marijuana    Comment: sometimes   . Sexual activity: Yes    Birth control/protection: I.U.D., Surgical  Other Topics Concern  . Not on file  Social History Narrative  . Not on file   Social Determinants of Health   Financial Resource Strain:   . Difficulty of Paying Living Expenses: Not on file  Food Insecurity: Food Insecurity Present  . Worried About Charity fundraiser in the Last Year: Sometimes true  . Ran Out of Food in the Last Year: Sometimes true  Transportation Needs: No Transportation Needs  . Lack of Transportation (Medical): No  . Lack of Transportation (Non-Medical): No  Physical Activity:   . Days of Exercise per Week: Not on file  . Minutes of Exercise per Session: Not on file  Stress:   . Feeling of Stress : Not on file  Social Connections:   . Frequency of Communication with Friends and Family: Not on file  . Frequency of Social Gatherings with Friends and Family: Not on file  . Attends Religious Services: Not on file  . Active Member of Clubs or  Organizations: Not on file  . Attends Archivist Meetings: Not on file  . Marital Status: Not on file  Intimate Partner Violence:   . Fear of Current or Ex-Partner: Not on file  . Emotionally Abused: Not on file  . Physically Abused: Not on file  . Sexually Abused: Not on file     Allergies  Allergen Reactions  . Sulfa Antibiotics Anaphylaxis, Hives, Swelling and Rash  . Aspirin Hives  . Erythromycin Other (See Comments)    Had a seizure.      Outpatient Medications Prior to Visit  Medication Sig Dispense Refill  . albuterol (VENTOLIN HFA) 108 (90 Base) MCG/ACT inhaler  Inhale 1-2 puffs into the lungs every 6 (six) hours as needed for wheezing or shortness of breath. 18 g 1  . amLODipine (NORVASC) 10 MG tablet Take 1 tablet (10 mg total) by mouth daily. 90 tablet 1  . hydrochlorothiazide (HYDRODIURIL) 25 MG tablet Take 1 tablet (25 mg total) by mouth daily. 90 tablet 1  . ibuprofen (ADVIL) 600 MG tablet Take 1 tablet (600 mg total) by mouth every 6 (six) hours as needed for moderate pain. 30 tablet 2  . medroxyPROGESTERone (PROVERA) 10 MG tablet Take 1 tablet (10 mg total) by mouth daily. Use for ten days (Patient not taking: Reported on 06/22/2020) 10 tablet 1  . nicotine polacrilex (NICORETTE MINI) 4 MG lozenge Take one two times a day as needed to quit smoking 50 tablet 4   No facility-administered medications prior to visit.    Review of Systems  Constitutional: Positive for unexpected weight change.  HENT: Negative.   Eyes: Positive for visual disturbance.  Respiratory: Negative for cough, shortness of breath, wheezing and stridor.        Snores at night  Cardiovascular: Negative for chest pain.  Gastrointestinal: Positive for abdominal pain. Negative for nausea and vomiting.  Endocrine: Negative.   Genitourinary: Negative.   Neurological: Positive for dizziness and headaches.  Hematological: Negative.   Psychiatric/Behavioral: Negative for self-injury and  suicidal ideas.       Objective:   Physical Exam There were no vitals filed for this visit.  Gen: Pleasant, obese, in no distress,  normal affect  ENT: No lesions,  mouth clear,  oropharynx clear, no postnasal drip  Neck: No JVD, no TMG, no carotid bruits  Lungs: No use of accessory muscles, no dullness to percussion, clear without rales or rhonchi  Cardiovascular: RRR, heart sounds normal, no murmur or gallops, no peripheral edema  Abdomen: soft and NT, no HSM,  BS normal  Musculoskeletal: No deformities, no cyanosis or clubbing  Neuro: alert, non focal  Skin: Warm, no lesions or rashes       Assessment & Plan:  I personally reviewed all images and lab data in the [facility] system as well as any outside material available during this office visit and agree with the  radiology impressions.   No problem-specific Assessment & Plan notes found for this encounter.   There are no diagnoses linked to this encounter.

## 2020-06-24 LAB — CYTOLOGY - PAP
Comment: NEGATIVE
Diagnosis: NEGATIVE
High risk HPV: NEGATIVE

## 2020-06-24 LAB — SURGICAL PATHOLOGY

## 2020-06-30 ENCOUNTER — Encounter: Payer: Self-pay | Admitting: General Practice

## 2020-07-20 ENCOUNTER — Ambulatory Visit: Payer: Self-pay | Admitting: Obstetrics and Gynecology

## 2020-07-30 ENCOUNTER — Ambulatory Visit: Payer: Self-pay | Admitting: Obstetrics and Gynecology

## 2020-08-04 ENCOUNTER — Ambulatory Visit: Payer: No Typology Code available for payment source | Admitting: *Deleted

## 2020-08-04 ENCOUNTER — Ambulatory Visit
Admission: RE | Admit: 2020-08-04 | Discharge: 2020-08-04 | Disposition: A | Discharge status: Home / Self Care | Payer: Self-pay | Source: Ambulatory Visit | Attending: Obstetrics and Gynecology | Admitting: Obstetrics and Gynecology

## 2020-08-04 ENCOUNTER — Other Ambulatory Visit: Payer: Self-pay

## 2020-08-04 VITALS — BP 140/100 | Wt 263.7 lb

## 2020-08-04 DIAGNOSIS — Z1231 Encounter for screening mammogram for malignant neoplasm of breast: Secondary | ICD-10-CM

## 2020-08-04 DIAGNOSIS — Z1239 Encounter for other screening for malignant neoplasm of breast: Secondary | ICD-10-CM

## 2020-08-04 NOTE — Progress Notes (Signed)
Ms. Ashiya Kinkead is a 49 y.o. female who presents to [facility] clinic today with no complaints.    Pap Smear: Pap smear not completed today. Last Pap smear was 06/22/2020 at [facility] for [facility] and was normal with negative HPV. Per patient has no history of an abnormal Pap smear. Last Pap smear result is available in Epic.   Physical exam: Breasts Breasts symmetrical. No skin abnormalities bilateral breasts. No nipple retraction bilateral breasts. No nipple discharge bilateral breasts. No lymphadenopathy. No lumps palpated bilateral breasts. No complaints of pain or tenderness on exam.       Pelvic/Bimanual Pap is not indicated today per BCCCP guidelines.    Smoking History: Patient is a current smoker. Discussed smoking cessation with patient. Referred to the [facility] Quitline and gave resources to the free smoking cessation classes at [facility].   Patient Navigation: Patient education provided. Access to services provided for patient through BCCCP program.    Breast and Cervical Cancer Risk Assessment: Patient has family history of her sister having breast cancer. Patient has no known genetic mutations or history of radiation treatment to the chest before age 53. Patient does not have history of cervical dysplasia, immunocompromised, or DES exposure in-utero.  Risk Assessment    Risk Scores      08/04/2020   Last edited by: Royston Bake, CMA   5-year risk: 1.6 %   Lifetime risk: 14.1 %          A: BCCCP exam without pap smear No complaints.  P: Referred patient to the [facility] for a screening mammogram. Appointment scheduled Tuesday, August 04, 2020 at 1050.  Loletta Parish, RN 08/04/2020 9:41 AM

## 2020-08-04 NOTE — Patient Instructions (Signed)
Explained breast self awareness with [name]. Patient did not need a Pap smear today due to last Pap smear and HPV typing was 06/22/2020. Let her know BCCCP will cover Pap smears and HPV typing every 5 years unless has a history of abnormal Pap smears. Referred patient to the [facility] for a screening mammogram. Appointment scheduled Tuesday, August 04, 2020 at 1050. Patient aware of appointment and will be there. Let patient know the [facility] will follow up with her within the next couple weeks with results of her mammogram by letter or phone. Discussed smoking cessation with patient. Referred to the [facility] Quitline and gave resources to the free smoking cessation classes at [facility]. Vicci Reder verbalized understanding.  Aubriauna Riner, Arvil Chaco, RN 9:41 AM

## 2020-09-14 ENCOUNTER — Telehealth: Payer: Self-pay | Admitting: Obstetrics and Gynecology

## 2020-09-14 NOTE — Telephone Encounter (Signed)
Pt states that she had cyst and IUD removed in Sept 2021 and IUD replaced in Nov 2021 and states that she missed f/u appt due to she started new job, but states that she has severe bleeding since IUD replaced, states constant everyday. Pt request a call back ASAP.thanks

## 2020-09-16 ENCOUNTER — Telehealth: Payer: Self-pay | Admitting: Family Medicine

## 2020-09-16 DIAGNOSIS — Z8742 Personal history of other diseases of the female genital tract: Secondary | ICD-10-CM

## 2020-09-16 DIAGNOSIS — Z975 Presence of (intrauterine) contraceptive device: Secondary | ICD-10-CM

## 2020-09-16 NOTE — Telephone Encounter (Signed)
severe bleeding since IUD replaced Nov 2021, states constant everyday. Pt request a call back ASAP. Pt states this is second call ( also called 09-14-20) .thanks

## 2020-09-17 ENCOUNTER — Telehealth: Payer: Self-pay

## 2020-09-17 NOTE — Telephone Encounter (Signed)
Call returned to pt and discussed her concerns. She stated that she has had heavy bleeding every P[place] since insertion of IUD on 06/22/20. She is using both tampons and pads - saturating every 1-2 hours including clots. She is feeling weak and tired. I advised pt that I will speak with Dr. Elgie Congo and call her back with his recommendations.   1352 After speaking with Dr. Elgie Congo, I called pt and informed her of the following plan of care:    1.Take Ibuprofen 800 mg every 8hrs x5 days. We would like her to call office or send Mychart message after the 5 days to let us know if the bleeding has changed. 2. Ultrasound to check IUD placement on 2/8 @ 11:00. 3. Follow up appointment in office in 3 weeks - pt will be notified of appt via [place].    Pt voiced understanding and agreed to plan of care.

## 2020-09-17 NOTE — Telephone Encounter (Signed)
Called Pt back regarding her reporting severe bleeding after her IUD was replaced, no answer left VM stating if she's still having severe bleeding that she needs to go to ED, otherwise to call back & make an appointment.

## 2020-09-29 ENCOUNTER — Other Ambulatory Visit: Payer: Self-pay

## 2020-09-29 ENCOUNTER — Ambulatory Visit
Admission: RE | Admit: 2020-09-29 | Discharge: 2020-09-29 | Disposition: A | Discharge status: Home / Self Care | Payer: No Typology Code available for payment source | Source: Ambulatory Visit | Attending: Obstetrics and Gynecology | Admitting: Obstetrics and Gynecology

## 2020-09-29 DIAGNOSIS — Z8742 Personal history of other diseases of the female genital tract: Secondary | ICD-10-CM

## 2020-09-29 DIAGNOSIS — Z975 Presence of (intrauterine) contraceptive device: Secondary | ICD-10-CM | POA: Insufficient documentation

## 2020-09-30 ENCOUNTER — Telehealth: Payer: Self-pay | Admitting: *Deleted

## 2020-09-30 NOTE — Telephone Encounter (Signed)
-----   Message from [name], MD sent at 09/30/2020  1:39 PM EST ----- IUD not well seen.  Pt did not come in for string check after recent placement.  Recommend office visit for string check and to discuss options for further treatment.  Enlarging central fibroid noted

## 2020-09-30 NOTE — Telephone Encounter (Signed)
I called Angela Burton and notified her of results and recommendations. She has MD appointment already scheduled for 10/07/20 with Dr. Elgie Congo. I encouraged her to keep that appointment for string check and to discuss options. She states she will be there. Aleenah Homen,RN

## 2020-10-06 ENCOUNTER — Telehealth: Payer: Self-pay | Admitting: Obstetrics and Gynecology

## 2020-10-06 NOTE — Telephone Encounter (Signed)
Attempted to reach patient about her rescheduled appointment. Left a message Dr. Elgie Congo is out sick, and we have rescheduled your appointment.

## 2020-10-07 ENCOUNTER — Ambulatory Visit: Payer: No Typology Code available for payment source | Admitting: Family Medicine

## 2020-10-22 ENCOUNTER — Ambulatory Visit: Payer: No Typology Code available for payment source | Admitting: Obstetrics and Gynecology

## 2020-10-22 ENCOUNTER — Ambulatory Visit (INDEPENDENT_AMBULATORY_CARE_PROVIDER_SITE_OTHER): Payer: Self-pay | Admitting: Obstetrics and Gynecology

## 2020-10-22 ENCOUNTER — Other Ambulatory Visit: Payer: Self-pay

## 2020-10-22 ENCOUNTER — Telehealth: Payer: Self-pay | Admitting: *Deleted

## 2020-10-22 ENCOUNTER — Encounter: Payer: Self-pay | Admitting: Obstetrics and Gynecology

## 2020-10-22 VITALS — BP 137/88 | HR 70 | Ht 66.0 in | Wt 258.6 lb

## 2020-10-22 DIAGNOSIS — D251 Intramural leiomyoma of uterus: Secondary | ICD-10-CM

## 2020-10-22 DIAGNOSIS — T8332XA Displacement of intrauterine contraceptive device, initial encounter: Secondary | ICD-10-CM | POA: Insufficient documentation

## 2020-10-22 DIAGNOSIS — N926 Irregular menstruation, unspecified: Secondary | ICD-10-CM

## 2020-10-22 DIAGNOSIS — D252 Subserosal leiomyoma of uterus: Secondary | ICD-10-CM

## 2020-10-22 NOTE — Telephone Encounter (Signed)
Called to schedule abdominal US and was told does not have to have appointment; patient can walk in for appointment.  Called [place] and left message on her voicemail and to call if questions. Draya Felker,RN

## 2020-10-22 NOTE — Patient Instructions (Signed)
Uterine Fibroids  Uterine fibroids, also called leiomyomas, are noncancerous (benign) tumors that can grow in the uterus. They can cause heavy menstrual bleeding and pain. Fibroids may also grow in the fallopian tubes, cervix, or tissues (ligaments) near the uterus. You may have one or many fibroids. Fibroids vary in size, weight, and where they grow in the uterus. Some can become quite large. Most fibroids do not require medical treatment. What are the causes? The cause of this condition is not known. What increases the risk? You are more likely to develop this condition if you:  Are in your 30s or 40s and have not gone through menopause.  Have a family history of this condition.  Are of African American descent.  Started your menstrual period at age 102 or younger.  Have never given birth.  Are overweight or obese. What are the signs or symptoms? Many women do not have any symptoms. Symptoms of this condition may include:  Heavy menstrual bleeding.  Bleeding between menstrual periods.  Pain and pressure in the pelvic area, between your hip bones.  Pain during sex.  Bladder problems, such as needing to urinate right away or more often than usual.  Inability to have children (infertility).  Failure to carry pregnancy to term (miscarriage). How is this diagnosed? This condition may be diagnosed based on:  Your symptoms and medical history.  A physical exam.  A pelvic exam that includes feeling for any tumors.  Imaging tests, such as ultrasound or MRI. How is this treated? Treatment for this condition may include follow-up visits with your health care provider to monitor your fibroids for any changes. Other treatment may include:  Medicines, such as: ? Medicines to relieve pain, including aspirin and NSAIDs, such as ibuprofen or naproxen. ? Hormone therapy. Treatment may be given as a pill or an injection, or it may be inserted into the uterus using an intrauterine  device (IUD).  Surgery that would do one of the following: ? Remove the fibroids (myomectomy). This may be recommended if fibroids affect your fertility and you want to become pregnant. ? Remove the uterus (hysterectomy). ? Block the blood supply to the fibroids (uterine artery embolization). This can cause them to shrink and die. Follow these instructions at home: Medicines  Take over-the-counter and prescription medicines only as told by your health care provider.  Ask your health care provider if you should take iron pills or eat more iron-rich foods, such as dark green, leafy vegetables. Heavy menstrual bleeding can cause low iron levels. Managing pain If directed, apply heat to your back or abdomen to reduce pain. Use the heat source that your health care provider recommends, such as a moist heat pack or a heating pad. To apply heat:  Place a towel between your skin and the heat source.  Leave the heat on for 20-30 minutes.  Remove the heat if your skin turns bright red. This is especially important if you are unable to feel pain, heat, or cold. You may have a greater risk of getting burned.   General instructions  Pay close attention to your menstrual cycle. Tell your health care provider about any changes, such as: ? Heavier bleeding that requires you to change your pads or tampons more than usual. ? A change in the number of days that your menstrual period lasts. ? A change in symptoms that come with your menstrual period, such as back pain or cramps in your abdomen.  Keep all follow-up visits. This is  important, especially if your fibroids need to be monitored for any changes. Contact a health care provider if you:  Have pelvic pain, back pain, or cramps in your abdomen that do not get better with medicine or heat.  Develop new bleeding between menstrual periods.  Have increased bleeding during or between menstrual periods.  Feel more tired or weak than usual.  Feel  light-headed. Get help right away if you:  Faint.  Have pelvic pain that suddenly gets worse.  Have severe vaginal bleeding that soaks a tampon or pad in 30 minutes or less. Summary  Uterine fibroids are noncancerous (benign) tumors that can develop in the uterus.  The exact cause of this condition is not known.  Most fibroids do not require medical treatment unless they affect your ability to have children (fertility).  Contact a health care provider if you have pelvic pain, back pain, or cramps in your abdomen that do not get better with medicines.  Get help right away if you faint, have pelvic pain that suddenly gets worse, or have severe vaginal bleeding. This information is not intended to replace advice given to you by your health care provider. Make sure you discuss any questions you have with your health care provider. Document Revised: 03/10/2020 Document Reviewed: 03/10/2020 Elsevier Patient Education  [place].

## 2020-10-22 NOTE — Progress Notes (Signed)
   Subjective:    Patient ID: Angela Burton, female    DOB: 15-Feb-1971, 50 y.o.   MRN: [id number]  HPI Pt seen for evaluation of IUD and irregular bleeding.  Pt did not follow up for string check post insertion.  She notes bleeding improved with scheduled NSAIDs, but pt desires definitive therapy.   Review of Systems     Objective:   Physical Exam Genitourinary:    Comments: SVE:  IUD strings not seen and could not be detected with cytobrush.  No active bleeding currently.   Vitals:   10/22/20 1110  BP: 137/88  Pulse: 70   CLINICAL DATA:  Initial evaluation for heavy vaginal bleeding. Evaluate IUD placement.   EXAM: ULTRASOUND PELVIS TRANSVAGINAL   TECHNIQUE: Transvaginal ultrasound examination of the pelvis was performed including evaluation of the uterus, ovaries, adnexal regions, and pelvic cul-de-sac.   COMPARISON:  Prior ultrasound from 02/14/2020   FINDINGS: Uterus   Measurements: 12.2 x 10.4 x 9.6 cm = volume: 631.0 mL. Uterus is anteverted. Large intramural fibroid positioned at the central aspect of the uterus measures 9.0 x 7.6 x 7.2 cm. Additional subserosal fibroid at the posterior uterine body measures 2.7 x 3.3 x 2.5 cm   Endometrium   Endometrial stripe obscured by overlying fibroid. Previously seen IUD is not definitely visualized on today's exam, raising the possibility for interval malpositioning.   Right ovary   Not visualized.  No adnexal mass.   Left ovary   Measurements: 3.6 x 2.4 x 2.5 cm = volume: 11.6 mL. Normal appearance/no adnexal mass.   Other findings:  No abnormal free fluid   IMPRESSION: 1. Previously seen IUD not definitely visualized on today's exam, raising the possibility for interval malpositioning. Correlation with x-ray of the pelvis suggested for further evaluation. 2. Enlarged fibroid uterus, with the dominant fibroid now measuring up to 9 cm, increased in size from previous. 3. Endometrial stripe obscured by  overlying fibroid, and not assessed on this exam. Further evaluation with dedicated sonohysterography and/or pelvic MRI could be performed for further evaluation as warranted. 4. Normal sonographic appearance of the left ovary, with nonvisualization of the right ovary. No adnexal mass or free fluid.     Electronically Signed   By: Jeannine Boga M.D.   On: 09/29/2020 19:06      Assessment & Plan:   1. Intrauterine contraceptive device threads lost, initial encounter KUB/ abdominal xray scheduled to eval for perforation.  Due not believe pt had perforation due to ease of procedure.  Expulsion could have possible with heavier bleeding - DG Abd 1 View; Future  2. Irregular menses Pt desires definitive treatment.  Discussed Accessa procedure, but pt wants definitive treatment.  Discussed with patient possible hysterectomy TVH vs TAH.  Will schedule after return of abdominal scan.  3. Intramural and subserous leiomyoma of uterus Large central fibroid  Pt will need financial assistance prior to scheduling hysterectomy.  [name], MD Faculty Attending, [facility]

## 2020-10-22 NOTE — Progress Notes (Signed)
Addendum, food insecurity noted - offered food market- pt. Declines- states not issue right now. Saul Fabiano,RN

## 2020-11-10 ENCOUNTER — Telehealth: Payer: Self-pay | Admitting: Obstetrics and Gynecology

## 2020-11-10 DIAGNOSIS — R1084 Generalized abdominal pain: Secondary | ICD-10-CM

## 2020-11-10 DIAGNOSIS — T8332XD Displacement of intrauterine contraceptive device, subsequent encounter: Secondary | ICD-10-CM

## 2020-11-10 NOTE — Telephone Encounter (Signed)
Patient called requesting a [place] to check her IUD

## 2020-11-10 NOTE — Telephone Encounter (Signed)
Called pt. Per chart review patient needs to have abdominal XR per Elgie Congo, MD. The [facility] centralized scheduling who states pt may walk in for this exam. Explained this to pt who verbalizes understanding. I explained that it appears the abdominal CT does not need to be scheduled at this time and that it was Dr. Gordy Councilman preference pt receive abd XR first.

## 2020-11-27 ENCOUNTER — Other Ambulatory Visit: Payer: Self-pay | Admitting: Critical Care Medicine

## 2020-12-26 ENCOUNTER — Other Ambulatory Visit: Payer: Self-pay | Admitting: Critical Care Medicine

## 2020-12-26 NOTE — Telephone Encounter (Signed)
Last RF 11/27/20 #30 (wa a courtesy RF) Sent pt message on MyChart to call and make appt.

## 2021-02-04 ENCOUNTER — Telehealth: Payer: Self-pay | Admitting: Critical Care Medicine

## 2021-02-04 ENCOUNTER — Encounter: Payer: Self-pay | Admitting: Physician Assistant

## 2021-02-04 ENCOUNTER — Other Ambulatory Visit: Payer: Self-pay

## 2021-02-04 ENCOUNTER — Ambulatory Visit: Payer: Self-pay | Attending: Physician Assistant | Admitting: Physician Assistant

## 2021-02-04 DIAGNOSIS — J4520 Mild intermittent asthma, uncomplicated: Secondary | ICD-10-CM

## 2021-02-04 DIAGNOSIS — Z131 Encounter for screening for diabetes mellitus: Secondary | ICD-10-CM

## 2021-02-04 DIAGNOSIS — I10 Essential (primary) hypertension: Secondary | ICD-10-CM

## 2021-02-04 DIAGNOSIS — Z13220 Encounter for screening for lipoid disorders: Secondary | ICD-10-CM

## 2021-02-04 MED ORDER — ALBUTEROL SULFATE (2.5 MG/3ML) 0.083% IN NEBU: 2.5000 mg | INHALATION_SOLUTION | Freq: Four times a day (QID) | RESPIRATORY_TRACT | 1 refills | Status: DC | PRN

## 2021-02-04 MED ORDER — HYDROCHLOROTHIAZIDE 25 MG PO TABS: ORAL_TABLET | ORAL | 4 refills | Status: DC

## 2021-02-04 MED ORDER — ALBUTEROL SULFATE HFA 108 (90 BASE) MCG/ACT IN AERS: 1.0000 | INHALATION_SPRAY | Freq: Four times a day (QID) | RESPIRATORY_TRACT | 1 refills | Status: DC | PRN

## 2021-02-04 MED ORDER — PREDNISONE 10 MG PO TABS: ORAL_TABLET | ORAL | 0 refills | Status: DC

## 2021-02-04 MED ORDER — AMLODIPINE BESYLATE 10 MG PO TABS: 10.0000 mg | ORAL_TABLET | Freq: Every day | ORAL | 1 refills | Status: DC

## 2021-02-04 NOTE — Telephone Encounter (Signed)
Called pt to schedule next F.U. appointment in 2-3 months from today. Pt also needs to come in to the Lab for a couple of tests. LVM to CB to schedule this appt.

## 2021-02-04 NOTE — Telephone Encounter (Signed)
-----   Message from Argentina Donovan, [place] sent at 02/04/2021 11:25 AM EDT ----- See PCP in about 2-3 months;  sooner if needed

## 2021-02-04 NOTE — Progress Notes (Signed)
Virtual Visit via Video Note  I connected with Angela Burton on 02/04/21 at 11:10 AM EDT by a video enabled telemedicine application and verified that I am speaking with the correct person using two identifiers.  Location: Patient: home Provider: [facility] office   I discussed the limitations of evaluation and management by telemedicine and the availability of in person appointments. The patient expressed understanding and agreed to proceed.  History of Present Illness:  doing well.  She has been having increased wheezing and allergies and is requesting a course of prednisone. She has not had to have prednisone in a while.  She checks her BP daily at home and numbers have been 120-160/80-100(only high when upset or didn't take meds)  this morning it was 137/90 just before the [facility] visit.  She denies HA/CP/dizziness.     Observations/Objective: NAD.  Breathing is without labor.  Has on make-up/appears healthy.  A&Ox3   Assessment and Plan: 1. Mild intermittent asthma without complication - predniSONE (DELTASONE) 10 MG tablet; 6,5,4,3,2,1 take each days dose as needed with food.  Dispense: 21 tablet; Refill: 0 - albuterol (PROVENTIL) (2.5 MG/3ML) 0.083% nebulizer solution; Take 3 mLs (2.5 mg total) by nebulization every 6 (six) hours as needed for wheezing or shortness of breath.  Dispense: 150 mL; Refill: 1 - albuterol (VENTOLIN HFA) 108 (90 Base) MCG/ACT inhaler; Inhale 1-2 puffs into the lungs every 6 (six) hours as needed for wheezing or shortness of breath.  Dispense: 18 g; Refill: 1  2. Essential hypertension ~suboptimal control.  Continue daily BP and bring to next OV - hydrochlorothiazide (HYDRODIURIL) 25 MG tablet; TAKE 1 TABLET(25 MG) BY MOUTH DAILY  Dispense: 30 tablet; Refill: 4 - amLODipine (NORVASC) 10 MG tablet; Take 1 tablet (10 mg total) by mouth daily.  Dispense: 90 tablet; Refill: 1 - Comprehensive metabolic panel; Future - CBC with Differential/Platelet; Future  3.  Screening for diabetes mellitus I have had a lengthy discussion and provided education about insulin resistance and the intake of too much sugar/refined carbohydrates.  I have advised the patient to work at a goal of eliminating sugary drinks, candy, desserts, sweets, refined sugars, processed foods, and white carbohydrates.  The patient expresses understanding.   - Hemoglobin A1c; Future  4. Screening, lipid - Lipid panel; Future   She has agreed to come for labs within 3 weeks.   Follow Up Instructions: See PCP in about 2-3 months;  sooner if needed   I discussed the assessment and treatment plan with the patient. The patient was provided an opportunity to ask questions and all were answered. The patient agreed with the plan and demonstrated an understanding of the instructions.   The patient was advised to call back or seek an in-person evaluation if the symptoms worsen or if the condition fails to improve as anticipated.  I provided 14 minutes of non-face-to-face time during this encounter.   Freeman Caldron, PA-C  Patient ID: Angela Burton, female   DOB: Sep 27, 1970, 50 y.o.   MRN: [id number]

## 2021-03-02 ENCOUNTER — Telehealth: Payer: Self-pay | Admitting: Critical Care Medicine

## 2021-03-02 ENCOUNTER — Ambulatory Visit: Payer: Self-pay

## 2021-03-02 DIAGNOSIS — J4520 Mild intermittent asthma, uncomplicated: Secondary | ICD-10-CM

## 2021-03-02 MED ORDER — PREDNISONE 10 MG PO TABS: ORAL_TABLET | ORAL | 0 refills | Status: DC

## 2021-03-02 NOTE — Telephone Encounter (Signed)
Pt is requesting refill of prednisone for he asthma.

## 2021-03-02 NOTE — Telephone Encounter (Signed)
Please schedule patient an appointment with Dr.Wright in August.

## 2021-03-02 NOTE — Telephone Encounter (Signed)
Pred refilled  Needs OV in August

## 2021-03-02 NOTE — Telephone Encounter (Signed)
Pt. Reports her asthma started "acting up last Friday and I was out in the rain for a funeral this weekend." Has cough, shortness of breath and wheezing.Using her nebulizer. Used her rescue inhaler 6 times yesterday. Requests a visit or Prednisone sent to pharmacy until she can be seen. Please advise pt.

## 2021-03-02 NOTE — Telephone Encounter (Signed)
Answer Assessment - Initial Assessment Questions 1. RESPIRATORY STATUS: "Describe your breathing?" (e.g., wheezing, shortness of breath, unable to speak, severe coughing)      Shortness of breath, wheezing 2. ONSET: "When did this asthma attack begin?"      Friday 3. TRIGGER: "What do you think triggered this attack?" (e.g., URI, exposure to pollen or other allergen, tobacco smoke)      Out in rain 4. PEAK EXPIRATORY FLOW RATE (PEFR): "Do you use a peak flow meter?" If Yes, ask: "What's the current peak flow? What's your personal best peak flow?"      No 5. SEVERITY: "How bad is this attack?"    - MILD: No SOB at rest, mild SOB with walking, speaks normally in sentences, can lie down, no retractions, pulse < 100. (GREEN Zone: PEFR 80-100%)   - MODERATE: SOB at rest, SOB with minimal exertion and prefers to sit, cannot lie down flat, speaks in phrases, mild retractions, audible wheezing, pulse 100-120. (YELLOW Zone: PEFR 50-79%)    - SEVERE: Struggling for each breath, speaks in single words, struggling to breathe, sitting hunched forward, retractions, usually loud wheezing, sometimes minimal wheezing because of decreased air movement, pulse > 120. (RED Zone: PEFR < 50%).      Moderate 6. ASTHMA MEDICINES:  "What treatments have you given?"    - INHALED QUICK RELIEF (RESCUE): "What is your inhaled quick-relief medicine?" (e.g., albuterol, salbutamol) "Do you use an inhaler or a nebulizer?" "How frequently have you been using?"   - CONTROLLER (LONG-TERM-CONTROL): "Do you take an inhaled steroid? (e.g., Asmanex, Flovent, Pulmicort, Qvar)     Nebulizer and inhaler 7. INHALED QUICK-RELIEF TREATMENTS FOR THIS ATTACK: "What treatments have you given yourself so far?" and "How many and how often?" If using an inhaler, ask, "How many puffs?" Note: Routine treatments are 2 puffs every 4 hours as needed. Rescue treatments are 4 puffs repeated every 20 minutes, up to three times as needed.      6 ties  yesterday 8. OTHER SYMPTOMS: "Do you have any other symptoms? (e.g., chest pain, coughing up yellow sputum, fever, runny nose)     Chest tightness, cough 9. O2 SATURATION MONITOR:  "Do you use an oxygen saturation monitor (pulse oximeter) at home?" If Yes, "What is your reading (oxygen level) today?" "What is your usual oxygen saturation reading?" (e.g., 95%)     No 10. PREGNANCY: "Is there any chance you are pregnant?" "When was your last menstrual period?"       No  Protocols used: Asthma Attack-A-AH

## 2021-03-03 NOTE — Telephone Encounter (Signed)
Called patient no answer. Left voicemail to call [phone] to schedule appt with Dr. [name] in August.

## 2021-03-25 ENCOUNTER — Other Ambulatory Visit: Payer: Self-pay | Admitting: Physician Assistant

## 2021-03-25 ENCOUNTER — Other Ambulatory Visit: Payer: Self-pay

## 2021-03-25 DIAGNOSIS — I10 Essential (primary) hypertension: Secondary | ICD-10-CM

## 2021-03-25 DIAGNOSIS — J4520 Mild intermittent asthma, uncomplicated: Secondary | ICD-10-CM

## 2021-03-25 MED ORDER — ALBUTEROL SULFATE HFA 108 (90 BASE) MCG/ACT IN AERS
1.0000 | INHALATION_SPRAY | Freq: Four times a day (QID) | RESPIRATORY_TRACT | 1 refills | Status: DC | PRN
  Filled 2021-03-25: qty 18, 25d supply, fill #0
  Filled 2021-06-11: qty 18, 25d supply, fill #1

## 2021-03-25 MED ORDER — HYDROCHLOROTHIAZIDE 25 MG PO TABS
ORAL_TABLET | ORAL | 1 refills | Status: DC
  Filled 2021-03-25: qty 30, 30d supply, fill #0

## 2021-03-25 MED ORDER — ALBUTEROL SULFATE (2.5 MG/3ML) 0.083% IN NEBU
2.5000 mg | INHALATION_SOLUTION | Freq: Four times a day (QID) | RESPIRATORY_TRACT | 1 refills | Status: DC | PRN
Start: 2021-03-25 — End: 2021-06-28
  Filled 2021-03-25: qty 150, 13d supply, fill #0
  Filled 2021-06-11: qty 90, 8d supply, fill #1
  Filled 2021-06-27: qty 90, 8d supply, fill #2

## 2021-03-26 ENCOUNTER — Other Ambulatory Visit: Payer: Self-pay

## 2021-05-04 ENCOUNTER — Other Ambulatory Visit: Payer: Self-pay

## 2021-05-04 ENCOUNTER — Ambulatory Visit: Payer: Self-pay | Attending: Critical Care Medicine | Admitting: Critical Care Medicine

## 2021-05-04 ENCOUNTER — Encounter: Payer: Self-pay | Admitting: Critical Care Medicine

## 2021-05-04 VITALS — BP 150/98 | HR 66 | Ht 66.0 in | Wt 241.8 lb

## 2021-05-04 DIAGNOSIS — Z1159 Encounter for screening for other viral diseases: Secondary | ICD-10-CM

## 2021-05-04 DIAGNOSIS — I10 Essential (primary) hypertension: Secondary | ICD-10-CM

## 2021-05-04 DIAGNOSIS — Z720 Tobacco use: Secondary | ICD-10-CM

## 2021-05-04 DIAGNOSIS — Z114 Encounter for screening for human immunodeficiency virus [HIV]: Secondary | ICD-10-CM

## 2021-05-04 DIAGNOSIS — Z1211 Encounter for screening for malignant neoplasm of colon: Secondary | ICD-10-CM

## 2021-05-04 DIAGNOSIS — Z139 Encounter for screening, unspecified: Secondary | ICD-10-CM

## 2021-05-04 DIAGNOSIS — J4541 Moderate persistent asthma with (acute) exacerbation: Secondary | ICD-10-CM

## 2021-05-04 MED ORDER — CETIRIZINE HCL 10 MG PO TABS
10.0000 mg | ORAL_TABLET | Freq: Every day | ORAL | 2 refills | Status: DC
  Filled 2021-05-04 – 2021-05-11 (×2): qty 30, 30d supply, fill #0
  Filled 2021-06-11: qty 30, 30d supply, fill #1
  Filled 2021-08-03 – 2021-08-11 (×2): qty 30, 30d supply, fill #2

## 2021-05-04 MED ORDER — HYDROCHLOROTHIAZIDE 25 MG PO TABS
ORAL_TABLET | ORAL | 4 refills | Status: DC
  Filled 2021-05-04 – 2021-05-11 (×2): qty 30, 30d supply, fill #0
  Filled 2021-06-11 – 2021-08-11 (×3): qty 30, 30d supply, fill #1
  Filled 2021-09-14 – 2021-10-07 (×4): qty 30, 30d supply, fill #0
  Filled 2021-11-05 – 2021-11-15 (×2): qty 30, 30d supply, fill #1
  Filled 2021-12-22: qty 30, 30d supply, fill #2
  Filled 2022-01-19: qty 30, 30d supply, fill #3
  Filled 2022-02-14: qty 30, 30d supply, fill #4
  Filled 2022-03-14: qty 30, 30d supply, fill #5
  Filled 2022-04-12: qty 30, 30d supply, fill #6

## 2021-05-04 MED ORDER — NICOTINE 21 MG/24HR TD PT24
21.0000 mg | MEDICATED_PATCH | Freq: Every day | TRANSDERMAL | 0 refills | Status: DC
  Filled 2021-05-04 – 2021-05-11 (×2): qty 28, 28d supply, fill #0

## 2021-05-04 MED ORDER — AMLODIPINE BESYLATE 10 MG PO TABS
10.0000 mg | ORAL_TABLET | Freq: Every day | ORAL | 3 refills | Status: DC
  Filled 2021-05-04: qty 60, 60d supply, fill #0
  Filled 2021-05-11: qty 30, 30d supply, fill #0
  Filled 2021-06-11: qty 30, 30d supply, fill #1

## 2021-05-04 MED ORDER — FLUTICASONE PROPIONATE HFA 110 MCG/ACT IN AERO
2.0000 | INHALATION_SPRAY | Freq: Two times a day (BID) | RESPIRATORY_TRACT | 12 refills | Status: DC
  Filled 2021-05-04: qty 12, 30d supply, fill #0
  Filled 2021-05-11: qty 16, 30d supply, fill #0
  Filled 2021-06-11: qty 12, 30d supply, fill #1
  Filled 2021-08-03 – 2021-08-11 (×2): qty 12, 30d supply, fill #2
  Filled 2021-10-04: qty 12, 30d supply, fill #3
  Filled 2021-10-05: qty 12, 30d supply, fill #0
  Filled 2021-11-08: qty 12, 30d supply, fill #1

## 2021-05-04 NOTE — Assessment & Plan Note (Signed)
Hypertension not well controlled exacerbated by tobacco use  Will begin amlodipine 10 mg daily and hydrochlorothiazide 25 mg daily and pursue smoking cessation

## 2021-05-04 NOTE — Assessment & Plan Note (Signed)
Moderate persistent asthma we will plan Flovent 110 mcg strength 2 puffs twice daily and cetirizine 10 mg daily and smoking cessation with nicotine patch

## 2021-05-04 NOTE — Progress Notes (Signed)
Established Patient Office Visit  Subjective:  Patient ID: Angela Burton, female    DOB: [birth year]  Age: 50 y.o. MRN: [id number]  CC:  Chief Complaint  Patient presents with   Hypertension    HPI Angela Burton presents for hypertension evaluation and reestablish in the clinic.  Note the patient's not been seen since September 2021.  Patient did have a phone visit with PA [name] in June for mild asthma flare.  Patient states that she continues to have difficulty with cough and shortness of breath.  She is still smoking 3 cigarettes daily.  She does have an albuterol inhaler.  She tried the nicotine lozenges these did not help she had side effects.  On arrival blood pressure is elevated 150/98.  Patient does not have any other complaints. The patient declines a flu vaccine but will accept a pneumococcal vaccine.  She also will agree to colon cancer screening with fecal occult kit.  Past Medical History:  Diagnosis Date   Asthma    albuterol   Hypertension    Obesity     Past Surgical History:  Procedure Laterality Date   DILATION AND CURETTAGE OF UTERUS     HYSTEROSCOPY WITH D & C N/A 05/05/2020   Procedure: DILATATION AND CURETTAGE /HYSTEROSCOPY AND IUD REMOVAL;  Surgeon: Warden Fillers, MD;  Location: [facility];  Service: Gynecology;  Laterality: N/A;   LAPAROSCOPIC OVARIAN CYSTECTOMY Right 05/05/2020   Procedure: OPERATIVE LAPAROSCOPY AND LAPAROSCOPIC OVARIAN CYSTECTOMY;  Surgeon: Warden Fillers, MD;  Location: [facility];  Service: Gynecology;  Laterality: Right;   TUBAL LIGATION      Family History  Problem Relation Age of Onset   Heart attack Mother    Diabetes Father    Hypertension Father    High Cholesterol Father    Breast cancer Sister    HIV Sister     Social History   Socioeconomic History   Marital status: Legally Separated    Spouse name: Not on file   Number of children: Not on file   Years of education: Not on  file   Highest education level: Not on file  Occupational History   Not on file  Tobacco Use   Smoking status: Some Days    Packs/day: 0.15    Types: Cigarettes   Smokeless tobacco: Never   Tobacco comments:    3 cigarettes/2 wks  Vaping Use   Vaping Use: Never used  Substance and Sexual Activity   Alcohol use: Yes    Alcohol/week: 2.0 standard drinks    Types: 2 Glasses of wine per week    Comment: rarely   Drug use: Yes    Types: Marijuana    Comment: sometimes    Sexual activity: Yes    Birth control/protection: I.U.D., Surgical  Other Topics Concern   Not on file  Social History Narrative   Not on file   Social Determinants of Health   Financial Resource Strain: Not on file  Food Insecurity: Food Insecurity Present   Worried About Running Out of Food in the Last Year: Sometimes true   Ran Out of Food in the Last Year: Sometimes true  Transportation Needs: No Transportation Needs   Lack of Transportation (Medical): No   Lack of Transportation (Non-Medical): No  Physical Activity: Not on file  Stress: Not on file  Social Connections: Not on file  Intimate Partner Violence: Not on file    Outpatient Medications Prior to Visit  Medication Sig Dispense Refill   albuterol (PROVENTIL) (2.5 MG/3ML) 0.083% nebulizer solution Take 3 mLs (2.5 mg total) by nebulization every 6 (six) hours as needed for wheezing or shortness of breath. 150 mL 1   albuterol (VENTOLIN HFA) 108 (90 Base) MCG/ACT inhaler Inhale 1-2 puffs into the lungs every 6 (six) hours as needed for wheezing or shortness of breath. 18 g 1   cetirizine (ZYRTEC) 10 MG tablet Take 10 mg by mouth daily.     hydrochlorothiazide (HYDRODIURIL) 25 MG tablet TAKE 1 TABLET(25 MG) BY MOUTH DAILY 30 tablet 1   nicotine polacrilex (NICORETTE MINI) 4 MG lozenge Take one two times a day as needed to quit smoking 50 tablet 4   amLODipine (NORVASC) 10 MG tablet Take 1 tablet (10 mg total) by mouth daily. (Patient not taking:  Reported on 05/04/2021) 90 tablet 1   ibuprofen (ADVIL) 600 MG tablet Take 1 tablet (600 mg total) by mouth every 6 (six) hours as needed for moderate pain. (Patient not taking: Reported on 05/04/2021) 30 tablet 2   ibuprofen (ADVIL) 800 MG tablet Take 800 mg by mouth 3 (three) times daily. (Patient not taking: Reported on 05/04/2021)     medroxyPROGESTERone (PROVERA) 10 MG tablet Take 1 tablet (10 mg total) by mouth daily. Use for ten days (Patient not taking: No sig reported) 10 tablet 1   predniSONE (DELTASONE) 10 MG tablet 6,5,4,3,2,1 take each days dose as needed with food. (Patient not taking: Reported on 05/04/2021) 21 tablet 0   No facility-administered medications prior to visit.    Allergies  Allergen Reactions   Sulfa Antibiotics Anaphylaxis, Hives, Swelling and Rash   Aspirin Hives   Erythromycin Other (See Comments)    Had a seizure.     ROS Review of Systems  Constitutional:  Negative for chills, diaphoresis and fever.  HENT:  Positive for postnasal drip, rhinorrhea, sinus pressure and sneezing. Negative for congestion, ear discharge, ear pain, hearing loss, nosebleeds, sore throat and tinnitus.   Eyes:  Negative for photophobia and discharge.  Respiratory:  Positive for cough, shortness of breath and wheezing. Negative for stridor.        No excess mucus  Cardiovascular:  Negative for chest pain, palpitations and leg swelling.  Gastrointestinal:  Negative for abdominal pain, blood in stool, constipation, diarrhea, nausea and vomiting.       No GERD  Endocrine: Negative for polydipsia.  Genitourinary:  Negative for dysuria, flank pain, frequency, hematuria and urgency.  Musculoskeletal:  Negative for back pain, myalgias and neck pain.  Skin:  Negative for rash.  Allergic/Immunologic: Negative for environmental allergies.  Neurological:  Negative for dizziness, tremors, seizures, weakness and headaches.  Hematological:  Does not bruise/bleed easily.  Psychiatric/Behavioral:   Negative for hallucinations and suicidal ideas. The patient is not nervous/anxious.   All other systems reviewed and are negative.    Objective:    Physical Exam Vitals reviewed.  Constitutional:      Appearance: Normal appearance. She is well-developed. She is not diaphoretic.  HENT:     Head: Normocephalic and atraumatic.     Nose: Congestion and rhinorrhea present. No nasal deformity, septal deviation or mucosal edema.     Right Sinus: No maxillary sinus tenderness or frontal sinus tenderness.     Left Sinus: No maxillary sinus tenderness or frontal sinus tenderness.     Mouth/Throat:     Mouth: Mucous membranes are moist.     Pharynx: Oropharynx is clear. No oropharyngeal exudate.  Eyes:     General: No scleral icterus.    Conjunctiva/sclera: Conjunctivae normal.     Pupils: Pupils are equal, round, and reactive to light.  Neck:     Thyroid: No thyromegaly.     Vascular: No carotid bruit or JVD.     Trachea: Trachea normal. No tracheal tenderness or tracheal deviation.  Cardiovascular:     Rate and Rhythm: Normal rate and regular rhythm.     Chest Wall: PMI is not displaced.     Pulses: Normal pulses. No decreased pulses.     Heart sounds: Normal heart sounds, S1 normal and S2 normal. Heart sounds not distant. No murmur heard. No systolic murmur is present.  No diastolic murmur is present.    No friction rub. No gallop. No S3 or S4 sounds.  Pulmonary:     Effort: Pulmonary effort is normal. No tachypnea, accessory muscle usage or respiratory distress.     Breath sounds: No stridor. Wheezing present. No decreased breath sounds, rhonchi or rales.  Chest:     Chest wall: No tenderness.  Abdominal:     General: Bowel sounds are normal. There is no distension.     Palpations: Abdomen is soft. Abdomen is not rigid.     Tenderness: There is no abdominal tenderness. There is no guarding or rebound.  Musculoskeletal:        General: Normal range of motion.     Cervical back:  Normal range of motion and neck supple. No edema, erythema or rigidity. No muscular tenderness. Normal range of motion.  Lymphadenopathy:     Head:     Right side of head: No submental or submandibular adenopathy.     Left side of head: No submental or submandibular adenopathy.     Cervical: No cervical adenopathy.  Skin:    General: Skin is warm and dry.     Coloration: Skin is not pale.     Findings: No rash.     Nails: There is no clubbing.  Neurological:     General: No focal deficit present.     Mental Status: She is alert and oriented to person, place, and time. Mental status is at baseline.     Sensory: No sensory deficit.  Psychiatric:        Mood and Affect: Mood normal.        Speech: Speech normal.        Behavior: Behavior normal.        Thought Content: Thought content normal.    BP (!) 150/98   Pulse 66   Ht $R'5\' 6"'DG$  (1.676 m)   Wt 241 lb 12.8 oz (109.7 kg)   SpO2 100%   BMI 39.03 kg/m  Wt Readings from Last 3 Encounters:  05/04/21 241 lb 12.8 oz (109.7 kg)  10/22/20 258 lb 9.6 oz (117.3 kg)  08/04/20 263 lb 11.2 oz (119.6 kg)     Health Maintenance Due  Topic Date Due   TETANUS/TDAP  Never done   COLON CANCER SCREENING ANNUAL FOBT  Never done   COVID-19 Vaccine (3 - Booster for Pfizer series) 07/07/2020    There are no preventive care reminders to display for this patient.  Lab Results  Component Value Date   TSH 1.010 04/22/2020   Lab Results  Component Value Date   WBC 6.1 05/01/2020   HGB 15.5 (H) 05/01/2020   HCT 47.9 (H) 05/01/2020   MCV 91.1 05/01/2020   PLT 248 05/01/2020   Lab Results  Component Value Date   NA 140 05/01/2020   K 4.4 05/01/2020   CO2 27 05/01/2020   GLUCOSE 106 (H) 05/01/2020   BUN 10 05/01/2020   CREATININE 0.84 05/01/2020   BILITOT 0.8 04/22/2020   ALKPHOS 91 04/22/2020   AST 14 04/22/2020   ALT 17 04/22/2020   PROT 6.7 04/22/2020   ALBUMIN 4.0 04/22/2020   CALCIUM 9.1 05/01/2020   ANIONGAP 9 05/01/2020    Lab Results  Component Value Date   CHOL 202 (H) 04/22/2020   Lab Results  Component Value Date   HDL 51 04/22/2020   Lab Results  Component Value Date   LDLCALC 128 (H) 04/22/2020   Lab Results  Component Value Date   TRIG 128 04/22/2020   Lab Results  Component Value Date   CHOLHDL 4.0 04/22/2020   No results found for: HGBA1C    Assessment & Plan:   Problem List Items Addressed This Visit       Cardiovascular and Mediastinum   Hypertension    Hypertension not well controlled exacerbated by tobacco use  Will begin amlodipine 10 mg daily and hydrochlorothiazide 25 mg daily and pursue smoking cessation      Relevant Medications   amLODipine (NORVASC) 10 MG tablet   hydrochlorothiazide (HYDRODIURIL) 25 MG tablet     Respiratory   Asthma    Moderate persistent asthma we will plan Flovent 110 mcg strength 2 puffs twice daily and cetirizine 10 mg daily and smoking cessation with nicotine patch      Relevant Medications   fluticasone (FLOVENT HFA) 110 MCG/ACT inhaler     Other   Tobacco use       Current smoking consumption amount: 3 cigarettes a day  Dicsussion on advise to quit smoking and smoking impacts: Cardiovascular impacts  Patient's willingness to quit: Willing to quit  Methods to quit smoking discussed: Behavioral modification medications  Medication management of smoking session drugs discussed: Nicotine replacement  Resources provided:  AVS   Setting quit date 1 month  Follow-up arranged 2 months   Time spent counseling the patient: 5 minutes       Other Visit Diagnoses     Essential hypertension    -  Primary   Relevant Medications   amLODipine (NORVASC) 10 MG tablet   hydrochlorothiazide (HYDRODIURIL) 25 MG tablet   Other Relevant Orders   Comprehensive metabolic panel   CBC with Differential/Platelet   Encounter for health-related screening       Relevant Orders   Hemoglobin A1c   Colon cancer screening        Relevant Orders   Fecal occult blood, imunochemical   Need for hepatitis C screening test       Relevant Orders   HCV Ab w Reflex to Quant PCR   Encounter for screening for HIV       Relevant Orders   HIV Antibody (routine testing w rflx)       Meds ordered this encounter  Medications   fluticasone (FLOVENT HFA) 110 MCG/ACT inhaler    Sig: Inhale 2 puffs into the lungs 2 (two) times daily.    Dispense:  16 g    Refill:  12    Needs PASS   nicotine (NICODERM CQ - DOSED IN MG/24 HOURS) 21 mg/24hr patch    Sig: Place 1 patch (21 mg total) onto the skin daily.    Dispense:  28 patch    Refill:  0   amLODipine (NORVASC) 10 MG  tablet    Sig: Take 1 tablet (10 mg total) by mouth daily.    Dispense:  60 tablet    Refill:  3   cetirizine (ZYRTEC) 10 MG tablet    Sig: Take 1 tablet (10 mg total) by mouth daily.    Dispense:  30 tablet    Refill:  2   hydrochlorothiazide (HYDRODIURIL) 25 MG tablet    Sig: TAKE 1 TABLET(25 MG) BY MOUTH DAILY    Dispense:  60 tablet    Refill:  4    Follow-up: Return in about 3 months (around 08/03/2021).    [name], MD

## 2021-05-04 NOTE — Patient Instructions (Signed)
Resume amlodipine 1 daily  Continue hydrochlorothiazide 1 daily  Refills on both of the above blood pressure medicines sent to our pharmacy please pick those up  Begin Flovent 2 puffs twice daily for your asthma  Pneumonia vaccine and flu vaccine was given  Screening labs today were obtained along with hepatitis C and HIV  Return to see Dr. [name] in 3 months and see [facility] clinical pharmacist in 3 weeks to follow-up on your blood pressure and smoking cessation  Begin nicotine patches 21 mg change daily to reduce tobacco use

## 2021-05-04 NOTE — Assessment & Plan Note (Signed)
  .   Current smoking consumption amount: 3 cigarettes a day  . Dicsussion on advise to quit smoking and smoking impacts: Cardiovascular impacts  . Patient's willingness to quit: Willing to quit  . Methods to quit smoking discussed: Behavioral modification medications  . Medication management of smoking session drugs discussed: Nicotine replacement  . Resources provided:  AVS   . Setting quit date 1 month  . Follow-up arranged 2 months   Time spent counseling the patient: 5 minutes

## 2021-05-05 LAB — CBC WITH DIFFERENTIAL/PLATELET
Basophils Absolute: 0.1 10*3/uL (ref 0.0–0.2)
Basos: 1 %
EOS (ABSOLUTE): 0.2 10*3/uL (ref 0.0–0.4)
Eos: 3 %
Hematocrit: 42.9 % (ref 34.0–46.6)
Hemoglobin: 13.7 g/dL (ref 11.1–15.9)
Immature Grans (Abs): 0 10*3/uL (ref 0.0–0.1)
Immature Granulocytes: 0 %
Lymphocytes Absolute: 3.4 10*3/uL — ABNORMAL HIGH (ref 0.7–3.1)
Lymphs: 50 %
MCH: 25.9 pg — ABNORMAL LOW (ref 26.6–33.0)
MCHC: 31.9 g/dL (ref 31.5–35.7)
MCV: 81 fL (ref 79–97)
Monocytes Absolute: 0.4 10*3/uL (ref 0.1–0.9)
Monocytes: 6 %
Neutrophils Absolute: 2.7 10*3/uL (ref 1.4–7.0)
Neutrophils: 40 %
Platelets: 333 10*3/uL (ref 150–450)
RBC: 5.28 x10E6/uL (ref 3.77–5.28)
RDW: 15.1 % (ref 11.7–15.4)
WBC: 6.7 10*3/uL (ref 3.4–10.8)

## 2021-05-05 LAB — COMPREHENSIVE METABOLIC PANEL
ALT: 13 IU/L (ref 0–32)
AST: 14 IU/L (ref 0–40)
Albumin/Globulin Ratio: 1.3 (ref 1.2–2.2)
Albumin: 4.1 g/dL (ref 3.8–4.8)
Alkaline Phosphatase: 101 IU/L (ref 44–121)
BUN/Creatinine Ratio: 9 (ref 9–23)
BUN: 8 mg/dL (ref 6–24)
Bilirubin Total: 0.5 mg/dL (ref 0.0–1.2)
CO2: 27 mmol/L (ref 20–29)
Calcium: 9.3 mg/dL (ref 8.7–10.2)
Chloride: 99 mmol/L (ref 96–106)
Creatinine, Ser: 0.87 mg/dL (ref 0.57–1.00)
Globulin, Total: 3.2 g/dL (ref 1.5–4.5)
Glucose: 104 mg/dL — ABNORMAL HIGH (ref 65–99)
Potassium: 3.5 mmol/L (ref 3.5–5.2)
Sodium: 139 mmol/L (ref 134–144)
Total Protein: 7.3 g/dL (ref 6.0–8.5)
eGFR: 82 mL/min/{1.73_m2} (ref >59)

## 2021-05-05 LAB — HCV AB W REFLEX TO QUANT PCR: HCV Ab: 0.1 {s_co_ratio} (ref 0.0–0.9)

## 2021-05-05 LAB — HEMOGLOBIN A1C
Est. average glucose Bld gHb Est-mCnc: 128 mg/dL
Hgb A1c MFr Bld: 6.1 % — ABNORMAL HIGH (ref 4.8–5.6)

## 2021-05-05 LAB — HCV INTERPRETATION

## 2021-05-05 LAB — HIV ANTIBODY (ROUTINE TESTING W REFLEX): HIV Screen 4th Generation wRfx: NONREACTIVE

## 2021-05-11 ENCOUNTER — Other Ambulatory Visit: Payer: Self-pay

## 2021-05-12 ENCOUNTER — Other Ambulatory Visit: Payer: Self-pay

## 2021-05-12 ENCOUNTER — Ambulatory Visit: Payer: Self-pay | Attending: Critical Care Medicine

## 2021-05-25 ENCOUNTER — Ambulatory Visit: Payer: No Typology Code available for payment source | Admitting: Pharmacist

## 2021-05-28 IMAGING — MG DIGITAL SCREENING BILAT W/ TOMO W/ CAD
8 series · 8 of 24 positions shown · non-contrast
Comparison: None.

CLINICAL DATA: Screening.

EXAM:
DIGITAL SCREENING BILATERAL MAMMOGRAM WITH TOMO AND CAD

[L MLO synth-2D]
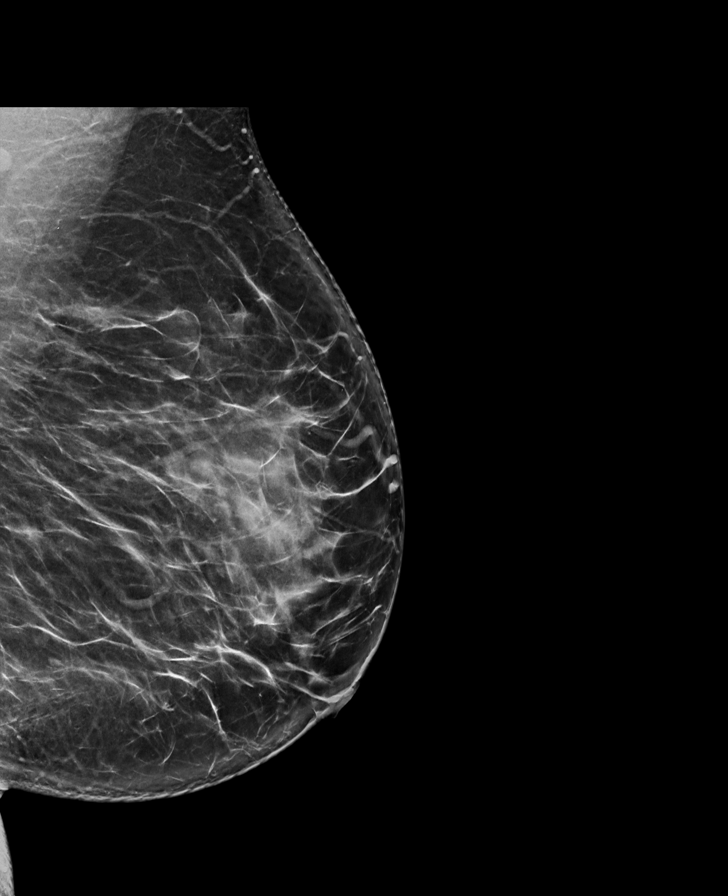

[R MLO synth-2D]
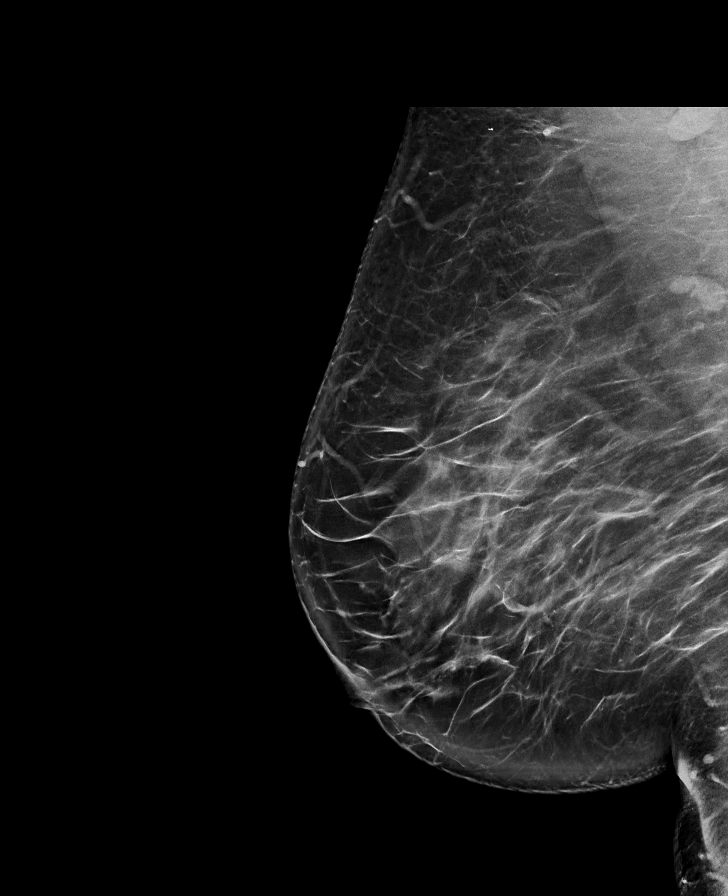

[R CC synth-2D]
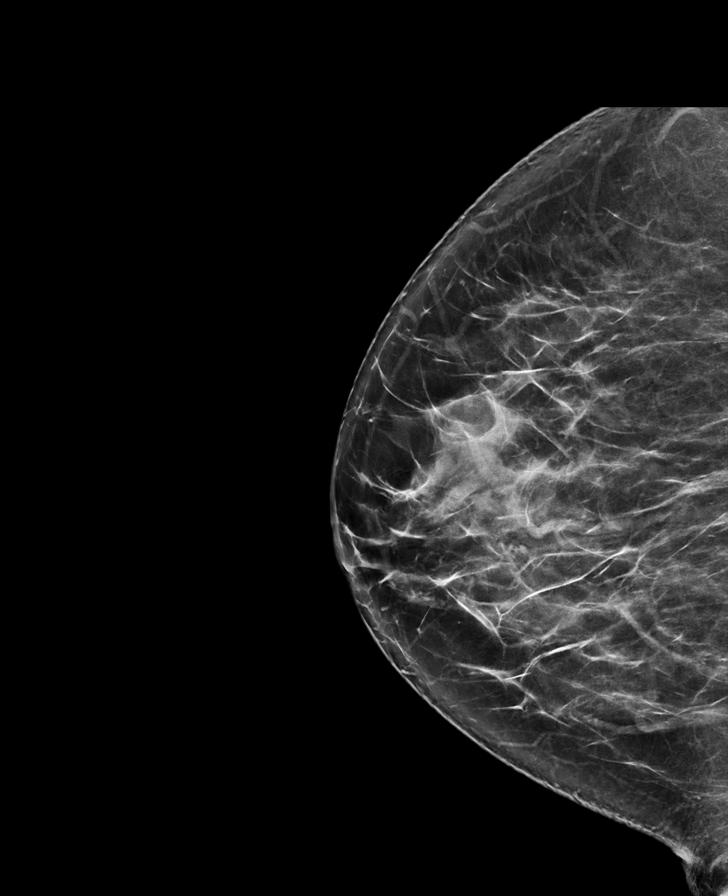

[L CC synth-2D]
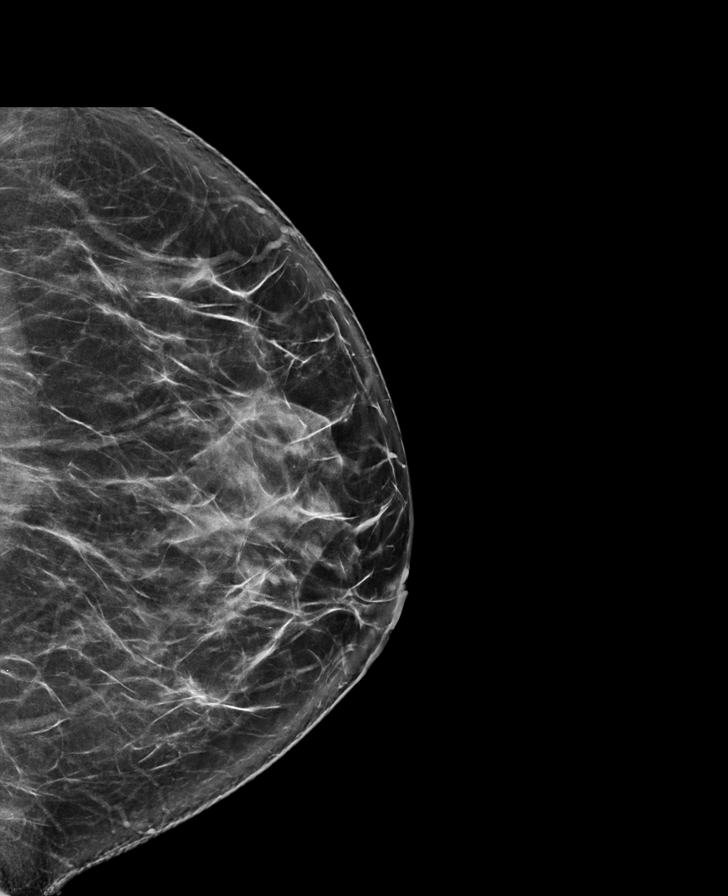

[L MLO tomo · tomo slice 44/87.0]
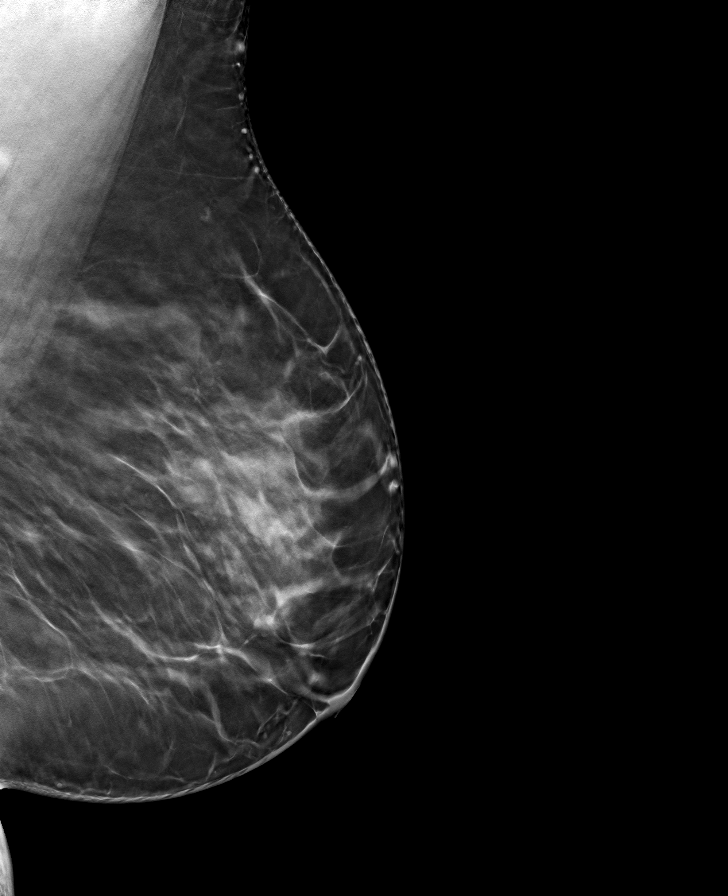

[L CC tomo · tomo slice 43/86.0]
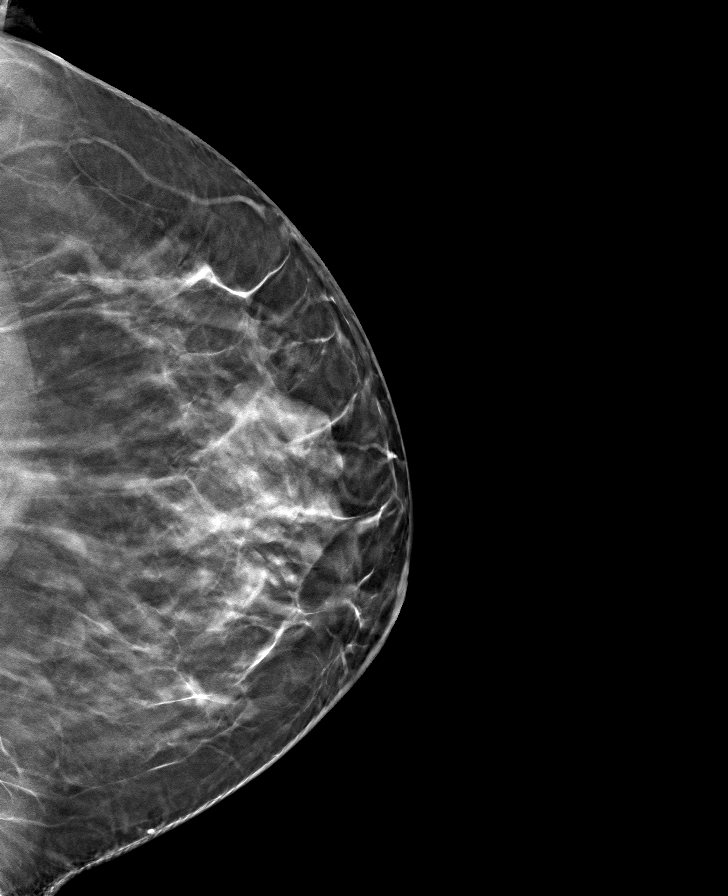

[R MLO tomo · tomo slice 49/98.0]
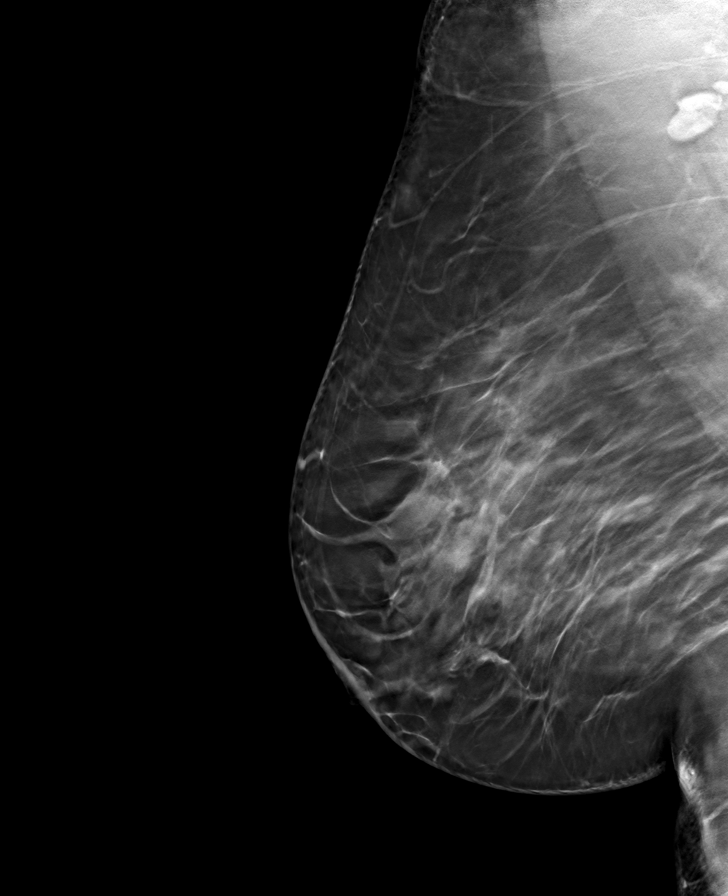

[R CC tomo · tomo slice 42/83.0]
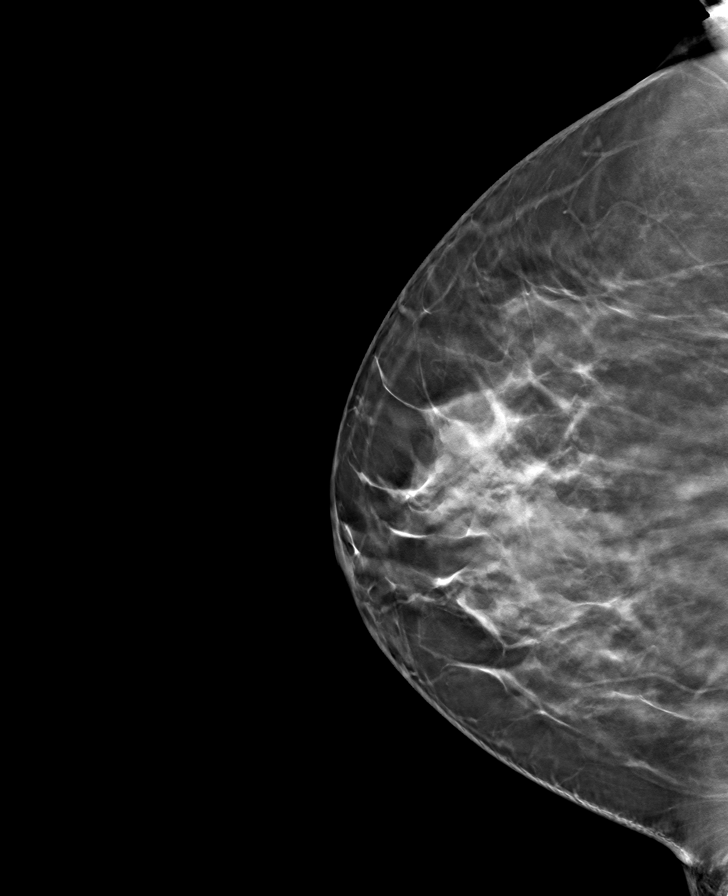

[8 of 24 positions shown; findings below may reference images not displayed]

ACR Breast Density Category c: The breast tissue is heterogeneously
dense, which may obscure small masses
FINDINGS: There are no findings suspicious for malignancy. Images were
processed with CAD.
IMPRESSION: No mammographic evidence of malignancy. A result letter of this
screening mammogram will be mailed directly to the patient.

RECOMMENDATION:
Screening mammogram in one year. (Code:EM-2-IHY)

BI-RADS CATEGORY  1: Negative.

## 2021-06-01 ENCOUNTER — Other Ambulatory Visit: Payer: Self-pay

## 2021-06-11 ENCOUNTER — Other Ambulatory Visit: Payer: Self-pay | Admitting: Critical Care Medicine

## 2021-06-11 ENCOUNTER — Ambulatory Visit: Payer: Self-pay | Admitting: *Deleted

## 2021-06-11 NOTE — Telephone Encounter (Signed)
C/o difficulty breathing and worsening since Monday 06/07/21. Hx asthma. C/o feeling tightness and coughing is causing chest discomfort. No sleeping due to cough and "know it is coming on". Denies gasping for breath, fever, asthma attack. Reports current inhalers and nebulizer  id not working effectively.  Please send medication requested for onset of asthma symptoms to [facility] [address] due to patient does not get off of work until 6 pm and [facility] pharmacy will be closed. Please advise. Care advise given. Patient verbalized understanding of care advise and to call back or go to [facility] or ED if symptoms worsen or call 911. Patient reports she is trying to notify PCP so she will not have to wait in ED.

## 2021-06-11 NOTE — Telephone Encounter (Signed)
Reason for Disposition  [1] Longstanding difficulty breathing (e.g., CHF, COPD, emphysema) AND [2] WORSE than normal  Answer Assessment - Initial Assessment Questions 1. RESPIRATORY STATUS: "Describe your breathing?" (e.g., wheezing, shortness of breath, unable to speak, severe coughing)      Wheezing , coughing  2. ONSET: "When did this breathing problem begin?"      Monday 06/07/21 3. PATTERN "Does the difficult breathing come and go, or has it been constant since it started?"      Comes and goes  4. SEVERITY: "How bad is your breathing?" (e.g., mild, moderate, severe)    - MILD: No SOB at rest, mild SOB with walking, speaks normally in sentences, can lie down, no retractions, pulse < 100.    - MODERATE: SOB at rest, SOB with minimal exertion and prefers to sit, cannot lie down flat, speaks in phrases, mild retractions, audible wheezing, pulse 100-120.    - SEVERE: Very SOB at rest, speaks in single words, struggling to breathe, sitting hunched forward, retractions, pulse > 120      moderate 5. RECURRENT SYMPTOM: "Have you had difficulty breathing before?" If Yes, ask: "When was the last time?" and "What happened that time?"      Yes  6. CARDIAC HISTORY: "Do you have any history of heart disease?" (e.g., heart attack, angina, bypass surgery, angioplasty)      No  7. LUNG HISTORY: "Do you have any history of lung disease?"  (e.g., pulmonary embolus, asthma, emphysema)     Asthma  8. CAUSE: "What do you think is causing the breathing problem?"      Every change of season "I get like this " 9. OTHER SYMPTOMS: "Do you have any other symptoms? (e.g., dizziness, runny nose, cough, chest pain, fever)     Cough  10. O2 SATURATION MONITOR:  "Do you use an oxygen saturation monitor (pulse oximeter) at home?" If Yes, "What is your reading (oxygen level) today?" "What is your usual oxygen saturation reading?" (e.g., 95%)       na 11. PREGNANCY: "Is there any chance you are pregnant?" "When was your  last menstrual period?"       na 12. TRAVEL: "Have you traveled out of the country in the last month?" (e.g., travel history, exposures)       na  Protocols used: Breathing Difficulty-A-AH

## 2021-06-12 MED ORDER — NICOTINE 21 MG/24HR TD PT24
21.0000 mg | MEDICATED_PATCH | Freq: Every day | TRANSDERMAL | 0 refills | Status: DC
  Filled 2021-06-12: qty 28, 28d supply, fill #0

## 2021-06-12 NOTE — Telephone Encounter (Signed)
Requested Prescriptions  Pending Prescriptions Disp Refills  . nicotine (NICODERM CQ - DOSED IN MG/24 HOURS) 21 mg/24hr patch 28 patch 0    Sig: Place 1 patch (21 mg total) onto the skin daily.     Psychiatry:  Drug Dependence Therapy Passed - 06/11/2021  8:20 PM      Passed - Valid encounter within last 12 months    Recent Outpatient Visits          1 month ago Essential hypertension   [place], [place]   4 months ago Mild intermittent asthma without complication   [place], Dionne Bucy, [place]   1 year ago Essential hypertension   [place], [place]      Future Appointments            In 1 month [name] Burnett Harry, MD [place]

## 2021-06-14 ENCOUNTER — Other Ambulatory Visit: Payer: Self-pay

## 2021-06-15 ENCOUNTER — Telehealth: Payer: No Typology Code available for payment source | Admitting: Family

## 2021-06-15 ENCOUNTER — Other Ambulatory Visit: Payer: Self-pay

## 2021-06-15 ENCOUNTER — Ambulatory Visit: Payer: Self-pay

## 2021-06-15 DIAGNOSIS — J4541 Moderate persistent asthma with (acute) exacerbation: Secondary | ICD-10-CM

## 2021-06-15 MED ORDER — BENZONATATE 100 MG PO CAPS: 100.0000 mg | ORAL_CAPSULE | Freq: Three times a day (TID) | ORAL | 0 refills | Status: DC | PRN

## 2021-06-15 MED ORDER — PREDNISONE 10 MG (21) PO TBPK
ORAL_TABLET | ORAL | 0 refills | Status: DC
Start: 2021-06-15 — End: 2021-06-27

## 2021-06-15 NOTE — Patient Instructions (Signed)
Asthma, Adult °Asthma is a long-term (chronic) condition that causes recurrent episodes in which the airways become tight and narrow. The airways are the passages that lead from the nose and mouth down into the lungs. Asthma episodes, also called asthma attacks, can cause coughing, wheezing, shortness of breath, and chest pain. The airways can also fill with mucus. During an attack, it can be difficult to breathe. Asthma attacks can range from minor to life threatening. °Asthma cannot be cured, but medicines and lifestyle changes can help control it and treat acute attacks. °What are the causes? °This condition is believed to be caused by inherited (genetic) and environmental factors, but its exact cause is not known. °There are many things that can bring on an asthma attack or make asthma symptoms worse (triggers). Asthma triggers are different for each person. Common triggers include: °Mold. °Dust. °Cigarette smoke. °Cockroaches. °Things that can cause allergy symptoms (allergens), such as animal dander or pollen from trees or grass. °Air pollutants such as household cleaners, wood smoke, smog, or chemical odors. °Cold air, weather changes, and winds (which increase molds and pollen in the air). °Strong emotional expressions such as crying or laughing hard. °Stress. °Certain medicines (such as aspirin) or types of medicines (such as beta-blockers). °Sulfites in foods and drinks. Foods and drinks that may contain sulfites include dried fruit, potato chips, and sparkling grape juice. °Infections or inflammatory conditions such as the flu, a cold, or inflammation of the nasal membranes (rhinitis). °Gastroesophageal reflux disease (GERD). °Exercise or strenuous activity. °What are the signs or symptoms? °Symptoms of this condition may occur right after asthma is triggered or many hours later. Symptoms include: °Wheezing. This can sound like whistling when you breathe. °Excessive nighttime or early morning  coughing. °Frequent or severe coughing with a common cold. °Chest tightness. °Shortness of breath. °Tiredness (fatigue) with minimal activity. °How is this diagnosed? °This condition is diagnosed based on: °Your medical history. °A physical exam. °Tests, which may include: °Lung function studies and pulmonary studies (spirometry). These tests can evaluate the flow of air in your lungs. °Allergy tests. °Imaging tests, such as X-rays. °How is this treated? °There is no cure for this condition, but treatment can help control your symptoms. Treatment for asthma usually involves: °Identifying and avoiding your asthma triggers. °Using medicines to control your symptoms. Generally, two types of medicines are used to treat asthma: °Controller medicines. These help prevent asthma symptoms from occurring. They are usually taken every day. °Fast-acting reliever or rescue medicines. These quickly relieve asthma symptoms by widening the narrow and tight airways. They are used as needed and provide short-term relief. °Using supplemental oxygen. This may be needed during a severe episode. °Using other medicines, such as: °Allergy medicines, such as antihistamines, if your asthma attacks are triggered by allergens. °Immune medicines (immunomodulators). These are medicines that help control the immune system. °Creating an asthma action plan. An asthma action plan is a written plan for managing and treating your asthma attacks. This plan includes: °A list of your asthma triggers and how to avoid them. °Information about when medicines should be taken and when their dosage should be changed. °Instructions about using a device called a peak flow meter. A peak flow meter measures how well the lungs are working and the severity of your asthma. It helps you monitor your condition. °Follow these instructions at home: °Controlling your home environment °Control your home environment in the following ways to help avoid triggers and prevent  asthma attacks: °Change your heating   and air conditioning filter regularly. °Limit your use of fireplaces and wood stoves. °Get rid of pests (such as roaches and mice) and their droppings. °Throw away plants if you see mold on them. °Clean floors and dust surfaces regularly. Use unscented cleaning products. °Try to have someone else vacuum for you regularly. Stay out of rooms while they are being vacuumed and for a short while afterward. If you vacuum, use a dust mask from a hardware store, a double-layered or microfilter vacuum cleaner bag, or a vacuum cleaner with a HEPA filter. °Replace carpet with wood, tile, or vinyl flooring. Carpet can trap dander and dust. °Use allergy-proof pillows, mattress covers, and box spring covers. °Keep your bedroom a trigger-free room. °Avoid pets and keep windows closed when allergens are in the air. °Wash beddings every week in hot water and dry them in a dryer. °Use blankets that are made of polyester or cotton. °Clean bathrooms and kitchens with bleach. If possible, have someone repaint the walls in these rooms with mold-resistant paint. Stay out of the rooms that are being cleaned and painted. °Wash your hands often with soap and water. If soap and water are not available, use hand sanitizer. °Do not allow anyone to smoke in your home. °General instructions °Take over-the-counter and prescription medicines only as told by your health care provider. °Speak with your health care provider if you have questions about how or when to take the medicines. °Make note if you are requiring more frequent dosages. °Do not use any products that contain nicotine or tobacco, such as cigarettes and e-cigarettes. If you need help quitting, ask your health care provider. Also, avoid being exposed to secondhand smoke. °Use a peak flow meter as told by your health care provider. Record and keep track of the readings. °Understand and use the asthma action plan to help minimize, or stop an asthma  attack, without needing to seek medical care. °Make sure you stay up to date on your yearly vaccinations as told by your health care provider. This may include vaccines for the flu and pneumonia. °Avoid outdoor activities when allergen counts are high and when air quality is low. °Wear a ski mask that covers your nose and mouth during outdoor winter activities. Exercise indoors on cold days if you can. °Warm up before exercising, and take time for a cool-down period after exercise. °Keep all follow-up visits as told by your health care provider. This is important. °Where to find more information °For information about asthma, turn to the Centers for Disease Control and Prevention at www.cdc.gov/asthma/faqs °For air quality information, turn to AirNow at airnow.gov °Contact a health care provider if: °You have wheezing, shortness of breath, or a cough even while you are taking medicine to prevent attacks. °The mucus you cough up (sputum) is thicker than usual. °Your sputum changes from clear or white to yellow, green, gray, or bloody. °Your medicines are causing side effects, such as a rash, itching, swelling, or trouble breathing. °You need to use a reliever medicine more than 2-3 times a week. °Your peak flow reading is still at 50-79% of your personal best after following your action plan for 1 hour. °You have a fever. °Get help right away if: °You are getting worse and do not respond to treatment during an asthma attack. °You are short of breath when at rest or when doing very little physical activity. °You have difficulty eating, drinking, or talking. °You have chest pain or tightness. °You develop a fast heartbeat or   palpitations. °You have a bluish color to your lips or fingernails. °You are light-headed or dizzy, or you faint. °Your peak flow reading is less than 50% of your personal best. °You feel too tired to breathe normally. °Summary °Asthma is a long-term (chronic) condition that causes recurrent  episodes in which the airways become tight and narrow. These episodes can cause coughing, wheezing, shortness of breath, and chest pain. °Asthma cannot be cured, but medicines and lifestyle changes can help control it and treat acute attacks. °Make sure you understand how to avoid triggers and how and when to use your medicines. °Asthma attacks can range from minor to life threatening. Get help right away if you have an asthma attack and do not respond to treatment with your usual rescue medicines. °This information is not intended to replace advice given to you by your health care provider. Make sure you discuss any questions you have with your health care provider. °Document Revised: 05/08/2020 Document Reviewed: 12/11/2019 °Elsevier Patient Education © 2022 [employer] ° °

## 2021-06-15 NOTE — Progress Notes (Signed)
Virtual Visit Consent   Gaynel Schaafsma, you are scheduled for a virtual visit with a [facility] provider today.     Just as with appointments in the office, your consent must be obtained to participate.  Your consent will be active for this visit and any virtual visit you may have with one of our providers in the next 365 days.     If you have a MyChart account, a copy of this consent can be sent to you electronically.  All virtual visits are billed to your insurance company just like a traditional visit in the office.    As this is a virtual visit, video technology does not allow for your provider to perform a traditional examination.  This may limit your provider's ability to fully assess your condition.  If your provider identifies any concerns that need to be evaluated in person or the need to arrange testing (such as labs, EKG, etc.), we will make arrangements to do so.     Although advances in technology are sophisticated, we cannot ensure that it will always work on either your end or our end.  If the connection with a video visit is poor, the visit may have to be switched to a telephone visit.  With either a video or telephone visit, we are not always able to ensure that we have a secure connection.     I need to obtain your verbal consent now.   Are you willing to proceed with your visit today?    Takeyah Wieman has provided verbal consent on 06/15/2021 for a virtual visit (video or telephone).   Evelina Dun, FNP   Date: 06/15/2021 5:47 PM   Virtual Visit via Video Note   I, Evelina Dun, connected with  Ethelle Ola  ([id number], 1971/06/18) on 06/15/21 at  5:45 PM EDT by a video-enabled telemedicine application and verified that I am speaking with the correct person using two identifiers.  Location: Patient: Virtual Visit Location Patient: Other: car Provider: Virtual Visit Location Provider: Home   I discussed the limitations of evaluation and management by telemedicine  and the availability of in person appointments. The patient expressed understanding and agreed to proceed.    History of Present Illness: Angela Burton is a 50 y.o. who identifies as a female who was assigned female at birth, and is being seen today for asthma.  HPI: Asthma She complains of chest tightness, cough, difficulty breathing (at times), frequent throat clearing, shortness of breath, sputum production and wheezing. The current episode started 1 to 4 weeks ago. The problem has been gradually worsening. The cough is non-productive. Associated symptoms include nasal congestion, rhinorrhea and sneezing. Pertinent negatives include no dyspnea on exertion, ear congestion, fever, malaise/fatigue, orthopnea or PND. Her symptoms are alleviated by rest and beta-agonist. Her past medical history is significant for asthma.   Problems:  Patient Active Problem List   Diagnosis Date Noted   IUD threads lost 10/22/2020   Encounter for insertion of progestin-releasing intrauterine contraceptive device (IUD) 06/22/2020   Irregular menses 06/22/2020   Encounter for postoperative care 05/21/2020   Retained intrauterine contraceptive device (IUD)    Obesity (BMI 35.0-39.9 without comorbidity) 04/22/2020   Hypertension 03/24/2020   Right ovarian cyst 03/24/2020   Fibroid, uterine 03/24/2020   Presence of copper intrauterine contraceptive device 03/24/2020   Tobacco use 03/24/2020   Snoring 04/05/2016   Asthma 03/04/2016    Allergies:  Allergies  Allergen Reactions   Sulfa Antibiotics Anaphylaxis, Hives, Swelling and  Rash   Aspirin Hives   Erythromycin Other (See Comments)    Had a seizure.    Medications:  Current Outpatient Medications:    benzonatate (TESSALON PERLES) 100 MG capsule, Take 1 capsule (100 mg total) by mouth 3 (three) times daily as needed., Disp: 20 capsule, Rfl: 0   predniSONE (STERAPRED UNI-PAK 21 TAB) 10 MG (21) TBPK tablet, Use as directed, Disp: 21 tablet, Rfl: 0    albuterol (PROVENTIL) (2.5 MG/3ML) 0.083% nebulizer solution, Take 3 mLs (2.5 mg total) by nebulization every 6 (six) hours as needed for wheezing or shortness of breath., Disp: 150 mL, Rfl: 1   albuterol (VENTOLIN HFA) 108 (90 Base) MCG/ACT inhaler, Inhale 1-2 puffs into the lungs every 6 (six) hours as needed for wheezing or shortness of breath., Disp: 18 g, Rfl: 1   amLODipine (NORVASC) 10 MG tablet, Take 1 tablet (10 mg total) by mouth daily., Disp: 60 tablet, Rfl: 3   cetirizine (ZYRTEC) 10 MG tablet, Take 1 tablet (10 mg total) by mouth daily., Disp: 30 tablet, Rfl: 2   fluticasone (FLOVENT HFA) 110 MCG/ACT inhaler, Inhale 2 puffs into the lungs 2 (two) times daily., Disp: 16 g, Rfl: 12   hydrochlorothiazide (HYDRODIURIL) 25 MG tablet, TAKE 1 TABLET(25 MG) BY MOUTH DAILY, Disp: 60 tablet, Rfl: 4   nicotine (NICODERM CQ - DOSED IN MG/24 HOURS) 21 mg/24hr patch, Place 1 patch (21 mg total) onto the skin daily., Disp: 28 patch, Rfl: 0  Observations/Objective: Patient is well-developed, well-nourished in no acute distress.  Resting comfortably in care.  Head is normocephalic, atraumatic.  No labored breathing. Frequent throat clearing and dry cough Speech is clear and coherent with logical content.  Patient is alert and oriented at baseline.    Assessment and Plan: 1. Moderate persistent asthma with exacerbation - predniSONE (STERAPRED UNI-PAK 21 TAB) 10 MG (21) TBPK tablet; Use as directed  Dispense: 21 tablet; Refill: 0 - benzonatate (TESSALON PERLES) 100 MG capsule; Take 1 capsule (100 mg total) by mouth 3 (three) times daily as needed.  Dispense: 20 capsule; Refill: 0 Avoid allergens  Rest Force fluids Continue Albuterol Go to ED with any increased SOB   Follow Up Instructions: I discussed the assessment and treatment plan with the patient. The patient was provided an opportunity to ask questions and all were answered. The patient agreed with the plan and demonstrated an  understanding of the instructions.  A copy of instructions were sent to the patient via MyChart unless otherwise noted below.     The patient was advised to call back or seek an in-person evaluation if the symptoms worsen or if the condition fails to improve as anticipated.  Time:  I spent 10 minutes with the patient via telehealth technology discussing the above problems/concerns.    Evelina Dun, FNP

## 2021-06-15 NOTE — Telephone Encounter (Signed)
Pt c/o intermittent wheezing and cough since last Wednesday. Pt has been using all bronchodilators as prescribed with no relief. Pt requested Prednisone on 06/11/21 and was frustrated no one called her or ordered the medication. Pt stated her asthma flares with weather changes.  Mild SOB. Cough worse at night. Care advice given and pt verbalized understanding. Pt has appt at 1745 for virtual UC visit.      Reason for Disposition  [1] Longstanding difficulty breathing AND [2] not responding to usual therapy  Answer Assessment - Initial Assessment Questions 1. RESPIRATORY STATUS: "Describe your breathing?" (e.g., wheezing, shortness of breath, unable to speak, severe coughing)      Tight, SOB, cough with taking 2. ONSET: "When did this breathing problem begin?"      Last Wednesday 3. PATTERN "Does the difficult breathing come and go, or has it been constant since it started?"      Comes and goes worse at night 4. SEVERITY: "How bad is your breathing?" (e.g., mild, moderate, severe)    - MILD: No SOB at rest, mild SOB with walking, speaks normally in sentences, can lie down, no retractions, pulse < 100.    - MODERATE: SOB at rest, SOB with minimal exertion and prefers to sit, cannot lie down flat, speaks in phrases, mild retractions, audible wheezing, pulse 100-120.    - SEVERE: Very SOB at rest, speaks in single words, struggling to breathe, sitting hunched forward, retractions, pulse > 120    mild 5. RECURRENT SYMPTOM: "Have you had difficulty breathing before?" If Yes, ask: "When was the last time?" and "What happened that time?"      Yes= albuterol and Prednisone 6. CARDIAC HISTORY: "Do you have any history of heart disease?" (e.g., heart attack, angina, bypass surgery, angioplasty)      HTN 7. LUNG HISTORY: "Do you have any history of lung disease?"  (e.g., pulmonary embolus, asthma, emphysema)     asthma 8. CAUSE: "What do you think is causing the breathing problem?"      Weather  change 9. OTHER SYMPTOMS: "Do you have any other symptoms? (e.g., dizziness, runny nose, cough, chest pain, fever)     Cough, intermittent wheezing 10. O2 SATURATION MONITOR:  "Do you use an oxygen saturation monitor (pulse oximeter) at home?" If Yes, "What is your reading (oxygen level) today?" "What is your usual oxygen saturation reading?" (e.g., 95%)       no 11. PREGNANCY: "Is there any chance you are pregnant?" "When was your last menstrual period?"       no 12. TRAVEL: "Have you traveled out of the country in the last month?" (e.g., travel history, exposures)       no  Protocols used: Breathing Difficulty-A-AH

## 2021-06-16 NOTE — Telephone Encounter (Signed)
Per epic pt had a virtual visit to discuss asthma.

## 2021-06-21 ENCOUNTER — Other Ambulatory Visit: Payer: Self-pay

## 2021-06-27 ENCOUNTER — Other Ambulatory Visit: Payer: Self-pay

## 2021-06-27 DIAGNOSIS — J4541 Moderate persistent asthma with (acute) exacerbation: Secondary | ICD-10-CM

## 2021-06-28 ENCOUNTER — Other Ambulatory Visit: Payer: Self-pay

## 2021-06-28 ENCOUNTER — Other Ambulatory Visit: Payer: Self-pay | Admitting: Physician Assistant

## 2021-06-28 ENCOUNTER — Other Ambulatory Visit: Payer: Self-pay | Admitting: Critical Care Medicine

## 2021-06-28 DIAGNOSIS — J4520 Mild intermittent asthma, uncomplicated: Secondary | ICD-10-CM

## 2021-06-28 MED ORDER — BENZONATATE 100 MG PO CAPS
100.0000 mg | ORAL_CAPSULE | Freq: Three times a day (TID) | ORAL | 0 refills | Status: DC | PRN
  Filled 2021-06-28: qty 20, 7d supply, fill #0

## 2021-06-28 MED ORDER — PREDNISONE 10 MG PO TABS
ORAL_TABLET | ORAL | 0 refills | Status: DC
  Filled 2021-06-28: qty 21, 6d supply, fill #0

## 2021-06-28 NOTE — Telephone Encounter (Signed)
Future OV December' 22. Last ordered 03/25/21. Approved per protocol.  Requested Prescriptions  Pending Prescriptions Disp Refills  . albuterol (PROVENTIL) (2.5 MG/3ML) 0.083% nebulizer solution [Pharmacy Med Name: ALBUTEROL 0.083%(2.5MG /5XM) 25X3ML] 150 mL 0    Sig: USE 1 VIAL VIA NEBULIZER EVERY 6 HOURS AS NEEDED FOR WHEEZING OR SHORTNESS OF BREATH     Pulmonology:  Beta Agonists Failed - 06/28/2021  7:39 AM      Failed - One inhaler should last at least one month. If the patient is requesting refills earlier, contact the patient to check for uncontrolled symptoms.      Passed - Valid encounter within last 12 months    Recent Outpatient Visits          1 month ago Essential hypertension   [place], MD   4 months ago Mild intermittent asthma without complication   [place], Dionne Bucy, [place]   1 year ago Essential hypertension   [place], [place]      Future Appointments            In 1 month [name] Burnett Harry, MD [place]

## 2021-06-29 ENCOUNTER — Other Ambulatory Visit: Payer: Self-pay

## 2021-07-23 IMAGING — US US TRANSVAGINAL NON-OB
1 series · 15 of 25 positions shown · non-contrast
Comparison: Prior ultrasound from 02/14/2020

CLINICAL DATA: Initial evaluation for heavy vaginal bleeding.
Evaluate IUD placement.

EXAM:
ULTRASOUND PELVIS TRANSVAGINAL
TECHNIQUE: Transvaginal ultrasound examination of the pelvis was performed
including evaluation of the uterus, ovaries, adnexal regions, and
pelvic cul-de-sac.

[Series 1: us transvaginal non-ob · 30 acquisitions, 15 frames shown]
[im 1/30]
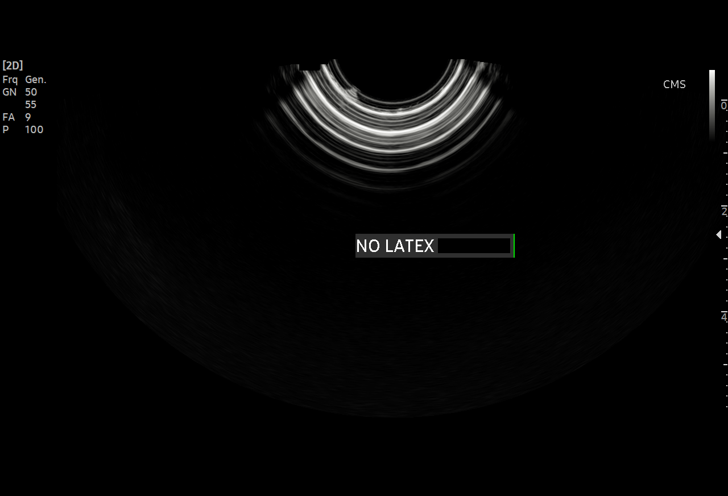
[im 3/30]
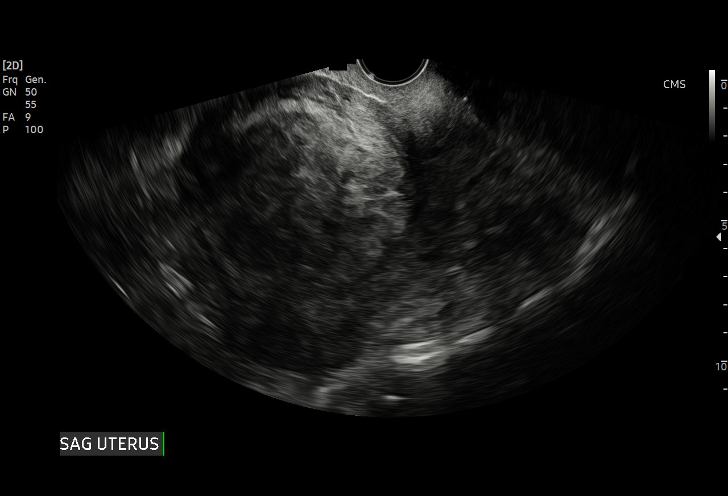
[im 5/30]
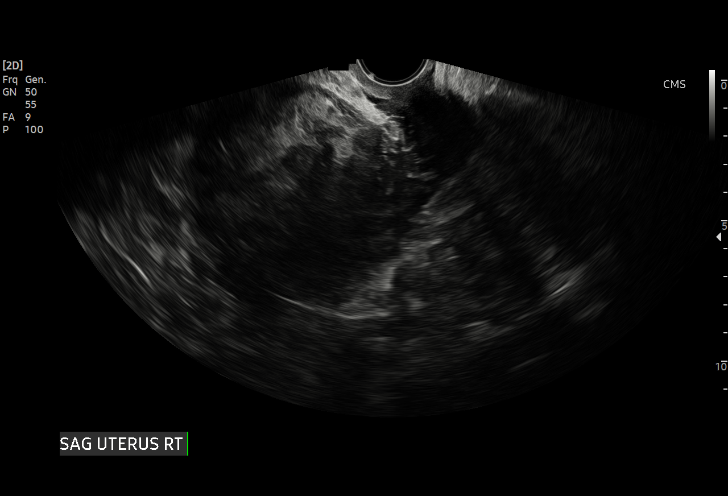
[im 7/30]
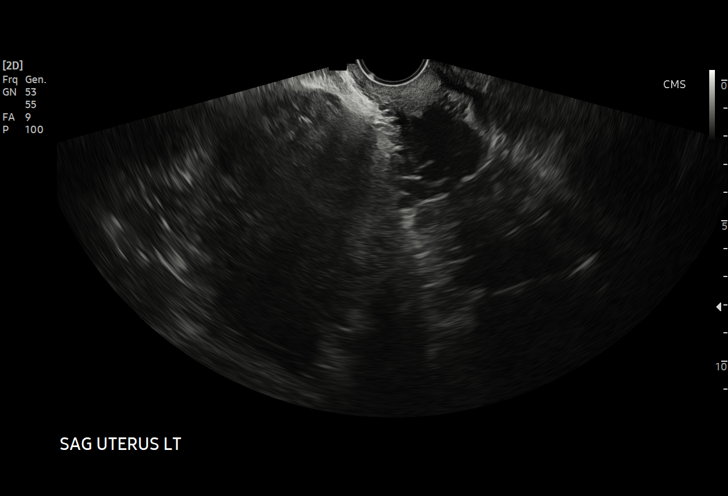
[im 9/30]
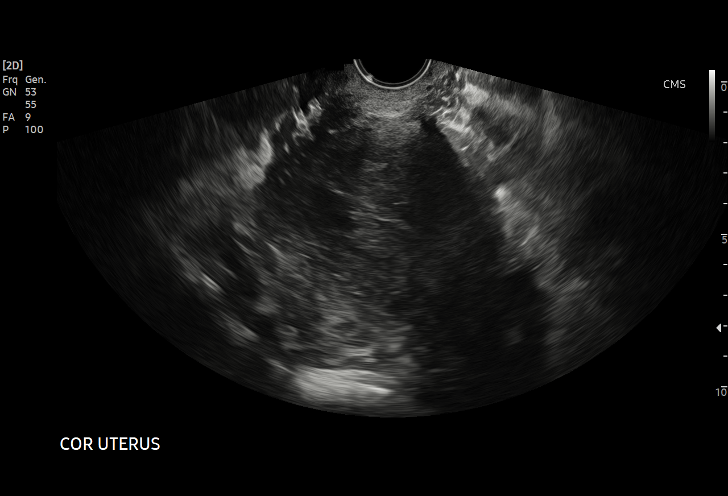
[im 11/30]
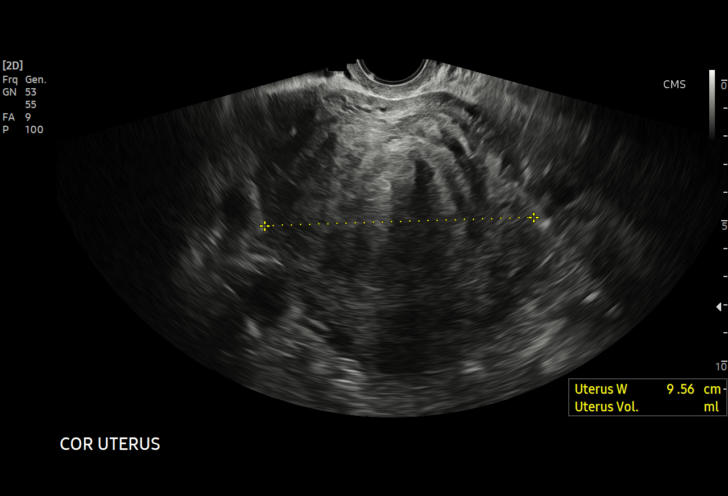
[im 13/30]
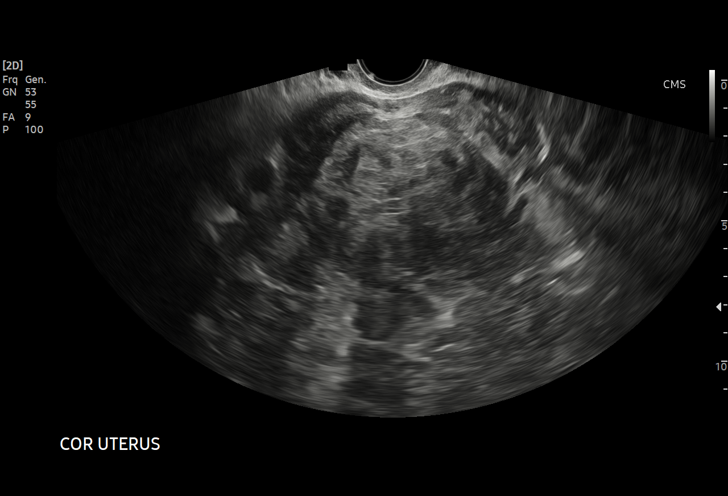
[im 15/30]
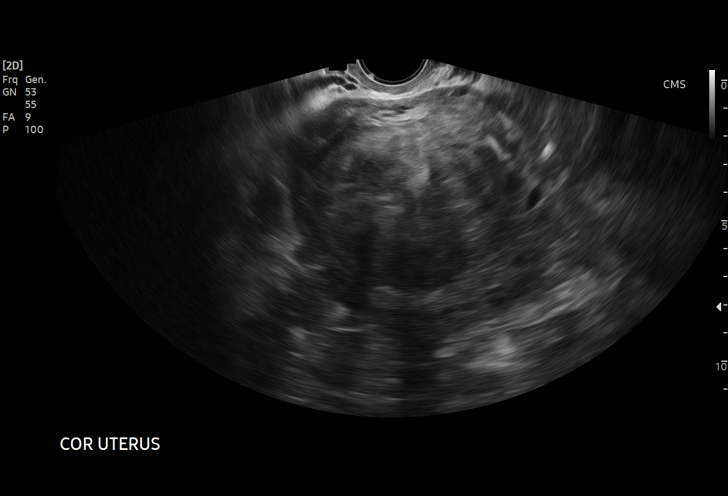
[im 17/30]
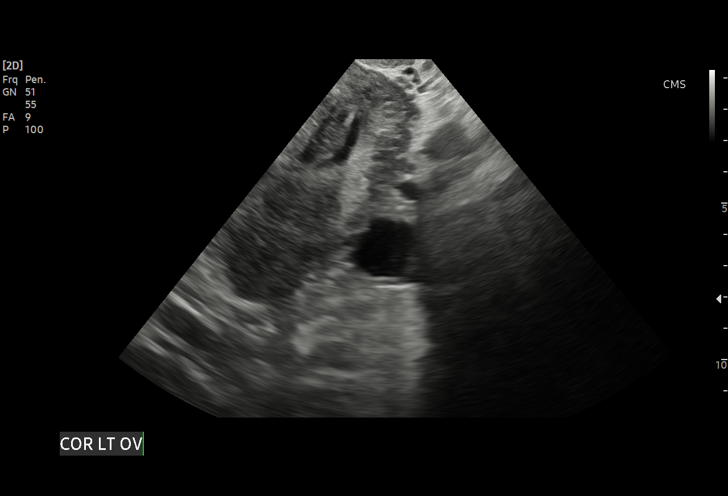
[im 19/30]
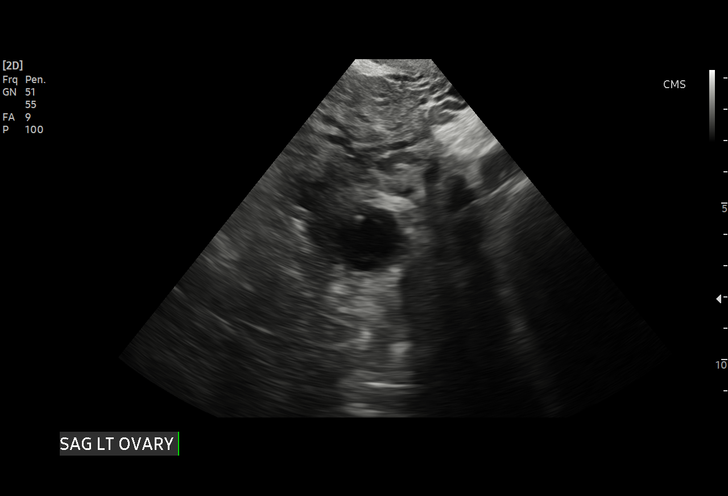
[im 21/30]
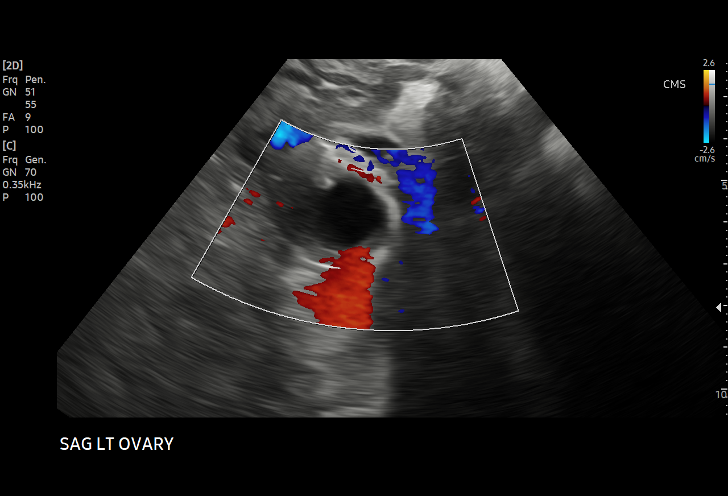
[im 23/30]
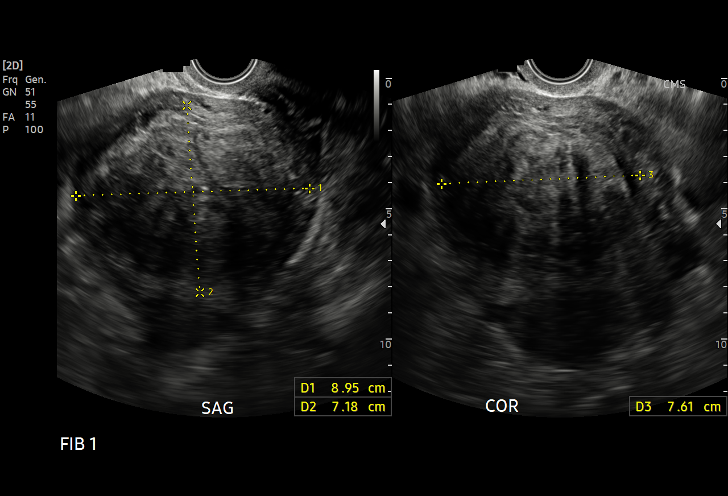
[im 25/30]
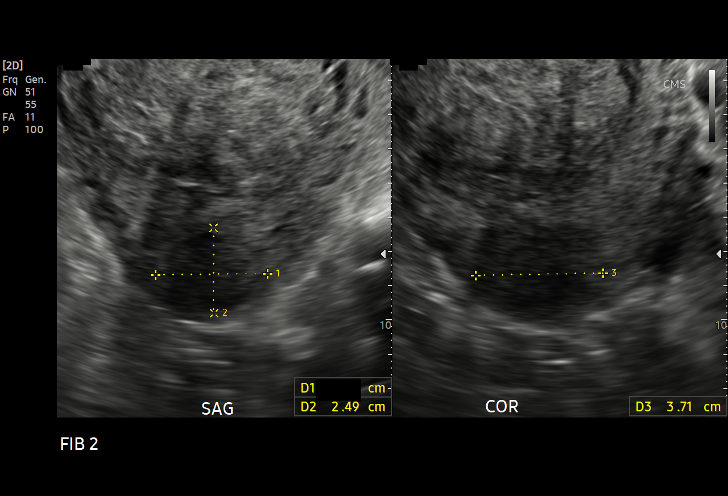
[im 27/30]
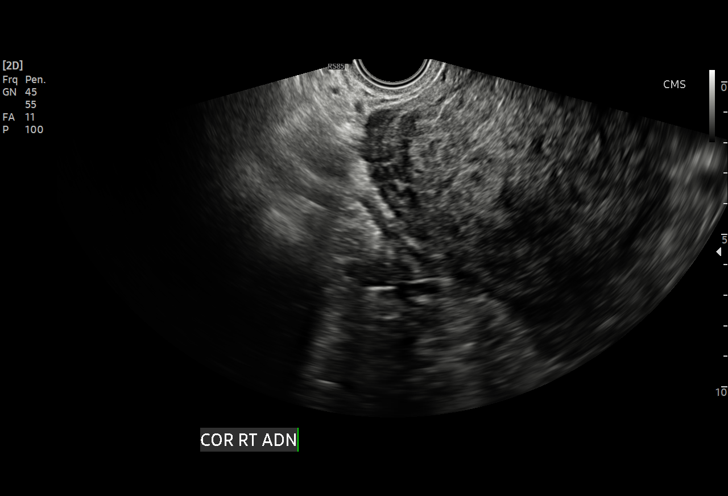
[im 30/30]
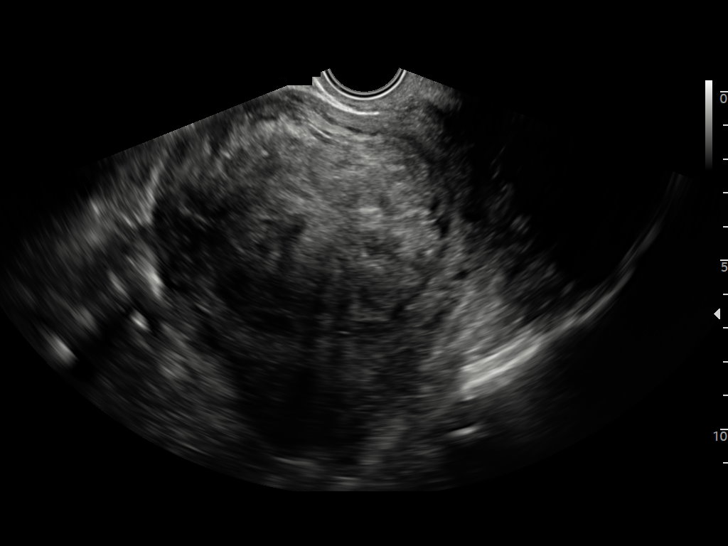

[15 of 25 positions shown; findings below may reference images not displayed]

FINDINGS: Uterus

Measurements: 12.2 x 10.4 x 9.6 cm = volume: 631.0 mL. Uterus is
anteverted. Large intramural fibroid positioned at the central
aspect of the uterus measures 9.0 x 7.6 x 7.2 cm. Additional
subserosal fibroid at the posterior uterine body measures 2.7 x
x 2.5 cm

Endometrium

Endometrial stripe obscured by overlying fibroid. Previously seen
IUD is not definitely visualized on today's exam, raising the
possibility for interval malpositioning.

Right ovary

Not visualized.  No adnexal mass.

Left ovary

Measurements: 3.6 x 2.4 x 2.5 cm = volume: 11.6 mL. Normal
appearance/no adnexal mass.

Other findings:  No abnormal free fluid
IMPRESSION: 1. Previously seen IUD not definitely visualized on today's exam,
raising the possibility for interval malpositioning. Correlation
with x-ray of the pelvis suggested for further evaluation.
2. Enlarged fibroid uterus, with the dominant fibroid now measuring
up to 9 cm, increased in size from previous.
3. Endometrial stripe obscured by overlying fibroid, and not
assessed on this exam. Further evaluation with dedicated
sonohysterography and/or pelvic MRI could be performed for further
evaluation as warranted.
4. Normal sonographic appearance of the left ovary, with
nonvisualization of the right ovary. No adnexal mass or free fluid.

## 2021-07-29 ENCOUNTER — Other Ambulatory Visit: Payer: Self-pay | Admitting: Obstetrics and Gynecology

## 2021-07-29 DIAGNOSIS — Z1231 Encounter for screening mammogram for malignant neoplasm of breast: Secondary | ICD-10-CM

## 2021-08-03 ENCOUNTER — Ambulatory Visit: Payer: Self-pay | Attending: Critical Care Medicine | Admitting: Critical Care Medicine

## 2021-08-03 ENCOUNTER — Encounter: Payer: Self-pay | Admitting: Critical Care Medicine

## 2021-08-03 ENCOUNTER — Other Ambulatory Visit: Payer: Self-pay | Admitting: Critical Care Medicine

## 2021-08-03 ENCOUNTER — Other Ambulatory Visit: Payer: Self-pay

## 2021-08-03 VITALS — BP 123/84 | HR 62 | Resp 16 | Wt 249.6 lb

## 2021-08-03 DIAGNOSIS — I10 Essential (primary) hypertension: Secondary | ICD-10-CM

## 2021-08-03 DIAGNOSIS — R519 Headache, unspecified: Secondary | ICD-10-CM | POA: Insufficient documentation

## 2021-08-03 DIAGNOSIS — K056 Periodontal disease, unspecified: Secondary | ICD-10-CM

## 2021-08-03 DIAGNOSIS — H539 Unspecified visual disturbance: Secondary | ICD-10-CM | POA: Insufficient documentation

## 2021-08-03 DIAGNOSIS — R42 Dizziness and giddiness: Secondary | ICD-10-CM

## 2021-08-03 DIAGNOSIS — J4520 Mild intermittent asthma, uncomplicated: Secondary | ICD-10-CM

## 2021-08-03 DIAGNOSIS — Z87891 Personal history of nicotine dependence: Secondary | ICD-10-CM

## 2021-08-03 DIAGNOSIS — R7303 Prediabetes: Secondary | ICD-10-CM

## 2021-08-03 MED ORDER — ALBUTEROL SULFATE HFA 108 (90 BASE) MCG/ACT IN AERS
1.0000 | INHALATION_SPRAY | Freq: Four times a day (QID) | RESPIRATORY_TRACT | 1 refills | Status: DC | PRN
  Filled 2021-08-03 – 2021-08-11 (×2): qty 18, 25d supply, fill #0
  Filled 2021-10-04: qty 18, 25d supply, fill #1
  Filled 2021-10-05: qty 18, 25d supply, fill #0

## 2021-08-03 MED FILL — Albuterol Sulfate Soln Nebu 0.083% (2.5 MG/3ML): RESPIRATORY_TRACT | 7 days supply | Qty: 90 | Fill #0 | Status: CN

## 2021-08-03 MED FILL — Albuterol Sulfate Soln Nebu 0.083% (2.5 MG/3ML): RESPIRATORY_TRACT | 22 days supply | Qty: 270 | Fill #0 | Status: CN

## 2021-08-03 NOTE — Assessment & Plan Note (Signed)
Patient is no longer smoking since November

## 2021-08-03 NOTE — Assessment & Plan Note (Signed)
Recheck W4R and metabolic panel given recent steroid pulse

## 2021-08-03 NOTE — Progress Notes (Signed)
Patient states for the last two months she has been dizziness(lightheadedness) also states her vision has changed. She states that from having glasses to read to now needing glasses to see.

## 2021-08-03 NOTE — Progress Notes (Signed)
Established Patient Office Visit  Subjective:  Patient ID: Angela Burton, female    DOB: 1971/03/11  Age: 50 y.o. MRN: [id number]  CC:  Chief Complaint  Patient presents with   Dizziness   Blurred Vision    HPI Angela Burton presents for primary care follow-up.  Note she has had increased dizziness headaches and visual changes over the past several weeks.  She also had an episode of emesis with this.  Note she has quit smoking cigarettes since Thanksgiving and today blood pressure 123/84.  This is actually off amlodipine for 2 weeks.  She is only taking hydrochlorothiazide 25 mg daily.  Note the patient does have the orange card [employer] discount.  Patient is interested in a dental exam and eye exam.  The patient has been trying to comply with improve diet. The patient does need colon cancer screening  Past Medical History:  Diagnosis Date   Asthma    albuterol   Hypertension    Obesity     Past Surgical History:  Procedure Laterality Date   DILATION AND CURETTAGE OF UTERUS     HYSTEROSCOPY WITH D & C N/A 05/05/2020   Procedure: DILATATION AND CURETTAGE /HYSTEROSCOPY AND IUD REMOVAL;  Surgeon: [name], MD;  Location: [facility];  Service: Gynecology;  Laterality: N/A;   LAPAROSCOPIC OVARIAN CYSTECTOMY Right 05/05/2020   Procedure: OPERATIVE LAPAROSCOPY AND LAPAROSCOPIC OVARIAN CYSTECTOMY;  Surgeon: [name], MD;  Location: [facility];  Service: Gynecology;  Laterality: Right;   TUBAL LIGATION      Family History  Problem Relation Age of Onset   Heart attack Mother    Diabetes Father    Hypertension Father    High Cholesterol Father    Breast cancer Sister    HIV Sister     Social History   Socioeconomic History   Marital status: Legally Separated    Spouse name: Not on file   Number of children: Not on file   Years of education: Not on file   Highest education level: Not on file  Occupational History   Not on  file  Tobacco Use   Smoking status: Former    Packs/day: 0.15    Types: Cigarettes    Quit date: 07/07/2021    Years since quitting: 0.0   Smokeless tobacco: Never   Tobacco comments:    3 cigarettes/2 wks  Vaping Use   Vaping Use: Never used  Substance and Sexual Activity   Alcohol use: Yes    Alcohol/week: 2.0 standard drinks    Types: 2 Glasses of wine per week    Comment: rarely   Drug use: Yes    Types: Marijuana    Comment: sometimes    Sexual activity: Yes    Birth control/protection: I.U.D., Surgical  Other Topics Concern   Not on file  Social History Narrative   Not on file   Social Determinants of Health   Financial Resource Strain: Not on file  Food Insecurity: Food Insecurity Present   Worried About [place] in the Last Year: Sometimes true   Ran Out of Food in the Last Year: Sometimes true  Transportation Needs: No Transportation Needs   Lack of Transportation (Medical): No   Lack of Transportation (Non-Medical): No  Physical Activity: Not on file  Stress: Not on file  Social Connections: Not on file  Intimate Partner Violence: Not on file    Outpatient Medications Prior to Visit  Medication Sig Dispense Refill   albuterol (PROVENTIL) (2.5 MG/3ML) 0.083% nebulizer solution USE 1 VIAL VIA NEBULIZER EVERY 6 HOURS AS NEEDED FOR WHEEZING OR SHORTNESS OF BREATH 150 mL 0   albuterol (VENTOLIN HFA) 108 (90 Base) MCG/ACT inhaler Inhale 1-2 puffs into the lungs every 6 (six) hours as needed for wheezing or shortness of breath. 18 g 1   cetirizine (ZYRTEC) 10 MG tablet Take 1 tablet (10 mg total) by mouth daily. 30 tablet 2   fluticasone (FLOVENT HFA) 110 MCG/ACT inhaler Inhale 2 puffs into the lungs 2 (two) times daily. 16 g 12   hydrochlorothiazide (HYDRODIURIL) 25 MG tablet TAKE 1 TABLET(25 MG) BY MOUTH DAILY 60 tablet 4   amLODipine (NORVASC) 10 MG tablet Take 1 tablet (10 mg total) by mouth daily. (Patient not taking: Reported on 08/03/2021) 60  tablet 3   benzonatate (TESSALON PERLES) 100 MG capsule Take 1 capsule (100 mg total) by mouth 3 (three) times daily as needed. (Patient not taking: Reported on 08/03/2021) 20 capsule 0   nicotine (NICODERM CQ - DOSED IN MG/24 HOURS) 21 mg/24hr patch Place 1 patch (21 mg total) onto the skin daily. (Patient not taking: Reported on 08/03/2021) 28 patch 0   predniSONE (DELTASONE) 10 MG tablet Take 6 tablets on day 1, 5 tablets on day 2, 4 tablets on day 3, 3 tablets on day 4, 2 tablets on day 5, 1 tablet on day 6 (Patient not taking: Reported on 08/03/2021) 21 tablet 0   No facility-administered medications prior to visit.    Allergies  Allergen Reactions   Sulfa Antibiotics Anaphylaxis, Hives, Swelling and Rash   Aspirin Hives   Erythromycin Other (See Comments)    Had a seizure.     ROS Review of Systems  Constitutional:  Negative for chills, diaphoresis and fever.  HENT:  Positive for postnasal drip and rhinorrhea. Negative for congestion, hearing loss, nosebleeds, sore throat and tinnitus.   Eyes:  Positive for visual disturbance. Negative for photophobia, pain, redness and itching.  Respiratory:  Negative for cough, shortness of breath, wheezing and stridor.   Cardiovascular:  Negative for chest pain, palpitations and leg swelling.  Gastrointestinal:  Positive for vomiting. Negative for abdominal pain, blood in stool, constipation, diarrhea and nausea.  Endocrine: Negative for polydipsia.  Genitourinary:  Negative for dysuria, flank pain, frequency, hematuria and urgency.  Musculoskeletal:  Negative for back pain, myalgias and neck pain.  Skin:  Negative for rash.  Allergic/Immunologic: Negative for environmental allergies.  Neurological:  Positive for dizziness, light-headedness and headaches. Negative for tremors, seizures, syncope, facial asymmetry, speech difficulty, weakness and numbness.  Hematological:  Does not bruise/bleed easily.  Psychiatric/Behavioral:  Negative for  suicidal ideas. The patient is not nervous/anxious.      Objective:    Physical Exam Vitals reviewed.  Constitutional:      Appearance: Normal appearance. She is well-developed. She is not diaphoretic.  HENT:     Head: Normocephalic and atraumatic.     Nose: No nasal deformity, septal deviation, mucosal edema or rhinorrhea.     Right Sinus: No maxillary sinus tenderness or frontal sinus tenderness.     Left Sinus: No maxillary sinus tenderness or frontal sinus tenderness.     Mouth/Throat:     Mouth: Mucous membranes are moist.     Pharynx: Oropharynx is clear. No oropharyngeal exudate.     Comments: Mild periodontal disease Eyes:     General: No scleral icterus.    Conjunctiva/sclera: Conjunctivae normal.  Pupils: Pupils are equal, round, and reactive to light.  Neck:     Thyroid: No thyromegaly.     Vascular: No carotid bruit or JVD.     Trachea: Trachea normal. No tracheal tenderness or tracheal deviation.  Cardiovascular:     Rate and Rhythm: Normal rate and regular rhythm.     Chest Wall: PMI is not displaced.     Pulses: Normal pulses. No decreased pulses.     Heart sounds: Normal heart sounds, S1 normal and S2 normal. Heart sounds not distant. No murmur heard. No systolic murmur is present.  No diastolic murmur is present.    No friction rub. No gallop. No S3 or S4 sounds.  Pulmonary:     Effort: Pulmonary effort is normal. No tachypnea, accessory muscle usage or respiratory distress.     Breath sounds: Normal breath sounds. No stridor. No decreased breath sounds, wheezing, rhonchi or rales.  Chest:     Chest wall: No tenderness.  Abdominal:     General: Bowel sounds are normal. There is no distension.     Palpations: Abdomen is soft. Abdomen is not rigid.     Tenderness: There is no abdominal tenderness. There is no guarding or rebound.  Musculoskeletal:        General: Normal range of motion.     Cervical back: Normal range of motion and neck supple. No edema,  erythema or rigidity. No muscular tenderness. Normal range of motion.  Lymphadenopathy:     Head:     Right side of head: No submental or submandibular adenopathy.     Left side of head: No submental or submandibular adenopathy.     Cervical: No cervical adenopathy.  Skin:    General: Skin is warm and dry.     Coloration: Skin is not pale.     Findings: No rash.     Nails: There is no clubbing.  Neurological:     General: No focal deficit present.     Mental Status: She is alert and oriented to person, place, and time. Mental status is at baseline.     Cranial Nerves: No cranial nerve deficit.     Sensory: No sensory deficit.     Motor: No weakness.     Gait: Gait normal.  Psychiatric:        Mood and Affect: Mood normal.        Speech: Speech normal.        Behavior: Behavior normal.        Thought Content: Thought content normal.    BP 123/84   Pulse 62   Resp 16   Wt 249 lb 9.6 oz (113.2 kg)   SpO2 98%   BMI 40.29 kg/m  Wt Readings from Last 3 Encounters:  08/03/21 249 lb 9.6 oz (113.2 kg)  05/04/21 241 lb 12.8 oz (109.7 kg)  10/22/20 258 lb 9.6 oz (117.3 kg)     Health Maintenance Due  Topic Date Due   TETANUS/TDAP  Never done   COLON CANCER SCREENING ANNUAL FOBT  Never done   COVID-19 Vaccine (3 - Booster for Pfizer series) 04/01/2020    There are no preventive care reminders to display for this patient.  Lab Results  Component Value Date   TSH 1.010 04/22/2020   Lab Results  Component Value Date   WBC 6.7 05/04/2021   HGB 13.7 05/04/2021   HCT 42.9 05/04/2021   MCV 81 05/04/2021   PLT 333 05/04/2021   Lab  Results  Component Value Date   NA 139 05/04/2021   K 3.5 05/04/2021   CO2 27 05/04/2021   GLUCOSE 104 (H) 05/04/2021   BUN 8 05/04/2021   CREATININE 0.87 05/04/2021   BILITOT 0.5 05/04/2021   ALKPHOS 101 05/04/2021   AST 14 05/04/2021   ALT 13 05/04/2021   PROT 7.3 05/04/2021   ALBUMIN 4.1 05/04/2021   CALCIUM 9.3 05/04/2021    ANIONGAP 9 05/01/2020   EGFR 82 05/04/2021   Lab Results  Component Value Date   CHOL 202 (H) 04/22/2020   Lab Results  Component Value Date   HDL 51 04/22/2020   Lab Results  Component Value Date   LDLCALC 128 (H) 04/22/2020   Lab Results  Component Value Date   TRIG 128 04/22/2020   Lab Results  Component Value Date   CHOLHDL 4.0 04/22/2020   Lab Results  Component Value Date   HGBA1C 6.1 (H) 05/04/2021      Assessment & Plan:   Problem List Items Addressed This Visit       Cardiovascular and Mediastinum   Hypertension    Blood pressure under good control only on hydrochlorothiazide with normal blood pressure and dizziness we will hold further amlodipine and check metabolic panel        Digestive   Periodontal disease   Relevant Orders   Ambulatory referral to Dentistry     Other   Smoking history    Patient is no longer smoking since November      Increased severity of headaches - Primary    Given increased headaches recent history of hypertension now normotensive will obtain CT of the head and obtain neurology referral      Relevant Orders   CT HEAD WO CONTRAST (5MM)   Basic Metabolic Panel   Ambulatory referral to Neurology   Vision changes    Recommended complete eye exam given I resources      Prediabetes    Recheck Z6X and metabolic panel given recent steroid pulse      Relevant Orders   Basic Metabolic Panel   Hemoglobin A1c   Other Visit Diagnoses     Dizziness           No orders of the defined types were placed in this encounter.   Follow-up: Return in about 2 months (around 10/04/2021).    [name], MD

## 2021-08-03 NOTE — Patient Instructions (Signed)
CT of head was ordered  Referral to neurology was made  Referral to dentistry made  Go to [facility] for eye exam  Stop amlodipine  Labs today  Return Dr [name] 2 months

## 2021-08-03 NOTE — Assessment & Plan Note (Signed)
Blood pressure under good control only on hydrochlorothiazide with normal blood pressure and dizziness we will hold further amlodipine and check metabolic panel

## 2021-08-03 NOTE — Assessment & Plan Note (Signed)
Recommended complete eye exam given I resources

## 2021-08-03 NOTE — Assessment & Plan Note (Signed)
Given increased headaches recent history of hypertension now normotensive will obtain CT of the head and obtain neurology referral

## 2021-08-03 NOTE — Telephone Encounter (Signed)
Requested Prescriptions  Pending Prescriptions Disp Refills   albuterol (VENTOLIN HFA) 108 (90 Base) MCG/ACT inhaler 18 g 1    Sig: Inhale 1-2 puffs into the lungs every 6 (six) hours as needed for wheezing or shortness of breath.     Pulmonology:  Beta Agonists Failed - 08/03/2021  9:20 AM      Failed - One inhaler should last at least one month. If the patient is requesting refills earlier, contact the patient to check for uncontrolled symptoms.      Passed - Valid encounter within last 12 months    Recent Outpatient Visits          Today Increased severity of headaches   [place], MD   3 months ago Essential hypertension   [place], MD   6 months ago Mild intermittent asthma without complication   [place], Dionne Bucy, [place]   1 year ago Essential hypertension   [place], MD      Future Appointments            In 2 months Elsie Stain, MD [place]

## 2021-08-04 ENCOUNTER — Telehealth: Payer: Self-pay

## 2021-08-04 LAB — BASIC METABOLIC PANEL
BUN/Creatinine Ratio: 18 (ref 9–23)
BUN: 16 mg/dL (ref 6–24)
CO2: 25 mmol/L (ref 20–29)
Calcium: 9.2 mg/dL (ref 8.7–10.2)
Chloride: 102 mmol/L (ref 96–106)
Creatinine, Ser: 0.9 mg/dL (ref 0.57–1.00)
Glucose: 98 mg/dL (ref 70–99)
Potassium: 3.9 mmol/L (ref 3.5–5.2)
Sodium: 140 mmol/L (ref 134–144)
eGFR: 79 mL/min/{1.73_m2} (ref >59)

## 2021-08-04 LAB — HEMOGLOBIN A1C
Est. average glucose Bld gHb Est-mCnc: 131 mg/dL
Hgb A1c MFr Bld: 6.2 % — ABNORMAL HIGH (ref 4.8–5.6)

## 2021-08-04 NOTE — Telephone Encounter (Signed)
-----   Message from Elsie Stain, MD sent at 08/04/2021  6:46 AM EST ----- Let pt know labs normal  A1C is good  prediabetes no need for medication  focus on healthy diet

## 2021-08-04 NOTE — Telephone Encounter (Signed)
Pt was called and vm was left, Information has been sent to nurse pool.   

## 2021-08-10 ENCOUNTER — Other Ambulatory Visit: Payer: Self-pay

## 2021-08-11 ENCOUNTER — Other Ambulatory Visit: Payer: Self-pay

## 2021-08-11 MED FILL — Albuterol Sulfate Soln Nebu 0.083% (2.5 MG/3ML): RESPIRATORY_TRACT | 12 days supply | Qty: 150 | Fill #0 | Status: AC

## 2021-08-12 ENCOUNTER — Ambulatory Visit (HOSPITAL_COMMUNITY): Admission: RE | Admit: 2021-08-12 | Payer: Self-pay | Source: Ambulatory Visit

## 2021-08-24 ENCOUNTER — Ambulatory Visit: Payer: Self-pay

## 2021-09-14 ENCOUNTER — Other Ambulatory Visit: Payer: Self-pay | Admitting: Critical Care Medicine

## 2021-09-15 ENCOUNTER — Other Ambulatory Visit: Payer: Self-pay

## 2021-09-15 ENCOUNTER — Other Ambulatory Visit (HOSPITAL_COMMUNITY): Payer: Self-pay

## 2021-09-15 MED ORDER — CETIRIZINE HCL 10 MG PO TABS
10.0000 mg | ORAL_TABLET | Freq: Every day | ORAL | 2 refills | Status: DC
  Filled 2021-09-15 – 2022-02-14 (×4): qty 30, 30d supply, fill #0
  Filled 2022-03-14: qty 30, 30d supply, fill #1
  Filled 2022-05-12: qty 30, 30d supply, fill #2

## 2021-09-15 NOTE — Telephone Encounter (Signed)
Requested Prescriptions  Pending Prescriptions Disp Refills   cetirizine (ZYRTEC) 10 MG tablet 30 tablet 2    Sig: Take 1 tablet (10 mg total) by mouth daily.     Ear, Nose, and Throat:  Antihistamines Passed - 09/14/2021  5:36 PM      Passed - Valid encounter within last 12 months    Recent Outpatient Visits          1 month ago Increased severity of headaches   [place], MD   4 months ago Essential hypertension   [place], MD   7 months ago Mild intermittent asthma without complication   [place], Dionne Bucy, [place]   1 year ago Essential hypertension   [place], MD      Future Appointments            In 2 weeks Elsie Stain, MD [place]

## 2021-09-22 ENCOUNTER — Other Ambulatory Visit: Payer: Self-pay

## 2021-10-01 ENCOUNTER — Telehealth: Payer: Self-pay

## 2021-10-01 MED ORDER — MEGESTROL ACETATE 40 MG PO TABS
40.0000 mg | ORAL_TABLET | Freq: Two times a day (BID) | ORAL | 3 refills | Status: DC
  Filled 2021-10-08 – 2021-11-15 (×3): qty 60, 30d supply, fill #0

## 2021-10-01 NOTE — Telephone Encounter (Signed)
Pt call transferred from the front office.   Pt reports that she has been having a lot of vaginal bleeding and has been light headed.  I asked pt about her IUD that was placed in 06/2020.  Pt reports that she was having a lot of clots causing the IUD to be expelled.  Pt states that she had a discussion about a hysterectomy however at the time she did not have insurance.   Pt reports that she currently has [facility] financial assistance and is wanting something to stop her bleeding until her appt.  Reviewed chart with Dr. Elgie Congo who recommends that pt take Megace 40 mg po bid.  Notified of prescription, how to take it, and that it has been sent to her [place] on [address].  Pt verbalized understanding.   Frances Nickels  10/01/21

## 2021-10-03 NOTE — Progress Notes (Incomplete)
Established Patient Office Visit  Subjective:  Patient ID: Angela Burton, female    DOB: 1971/07/28  Age: 51 y.o. MRN: [id number]  CC: No chief complaint on file.   HPI Angela Burton presents for *** Hypertension       Blood pressure under good control only on hydrochlorothiazide with normal blood pressure and dizziness we will hold further amlodipine and check metabolic panel            Digestive    Periodontal disease    Relevant Orders    Ambulatory referral to Dentistry        Other    Smoking history      Patient is no longer smoking since November        Increased severity of headaches - Primary      Given increased headaches recent history of hypertension now normotensive will obtain CT of the head and obtain neurology referral        Relevant Orders    CT HEAD WO CONTRAST (5MM)    Basic Metabolic Panel    Ambulatory referral to Neurology    Vision changes      Recommended complete eye exam given I resources        Prediabetes      Recheck P9X and metabolic panel given recent steroid pulse        Relevant Orders    Basic Metabolic Panel    Hemoglobin A1c    Other Visit Diagnoses       Dizziness             Past Medical History:  Diagnosis Date   Asthma    albuterol   Hypertension    Obesity     Past Surgical History:  Procedure Laterality Date   DILATION AND CURETTAGE OF UTERUS     HYSTEROSCOPY WITH D & C N/A 05/05/2020   Procedure: DILATATION AND CURETTAGE /HYSTEROSCOPY AND IUD REMOVAL;  Surgeon: [name], MD;  Location: [place];  Service: Gynecology;  Laterality: N/A;   LAPAROSCOPIC OVARIAN CYSTECTOMY Right 05/05/2020   Procedure: OPERATIVE LAPAROSCOPY AND LAPAROSCOPIC OVARIAN CYSTECTOMY;  Surgeon: [name], MD;  Location: [facility];  Service: Gynecology;  Laterality: Right;   TUBAL LIGATION      Family History  Problem Relation Age of Onset   Heart attack Mother    Diabetes  Father    Hypertension Father    High Cholesterol Father    Breast cancer Sister    HIV Sister     Social History   Socioeconomic History   Marital status: Legally Separated    Spouse name: Not on file   Number of children: Not on file   Years of education: Not on file   Highest education level: Not on file  Occupational History   Not on file  Tobacco Use   Smoking status: Former    Packs/day: 0.15    Types: Cigarettes    Quit date: 07/07/2021    Years since quitting: 0.2   Smokeless tobacco: Never   Tobacco comments:    3 cigarettes/2 wks  Vaping Use   Vaping Use: Never used  Substance and Sexual Activity   Alcohol use: Yes    Alcohol/week: 2.0 standard drinks    Types: 2 Glasses of wine per week    Comment: rarely   Drug use: Yes    Types: Marijuana    Comment: sometimes    Sexual activity: Yes  Birth control/protection: I.U.D., Surgical  Other Topics Concern   Not on file  Social History Narrative   Not on file   Social Determinants of Health   Financial Resource Strain: Not on file  Food Insecurity: Food Insecurity Present   Worried About Charity fundraiser in the Last Year: Sometimes true   Ran Out of Food in the Last Year: Sometimes true  Transportation Needs: Not on file  Physical Activity: Not on file  Stress: Not on file  Social Connections: Not on file  Intimate Partner Violence: Not on file    Outpatient Medications Prior to Visit  Medication Sig Dispense Refill   albuterol (PROVENTIL) (2.5 MG/3ML) 0.083% nebulizer solution USE 1 VIAL VIA NEBULIZER EVERY 6 HOURS AS NEEDED FOR WHEEZING OR SHORTNESS OF BREATH 150 mL 0   albuterol (VENTOLIN HFA) 108 (90 Base) MCG/ACT inhaler Inhale 1-2 puffs into the lungs every 6 (six) hours as needed for wheezing or shortness of breath. 18 g 1   cetirizine (ZYRTEC) 10 MG tablet Take 1 tablet (10 mg total) by mouth daily. 30 tablet 2   fluticasone (FLOVENT HFA) 110 MCG/ACT inhaler Inhale  2 puffs into the lungs 2 (two) times daily. 16 g 12   hydrochlorothiazide (HYDRODIURIL) 25 MG tablet TAKE 1 TABLET(25 MG) BY MOUTH DAILY 60 tablet 4   megestrol (MEGACE) 40 MG tablet Take 1 tablet (40 mg total) by mouth 2 (two) times daily. 60 tablet 3   No facility-administered medications prior to visit.    Allergies  Allergen Reactions   Sulfa Antibiotics Anaphylaxis, Hives, Swelling and Rash   Aspirin Hives   Erythromycin Other (See Comments)    Had a seizure.     ROS Review of Systems    Objective:    Physical Exam  There were no vitals taken for this visit. Wt Readings from Last 3 Encounters:  08/03/21 249 lb 9.6 oz (113.2 kg)  05/04/21 241 lb 12.8 oz (109.7 kg)  10/22/20 258 lb 9.6 oz (117.3 kg)     Health Maintenance Due  Topic Date Due   TETANUS/TDAP  Never done   COLON CANCER SCREENING ANNUAL FOBT  Never done   COVID-19 Vaccine (3 - Booster for Pfizer series) 04/01/2020    There are no preventive care reminders to display for this patient.  Lab Results  Component Value Date   TSH 1.010 04/22/2020   Lab Results  Component Value Date   WBC 6.7 05/04/2021   HGB 13.7 05/04/2021   HCT 42.9 05/04/2021   MCV 81 05/04/2021   PLT 333 05/04/2021   Lab Results  Component Value Date   NA 140 08/03/2021   K 3.9 08/03/2021   CO2 25 08/03/2021   GLUCOSE 98 08/03/2021   BUN 16 08/03/2021   CREATININE 0.90 08/03/2021   BILITOT 0.5 05/04/2021   ALKPHOS 101 05/04/2021   AST 14 05/04/2021   ALT 13 05/04/2021   PROT 7.3 05/04/2021   ALBUMIN 4.1 05/04/2021   CALCIUM 9.2 08/03/2021   ANIONGAP 9 05/01/2020   EGFR 79 08/03/2021   Lab Results  Component Value Date   CHOL 202 (H) 04/22/2020   Lab Results  Component Value Date   HDL 51 04/22/2020   Lab Results  Component Value Date   LDLCALC 128 (H) 04/22/2020   Lab Results  Component Value Date   TRIG 128 04/22/2020   Lab Results  Component Value Date   CHOLHDL 4.0 04/22/2020   Lab  Results  Component  Value Date   HGBA1C 6.2 (H) 08/03/2021      Assessment & Plan:   Problem List Items Addressed This Visit   None   No orders of the defined types were placed in this encounter.   Follow-up: No follow-ups on file.    [name], MD

## 2021-10-04 ENCOUNTER — Ambulatory Visit: Payer: Self-pay | Admitting: Critical Care Medicine

## 2021-10-04 ENCOUNTER — Other Ambulatory Visit: Payer: Self-pay | Admitting: Physician Assistant

## 2021-10-04 DIAGNOSIS — J4520 Mild intermittent asthma, uncomplicated: Secondary | ICD-10-CM

## 2021-10-05 ENCOUNTER — Other Ambulatory Visit: Payer: Self-pay | Admitting: Critical Care Medicine

## 2021-10-05 ENCOUNTER — Other Ambulatory Visit: Payer: Self-pay

## 2021-10-05 ENCOUNTER — Other Ambulatory Visit: Payer: Self-pay | Admitting: Physician Assistant

## 2021-10-05 DIAGNOSIS — I10 Essential (primary) hypertension: Secondary | ICD-10-CM

## 2021-10-05 DIAGNOSIS — J4520 Mild intermittent asthma, uncomplicated: Secondary | ICD-10-CM

## 2021-10-05 MED ORDER — ALBUTEROL SULFATE (2.5 MG/3ML) 0.083% IN NEBU
INHALATION_SOLUTION | RESPIRATORY_TRACT | 0 refills | Status: DC
  Filled 2021-10-05: qty 150, 37d supply, fill #0

## 2021-10-05 NOTE — Telephone Encounter (Signed)
HCTZ refilled 05/04/2021 #60 4 refills should last until 02/2022. Centerizine refilled 09/15/2021 #302 refills should last until 12/09/21. Requested Prescriptions  Pending Prescriptions Disp Refills   hydrochlorothiazide (HYDRODIURIL) 25 MG tablet 60 tablet 4    Sig: TAKE 1 TABLET(25 MG) BY MOUTH DAILY     Cardiovascular: Diuretics - Thiazide Passed - 10/05/2021 10:10 AM      Passed - Cr in normal range and within 180 days    Creatinine, Ser  Date Value Ref Range Status  08/03/2021 0.90 0.57 - 1.00 mg/dL Final         Passed - K in normal range and within 180 days    Potassium  Date Value Ref Range Status  08/03/2021 3.9 3.5 - 5.2 mmol/L Final         Passed - Na in normal range and within 180 days    Sodium  Date Value Ref Range Status  08/03/2021 140 134 - 144 mmol/L Final         Passed - Last BP in normal range    BP Readings from Last 1 Encounters:  08/03/21 123/84         Passed - Valid encounter within last 6 months    Recent Outpatient Visits          2 months ago Increased severity of headaches   [place], MD   5 months ago Essential hypertension   [place], MD   8 months ago Mild intermittent asthma without complication   [place], [place], [place]   1 year ago Essential hypertension   [place] Elsie Stain, MD              cetirizine (ZYRTEC) 10 MG tablet 30 tablet 2    Sig: Take 1 tablet (10 mg total) by mouth daily.     Ear, Nose, and Throat:  Antihistamines 2 Passed - 10/05/2021 10:10 AM      Passed - Cr in normal range and within 360 days    Creatinine, Ser  Date Value Ref Range Status  08/03/2021 0.90 0.57 - 1.00 mg/dL Final         Passed - Valid encounter within last 12 months    Recent Outpatient Visits          2 months ago Increased severity of headaches   [place], MD   5 months ago Essential hypertension   [place] Elsie Stain, MD   8 months ago Mild intermittent asthma without complication   [place], Dionne Bucy, [place]   1 year ago Essential hypertension   [place] Elsie Stain, MD

## 2021-10-05 NOTE — Telephone Encounter (Signed)
Requested Prescriptions  Pending Prescriptions Disp Refills   albuterol (PROVENTIL) (2.5 MG/3ML) 0.083% nebulizer solution 150 mL 0    Sig: USE 1 VIAL VIA NEBULIZER EVERY 6 HOURS AS NEEDED FOR WHEEZING OR SHORTNESS OF BREATH     Pulmonology:  Beta Agonists 2 Passed - 10/05/2021 10:10 AM      Passed - Last BP in normal range    BP Readings from Last 1 Encounters:  08/03/21 123/84         Passed - Last Heart Rate in normal range    Pulse Readings from Last 1 Encounters:  08/03/21 62         Passed - Valid encounter within last 12 months    Recent Outpatient Visits          2 months ago Increased severity of headaches   [place], MD   5 months ago Essential hypertension   [place], MD   8 months ago Mild intermittent asthma without complication   [place], Dionne Bucy, [place]   1 year ago Essential hypertension   [place] Elsie Stain, MD

## 2021-10-06 ENCOUNTER — Other Ambulatory Visit: Payer: Self-pay

## 2021-10-06 ENCOUNTER — Ambulatory Visit: Payer: Self-pay | Admitting: Student

## 2021-10-07 ENCOUNTER — Other Ambulatory Visit: Payer: Self-pay

## 2021-10-07 ENCOUNTER — Other Ambulatory Visit (HOSPITAL_COMMUNITY): Payer: Self-pay

## 2021-10-08 ENCOUNTER — Other Ambulatory Visit: Payer: Self-pay

## 2021-10-15 ENCOUNTER — Other Ambulatory Visit: Payer: Self-pay

## 2021-11-01 ENCOUNTER — Other Ambulatory Visit: Payer: Self-pay

## 2021-11-04 ENCOUNTER — Other Ambulatory Visit: Payer: Self-pay

## 2021-11-05 ENCOUNTER — Other Ambulatory Visit: Payer: Self-pay

## 2021-11-05 ENCOUNTER — Other Ambulatory Visit: Payer: Self-pay | Admitting: Critical Care Medicine

## 2021-11-05 DIAGNOSIS — J4520 Mild intermittent asthma, uncomplicated: Secondary | ICD-10-CM

## 2021-11-06 ENCOUNTER — Other Ambulatory Visit (HOSPITAL_COMMUNITY): Payer: Self-pay

## 2021-11-08 ENCOUNTER — Other Ambulatory Visit (HOSPITAL_COMMUNITY): Payer: Self-pay

## 2021-11-08 ENCOUNTER — Encounter: Payer: Self-pay | Admitting: *Deleted

## 2021-11-08 ENCOUNTER — Other Ambulatory Visit: Payer: Self-pay

## 2021-11-08 MED ORDER — ALBUTEROL SULFATE HFA 108 (90 BASE) MCG/ACT IN AERS
1.0000 | INHALATION_SPRAY | Freq: Four times a day (QID) | RESPIRATORY_TRACT | 0 refills | Status: DC | PRN
  Filled 2021-11-08 – 2021-11-15 (×3): qty 18, 25d supply, fill #0

## 2021-11-08 NOTE — Telephone Encounter (Signed)
Requested Prescriptions  ?Pending Prescriptions Disp Refills  ?? albuterol (VENTOLIN HFA) 108 (90 Base) MCG/ACT inhaler 18 g 0  ?  Sig: Inhale 1-2 puffs into the lungs every 6 (six) hours as needed for wheezing or shortness of breath.  ?  ? Pulmonology:  Beta Agonists 2 Passed - 11/05/2021  7:24 PM  ?  ?  Passed - Last BP in normal range  ?  BP Readings from Last 1 Encounters:  ?08/03/21 123/84  ?   ?  ?  Passed - Last Heart Rate in normal range  ?  Pulse Readings from Last 1 Encounters:  ?08/03/21 62  ?   ?  ?  Passed - Valid encounter within last 12 months  ?  Recent Outpatient Visits   ?      ? 3 months ago Increased severity of headaches  ? [place] Elsie Stain, MD  ? 6 months ago Essential hypertension  ? [place] Elsie Stain, MD  ? 9 months ago Mild intermittent asthma without complication  ? [place], [place], [place]  ? 1 year ago Essential hypertension  ? [place] Elsie Stain, MD  ?  ?  ? ?  ?  ?  ? ? ?

## 2021-11-15 ENCOUNTER — Other Ambulatory Visit: Payer: Self-pay

## 2021-11-15 ENCOUNTER — Other Ambulatory Visit: Payer: Self-pay | Admitting: Critical Care Medicine

## 2021-11-15 DIAGNOSIS — J4520 Mild intermittent asthma, uncomplicated: Secondary | ICD-10-CM

## 2021-11-16 ENCOUNTER — Other Ambulatory Visit: Payer: Self-pay

## 2021-11-16 ENCOUNTER — Other Ambulatory Visit (HOSPITAL_COMMUNITY): Payer: Self-pay

## 2021-11-17 ENCOUNTER — Other Ambulatory Visit: Payer: Self-pay

## 2021-11-17 MED ORDER — ALBUTEROL SULFATE (2.5 MG/3ML) 0.083% IN NEBU
INHALATION_SOLUTION | RESPIRATORY_TRACT | 0 refills | Status: DC
  Filled 2021-11-17 – 2021-11-19 (×2): qty 150, fill #0
  Filled 2021-12-02: qty 150, 12d supply, fill #0
  Filled 2021-12-22: qty 180, 15d supply, fill #0

## 2021-11-19 ENCOUNTER — Other Ambulatory Visit: Payer: Self-pay

## 2021-11-22 ENCOUNTER — Other Ambulatory Visit: Payer: Self-pay

## 2021-11-22 ENCOUNTER — Telehealth: Payer: Self-pay | Admitting: Pharmacist

## 2021-11-22 ENCOUNTER — Other Ambulatory Visit: Payer: Self-pay | Admitting: Pharmacist

## 2021-11-22 MED ORDER — FLUTICASONE-SALMETEROL 100-50 MCG/ACT IN AEPB
1.0000 | INHALATION_SPRAY | Freq: Two times a day (BID) | RESPIRATORY_TRACT | 2 refills | Status: DC
  Filled 2021-11-22 – 2022-01-19 (×2): qty 60, 30d supply, fill #0
  Filled 2022-02-14: qty 60, 30d supply, fill #1
  Filled 2022-03-14: qty 60, 30d supply, fill #2

## 2021-11-22 MED ORDER — BUDESONIDE-FORMOTEROL FUMARATE 80-4.5 MCG/ACT IN AERO
2.0000 | INHALATION_SPRAY | Freq: Two times a day (BID) | RESPIRATORY_TRACT | 1 refills | Status: DC
  Filled 2021-11-22: qty 10.2, 30d supply, fill #0

## 2021-11-22 NOTE — Addendum Note (Signed)
Addended byCharlott Rakes on: 11/22/2021 01:46 PM ? ? Modules accepted: Orders ? ?

## 2021-11-22 NOTE — Telephone Encounter (Signed)
Please inform her I have switched her to Symbicort due to difficulty obtaining nebulizer solution.  She will need to discontinue her Flovent. ?

## 2021-11-22 NOTE — Telephone Encounter (Signed)
Patient is requesting nebulizer refill, however, several neb solutions are on backorder. Will send to provider covering for Dr. [name]. We are having issues with single albuterol and with Duoneb. Could consider LABA/ICS combination.  ?

## 2021-11-29 ENCOUNTER — Other Ambulatory Visit: Payer: Self-pay

## 2021-12-02 ENCOUNTER — Other Ambulatory Visit: Payer: Self-pay

## 2021-12-08 ENCOUNTER — Other Ambulatory Visit: Payer: Self-pay

## 2021-12-15 ENCOUNTER — Ambulatory Visit (INDEPENDENT_AMBULATORY_CARE_PROVIDER_SITE_OTHER): Payer: Self-pay | Admitting: Obstetrics and Gynecology

## 2021-12-15 ENCOUNTER — Encounter: Payer: Self-pay | Admitting: Obstetrics and Gynecology

## 2021-12-15 VITALS — BP 149/81 | HR 76 | Ht 66.0 in | Wt 253.9 lb

## 2021-12-15 DIAGNOSIS — D251 Intramural leiomyoma of uterus: Secondary | ICD-10-CM

## 2021-12-15 DIAGNOSIS — N926 Irregular menstruation, unspecified: Secondary | ICD-10-CM

## 2021-12-15 MED ORDER — MEGESTROL ACETATE 40 MG PO TABS
40.0000 mg | ORAL_TABLET | Freq: Two times a day (BID) | ORAL | 3 refills | Status: DC
  Filled 2021-12-24: qty 60, 30d supply, fill #0
  Filled 2022-01-19: qty 60, 30d supply, fill #1

## 2021-12-15 NOTE — Progress Notes (Signed)
?  CC: fibroid, irregular bleeding ?Subjective:  ? ? Patient ID: Angela Burton, female    DOB: [birth year], 51 y.o.   MRN: [id number] ? ?HPI ?51 yo G3P2 seen for discussion of further treatment of fibroid uterus and irregular heavy bleeding.  Pt states bleeding has improved with the use of megace, but she desires alternative/definitive therapy.  Pt has known 9 cm central fibroid.  Discussed treatment options including UFE or hysterectomy.  Pt has had SVD x 2 with delivery of 7 pound infant.  Pt desires hysterectomy. ? ? ?Review of Systems ? ?   ?Objective:  ? Physical Exam ?Vitals:  ? 12/15/21 1501  ?BP: (!) 149/81  ?Pulse: 76  ?SSE: cvx easily visualized, somewhat high in the pelvis ?SVE: enlarged uterus 11-12 week size, no major subserosal fibroids palpated ? ? ? ? ?   ?Assessment & Plan:  ? ?1. Irregular menses ?Refill megace for bleeding control until definitive therapy  completed ? ?- megestrol (MEGACE) 40 MG tablet; Take 1 tablet (40 mg total) by mouth 2 (two) times daily.  Dispense: 60 tablet; Refill: 3 ? ?2. Intramural leiomyoma of uterus ?Discussed pt with Dr. Rip Harbour with last u/s findings.  Pt may be candidate for TVH.  She desires surgery after beginning of August due to decent bleeding control and summer vacation plans. ?Will send patient to Dr. Rip Harbour for surgical consultation and may consider depo Lupron  to possibly shrink the uterine size prior to surgery ? ?Moderate decision making ? ?[name], MD ?Faculty Attending, Center for [facility]  ?

## 2021-12-15 NOTE — Progress Notes (Signed)
Pt states that bleeding has calmed down and she is only bleeding for 2 weeks instead of whole month.  ?

## 2021-12-22 ENCOUNTER — Other Ambulatory Visit: Payer: Self-pay

## 2021-12-23 ENCOUNTER — Other Ambulatory Visit: Payer: Self-pay

## 2021-12-24 ENCOUNTER — Other Ambulatory Visit: Payer: Self-pay

## 2022-01-19 ENCOUNTER — Other Ambulatory Visit: Payer: Self-pay | Admitting: Critical Care Medicine

## 2022-01-19 ENCOUNTER — Other Ambulatory Visit: Payer: Self-pay

## 2022-01-19 DIAGNOSIS — J4520 Mild intermittent asthma, uncomplicated: Secondary | ICD-10-CM

## 2022-01-19 MED ORDER — ALBUTEROL SULFATE (2.5 MG/3ML) 0.083% IN NEBU
INHALATION_SOLUTION | RESPIRATORY_TRACT | 0 refills | Status: DC
  Filled 2022-01-19: qty 180, 15d supply, fill #0

## 2022-01-20 ENCOUNTER — Encounter: Payer: Self-pay | Admitting: Obstetrics and Gynecology

## 2022-01-20 ENCOUNTER — Ambulatory Visit (INDEPENDENT_AMBULATORY_CARE_PROVIDER_SITE_OTHER): Payer: Self-pay | Admitting: Obstetrics and Gynecology

## 2022-01-20 ENCOUNTER — Other Ambulatory Visit: Payer: Self-pay

## 2022-01-20 VITALS — BP 172/112 | HR 76 | Ht 64.0 in | Wt 262.9 lb

## 2022-01-20 DIAGNOSIS — D259 Leiomyoma of uterus, unspecified: Secondary | ICD-10-CM

## 2022-01-20 DIAGNOSIS — N926 Irregular menstruation, unspecified: Secondary | ICD-10-CM

## 2022-01-20 MED ORDER — MEGESTROL ACETATE 40 MG PO TABS
40.0000 mg | ORAL_TABLET | Freq: Two times a day (BID) | ORAL | 3 refills | Status: DC
Start: 2022-01-20 — End: 2022-06-01
  Filled 2022-03-14: qty 60, 30d supply, fill #0
  Filled 2022-05-12: qty 60, 30d supply, fill #1

## 2022-01-20 NOTE — Progress Notes (Signed)
Angela Burton presents for eval of hyst due to [facility] and uterine fibroids.  Pt with uterine fibroids by GYN U/S 2/22. Has tried IUD x 2 with expelled each. H/O TSVD x 2,  (largest 7#) Pap smear UTP  PE AF VSS Chaperone present  Lungs clear Heart RRR Abd soft + BS GU nl EGBUS, uterus enlarged @ 13 weeks, mobile, non tender  A/P Uterine fibroids        [facility]  Pt desires definite therapy. TVH with BS reviewed with pt. R/B/Post op care reviewed. Information provided to pt. Pt verbalized understanding. Will f/u with post op appt.

## 2022-01-20 NOTE — Patient Instructions (Signed)
Vaginal Hysterectomy, Care After The following information offers guidance on how to care for yourself after your procedure. Your health care provider may also give you more specific instructions. If you have problems or questions, contact your health care provider. What can I expect after the procedure? After the procedure, it is common to have: Pain in the lower abdomen and vagina. Vaginal bleeding and discharge for up to 1 week. You will need to use a sanitary pad after this procedure. Difficulty having a bowel movement (constipation). Temporary problems emptying the bladder. Tiredness (fatigue). Poor appetite. Less interest in sex. Feelings of sadness or other emotions. If your ovaries were also removed, it is also common to have symptoms of menopause, such as hot flashes, night sweats, and lack of sleep (insomnia). Follow these instructions at home: Medicines  Take over-the-counter and prescription medicines only as told by your health care provider. Do not take aspirin or NSAIDs, such as ibuprofen. These medicines can cause bleeding. Ask your health care provider if the medicine prescribed to you: Requires you to avoid driving or using machinery. Can cause constipation. You may need to take these actions to prevent or treat constipation: Drink enough fluid to keep your urine pale yellow. Take over-the-counter or prescription medicines. Eat foods that are high in fiber, such as beans, whole grains, and fresh fruits and vegetables. Limit foods that are high in fat and processed sugars, such as fried or sweet foods. Activity  Rest as told by your health care provider. Return to your normal activities as told by your health care provider. Ask your health care provider what activities are safe for you Avoid sitting for a long time without moving. Get up to take short walks every 1-2 hours. This is important to improve blood flow and breathing. Ask for help if you feel weak or  unsteady. Try to have someone home with you for 1-2 weeks to help you with everyday chores. Do not lift anything that is heavier than 10 lb (4.5 kg), or the limit that you are told, until your health care provider says that it is safe. If you were given a sedative during the procedure, it can affect you for several hours. Do not drive or operate machinery until your health care provider says that it is safe. Lifestyle Do not use any products that contain nicotine or tobacco. These products include cigarettes, chewing tobacco, and vaping devices, such as e-cigarettes. These can delay healing after surgery. If you need help quitting, ask your health care provider. Do not drink alcohol until your health care provider approves. General instructions Do not douche, use tampons, or have sex for at least 6 weeks, or as told by your health care provider. If you struggle with physical or emotional changes after your procedure, speak with your health care provider or a therapist. The stitches inside your vagina will dissolve over time and do not need to be taken out. Do not take baths, swim, or use a hot tub until your health care provider approves. You may only be allowed to take showers for 2-3 weeks Wear compression stockings as told by your health care provider. These stockings help to prevent blood clots and reduce swelling in your legs. Keep all follow-up visits. This is important. Contact a health care provider if: Your pain medicine is not helping. You have a fever. You have nausea or vomiting that does not go away. You feel dizzy. You have blood, pus, or a bad-smelling discharge from your vagina   more than 1 week after the procedure. You continue to have trouble urinating 3-5 days after the procedure. Get help right away if: You have severe pain in your abdomen or back. You faint. You have heavy vaginal bleeding and blood clots, soaking through a sanitary pad in less than 1 hour. You have chest  pain or shortness of breath. You have pain, swelling, or redness in your leg. These symptoms may represent a serious problem that is an emergency. Do not wait to see if the symptoms will go away. Get medical help right away. Call your local emergency services (911 in the [place] Do not drive yourself to the hospital. Summary After the procedure, it is common to have pain, vaginal bleeding, constipation, temporary problems emptying your bladder, and feelings of sadness or other emotions. Take over-the-counter and prescription medicines only as told by your health care provider. Rest as told by your health care provider. Return to your normal activities as told by your health care provider. Contact a health care provider if your pain medicine is not helping, or you have a fever, dizziness, or trouble urinating several days after the procedure. Get help right away if you have severe pain in your abdomen or back, or if you faint, have heavy bleeding, or have chest pain or shortness of breath. This information is not intended to replace advice given to you by your health care provider. Make sure you discuss any questions you have with your health care provider. Document Revised: 04/10/2020 Document Reviewed: 04/10/2020 Elsevier Patient Education  2023 Elsevier Inc. Vaginal Hysterectomy  A vaginal hysterectomy is a procedure to remove all or part of the uterus through a small incision in the vagina. In this procedure, your health care provider may remove your entire uterus, including the cervix. The cervix is the opening and bottom part of the uterus and is located between the vagina and the uterus. Sometimes, the ovaries and fallopian tubes are also removed. This surgery may be done to treat problems such as: Noncancerous growths in the uterus (uterine fibroids) that cause symptoms. A condition that causes the lining of the uterus to grow in other areas (endometriosis). Problems with pelvic  support. Cancer of the cervix, ovaries, uterus, or tissue that lines the uterus (endometrium). Excessive bleeding in the uterus. When removing your uterus, your health care provider may also remove the ovaries and the fallopian tubes. After this procedure, you will no longer be able to have a baby, and you will no longer have a menstrual period. Tell a health care provider about: Any allergies you have. All medicines you are taking, including vitamins, herbs, eye drops, creams, and over-the-counter medicines. Any problems you or family members have had with anesthetic medicines. Any blood disorders you have. Any surgeries you have had. Any medical conditions you have. Whether you are pregnant or may be pregnant. What are the risks? Generally, this is a safe procedure. However, problems may occur, including: Bleeding. Infection. Blood clots in the legs or lungs. Damage to nearby structures or organs. Pain during sex. Allergic reactions to medicines. What happens before the procedure? Staying hydrated Follow instructions from your health care provider about hydration, which may include: Up to 2 hours before the procedure - you may continue to drink clear liquids, such as water, clear fruit juice, black coffee, and plain tea.  Eating and drinking restrictions Follow instructions from your health care provider about eating and drinking, which may include: 8 hours before the procedure - stop eating   heavy meals or foods, such as meat, fried foods, or fatty foods. 6 hours before the procedure - stop eating light meals or foods, such as toast or cereal. 6 hours before the procedure - stop drinking milk or drinks that contain milk. 2 hours before the procedure - stop drinking clear liquids. Medicines Ask your health care provider about: Changing or stopping your regular medicines. This is especially important if you are taking diabetes medicines or blood thinners. Taking medicines such as  aspirin and ibuprofen. These medicines can thin your blood. Do not take these medicines unless your health care provider tells you to take them. Taking over-the-counter medicines, vitamins, herbs, and supplements. You may be asked to take a medicine to empty your colon (bowel preparation). General instructions If you were asked to do a bowel preparation before the procedure, follow instructions from your health care provider. This procedure can affect the way you feel about yourself. Talk with your health care provider about the physical and emotional changes hysterectomy may cause. Do not use any products that contain nicotine or tobacco for at least 4 weeks before the procedure. These products include cigarettes, e-cigarettes, and chewing tobacco. If you need help quitting, ask your health care provider. Plan to have a responsible adult take you home from the hospital or clinic. Plan to have a responsible adult care for you for the time you are told after you leave the hospital or clinic. This is important. Surgery safety Ask your health care provider: How your surgery site will be marked. What steps will be taken to help prevent infection. These may include: Removing hair at the surgery site. Washing skin with a germ-killing soap. Receiving antibiotic medicine. What happens during the procedure? An IV will be inserted into one of your veins. You will be given one or more of the following: A medicine to help you relax (sedative). A medicine to numb the area (local anesthetic). A medicine to make you fall asleep (general anesthetic). A medicine that is injected into your spine to numb the area below and slightly above the injection site (spinal anesthetic). A medicine that is injected into an area of your body to numb everything below the injection site (regional anesthetic). Your surgeon will make an incision in your vagina. Your surgeon will locate and remove all or part of your uterus.  Part or all of the uterus will be removed through the vagina. Your ovaries and fallopian tubes may be removed at the same time. The incision in your vagina will be closed with stitches (sutures) that dissolve over time. The procedure may vary among health care providers and hospitals. What happens after the procedure? Your blood pressure, heart rate, breathing rate, and blood oxygen level will be monitored until you leave the hospital or clinic. You will be encouraged to walk as soon as possible. You will also use a device or do breathing exercises to keep your lungs clear. You may have to wear compression stockings. These stockings help to prevent blood clots and reduce swelling in your legs. You will be given pain medicine as needed. You will need to wear a sanitary pad for vaginal discharge or bleeding. Summary A vaginal hysterectomy is a procedure to remove all or part of the uterus through the vagina. You may need a vaginal hysterectomy to treat a variety of abnormalities of the uterus. Plan to have a responsible adult take you home from the hospital or clinic. Plan to have a responsible adult care for you   for the time you are told after you leave the hospital or clinic. This is important. This information is not intended to replace advice given to you by your health care provider. Make sure you discuss any questions you have with your health care provider. Document Revised: 04/10/2020 Document Reviewed: 04/10/2020 Elsevier Patient Education  2023 Elsevier Inc.  

## 2022-01-20 NOTE — Progress Notes (Signed)
Hysterectomy Statement signed today.

## 2022-01-27 ENCOUNTER — Telehealth: Payer: Self-pay

## 2022-01-27 NOTE — Telephone Encounter (Signed)
Patient called and stated that surgery date does not work for her, advised her of Dr. Marjory Lies next available date, rescheduled on 03/29/2022

## 2022-02-04 ENCOUNTER — Telehealth: Payer: Self-pay | Admitting: Pharmacist

## 2022-02-04 NOTE — Telephone Encounter (Signed)
Patient attempted to be outreached by Camila Li Day, PharmD Candidate on 02/04/22 to discuss hypertension. Left voicemail for patient to return our call at her convenience.     Catie Hedwig Morton, PharmD, [facility] [phone]

## 2022-02-14 ENCOUNTER — Other Ambulatory Visit: Payer: Self-pay | Admitting: Internal Medicine

## 2022-02-14 DIAGNOSIS — J4520 Mild intermittent asthma, uncomplicated: Secondary | ICD-10-CM

## 2022-02-15 ENCOUNTER — Other Ambulatory Visit: Payer: Self-pay

## 2022-02-15 MED ORDER — ALBUTEROL SULFATE (2.5 MG/3ML) 0.083% IN NEBU
INHALATION_SOLUTION | RESPIRATORY_TRACT | 0 refills | Status: DC
  Filled 2022-02-15: qty 180, 15d supply, fill #0

## 2022-02-21 ENCOUNTER — Other Ambulatory Visit: Payer: Self-pay

## 2022-03-14 ENCOUNTER — Other Ambulatory Visit: Payer: Self-pay

## 2022-03-17 ENCOUNTER — Encounter: Payer: Self-pay | Admitting: Obstetrics and Gynecology

## 2022-03-17 ENCOUNTER — Other Ambulatory Visit: Payer: Self-pay

## 2022-03-17 ENCOUNTER — Ambulatory Visit (INDEPENDENT_AMBULATORY_CARE_PROVIDER_SITE_OTHER): Payer: Self-pay | Admitting: Obstetrics and Gynecology

## 2022-03-17 ENCOUNTER — Ambulatory Visit: Payer: Self-pay | Admitting: Obstetrics and Gynecology

## 2022-03-17 VITALS — BP 133/83 | HR 85 | Ht 66.0 in | Wt 258.7 lb

## 2022-03-17 DIAGNOSIS — D251 Intramural leiomyoma of uterus: Secondary | ICD-10-CM

## 2022-03-17 DIAGNOSIS — D250 Submucous leiomyoma of uterus: Secondary | ICD-10-CM

## 2022-03-17 DIAGNOSIS — Z01818 Encounter for other preprocedural examination: Secondary | ICD-10-CM

## 2022-03-17 DIAGNOSIS — N926 Irregular menstruation, unspecified: Secondary | ICD-10-CM

## 2022-03-17 NOTE — Progress Notes (Addendum)
OB/GYN Pre-Op History and Physical  Angela Burton is a 51 y.o. [id number] presenting for preoperative visit for TVH.  She has been given risks and benefits of the procedure including bleeding, infection, involvement of other organs including bladder and bowel.  She has no hx of abnl pap and will no longer need pap smears..       Past Medical History:  Diagnosis Date   Asthma    albuterol   Hypertension    Obesity     Past Surgical History:  Procedure Laterality Date   DILATION AND CURETTAGE OF UTERUS     HYSTEROSCOPY WITH D & C N/A 05/05/2020   Procedure: DILATATION AND CURETTAGE /HYSTEROSCOPY AND IUD REMOVAL;  Surgeon: [name], MD;  Location: [place];  Service: Gynecology;  Laterality: N/A;   LAPAROSCOPIC OVARIAN CYSTECTOMY Right 05/05/2020   Procedure: OPERATIVE LAPAROSCOPY AND LAPAROSCOPIC OVARIAN CYSTECTOMY;  Surgeon: [name], MD;  Location: [place];  Service: Gynecology;  Laterality: Right;   TUBAL LIGATION      OB History  Gravida Para Term Preterm AB Living  '3 2 2 '$ 0 1 2  SAB IAB Ectopic Multiple Live Births  0 1 0 0 2    # Outcome Date GA Lbr Len/2nd Weight Sex Delivery Anes PTL Lv  3 IAB           2 Term      Vag-Spont     1 Term      Vag-Spont       Social History   Socioeconomic History   Marital status: Legally Separated    Spouse name: Not on file   Number of children: Not on file   Years of education: Not on file   Highest education level: Not on file  Occupational History   Not on file  Tobacco Use   Smoking status: Former    Packs/day: 0.15    Types: Cigarettes    Quit date: 07/07/2021    Years since quitting: 0.6   Smokeless tobacco: Never   Tobacco comments:    3 cigarettes/2 wks  Vaping Use   Vaping Use: Never used  Substance and Sexual Activity   Alcohol use: Yes    Alcohol/week: 2.0 standard drinks of alcohol    Types: 2 Glasses of wine per week    Comment: rarely   Drug use: Yes     Types: Marijuana    Comment: sometimes    Sexual activity: Yes    Birth control/protection: I.U.D., Surgical  Other Topics Concern   Not on file  Social History Narrative   Not on file   Social Determinants of Health   Financial Resource Strain: Not on file  Food Insecurity: Food Insecurity Present (10/22/2020)   Hunger Vital Sign    Worried About [place] in the Last Year: Sometimes true    Ran Out of Food in the Last Year: Sometimes true  Transportation Needs: No Transportation Needs (08/04/2020)   PRAPARE - Hydrologist (Medical): No    Lack of Transportation (Non-Medical): No  Physical Activity: Not on file  Stress: Not on file  Social Connections: Not on file    Family History  Problem Relation Age of Onset   Heart attack Mother    Diabetes Father    Hypertension Father    High Cholesterol Father    Breast cancer Sister    HIV Sister     (Not  in a hospital admission)   Allergies  Allergen Reactions   Sulfa Antibiotics Anaphylaxis, Hives, Swelling and Rash   Aspirin Hives   Erythromycin Other (See Comments)    Had a seizure.     Review of Systems: Negative except for what is mentioned in HPI.     Physical Exam: BP 133/83   Pulse 85   Ht '5\' 6"'$  (1.676 m)   Wt 258 lb 11.2 oz (117.3 kg)   BMI 41.76 kg/m  CONSTITUTIONAL: Well-developed, obese, well-nourished female in no acute distress.  HENT:  Normocephalic, atraumatic, External right and left ear normal. Oropharynx is clear and moist EYES: Conjunctivae and EOM are normal.  NECK: Normal range of motion, supple, no masses SKIN: Skin is warm and dry. No rash noted. Not diaphoretic. No erythema. No pallor. [place]: Alert and oriented to person, place, and time. Normal reflexes, muscle tone coordination. No cranial nerve deficit noted. PSYCHIATRIC: Normal mood and affect. Normal behavior. Normal judgment and thought content. CARDIOVASCULAR: Normal heart rate noted,  regular rhythm RESPIRATORY: Effort and breath sounds normal, no problems with respiration noted ABDOMEN: Soft, nontender, nondistended,  PELVIC: cervix easily accessed, vagina WNL MUSCULOSKELETAL: Normal range of motion. No edema and no tenderness. 2+ distal pulses.   Pertinent Labs/Studies:   No results found for this or any previous visit (from the past 72 hour(s)).     Assessment and Plan :Angela Burton is a 51 y.o. [id number] here for preoperative visit.   Plan for total vaginal hysterectomy NPO Admission labs ordered VS per protocol    Lynnda Shields, M.D. Attending [place], [facility] for [employer], [place]

## 2022-03-25 ENCOUNTER — Emergency Department (HOSPITAL_COMMUNITY)
Admission: EM | Admit: 2022-03-25 | Discharge: 2022-03-25 | Discharge status: Against Medical Advice | Payer: Self-pay | Attending: Student | Admitting: Student

## 2022-03-25 ENCOUNTER — Emergency Department (HOSPITAL_COMMUNITY): Payer: Self-pay

## 2022-03-25 ENCOUNTER — Other Ambulatory Visit: Payer: Self-pay

## 2022-03-25 ENCOUNTER — Encounter (HOSPITAL_COMMUNITY): Payer: Self-pay

## 2022-03-25 ENCOUNTER — Ambulatory Visit: Payer: Self-pay | Admitting: *Deleted

## 2022-03-25 DIAGNOSIS — Z20822 Contact with and (suspected) exposure to covid-19: Secondary | ICD-10-CM | POA: Insufficient documentation

## 2022-03-25 DIAGNOSIS — R0602 Shortness of breath: Secondary | ICD-10-CM | POA: Insufficient documentation

## 2022-03-25 DIAGNOSIS — R5383 Other fatigue: Secondary | ICD-10-CM | POA: Insufficient documentation

## 2022-03-25 DIAGNOSIS — J029 Acute pharyngitis, unspecified: Secondary | ICD-10-CM | POA: Insufficient documentation

## 2022-03-25 DIAGNOSIS — Z5321 Procedure and treatment not carried out due to patient leaving prior to being seen by health care provider: Secondary | ICD-10-CM | POA: Insufficient documentation

## 2022-03-25 DIAGNOSIS — M791 Myalgia, unspecified site: Secondary | ICD-10-CM | POA: Insufficient documentation

## 2022-03-25 LAB — SARS CORONAVIRUS 2 BY RT PCR: SARS Coronavirus 2 by RT PCR: POSITIVE — AB

## 2022-03-25 NOTE — ED Provider Triage Note (Signed)
Emergency Medicine Provider Triage Evaluation Note  Angela Burton , a 51 y.o. female  was evaluated in triage.  Pt complains of sore throat. States she just returned from a cruise and other individuals have tested positive for covid. Also has lost of taste and smell, fatigue, body aches and feels short of breath.   Review of Systems  Positive:  Negative:   Physical Exam  BP (!) 157/108 (BP Location: Right Arm)   Pulse 85   Temp 98.8 F (37.1 C) (Oral)   Resp 18   SpO2 100%  Gen:   Awake, no distress, generally ill appearing  Resp:  Normal effort  MSK:   Moves extremities without difficulty  Other:  Cough   Medical Decision Making  Medically screening exam initiated at 2:53 PM.  Appropriate orders placed.  Angela Burton was informed that the remainder of the evaluation will be completed by another provider, this initial triage assessment does not replace that evaluation, and the importance of remaining in the ED until their evaluation is complete.     Mickie Hillier, PA-C 03/25/22 1456

## 2022-03-25 NOTE — Telephone Encounter (Signed)
  Chief Complaint: Positive Covid   coughing a lot, chest tightness and shortness of breath  Just returned from a cruise yesterday.   Symptoms: Headache, body aches, fatigue, vomited twice, constipated, coughing up brownish-yellow sputum. Frequency: Symptoms started Tues. While on the cruise ship.  Sore throat and body aches with chills then hot and cold. Pertinent Negatives: Patient denies diarrhea.   Disposition: '[x]'$ ED /'[]'$ Urgent Care (no appt availability in office) / '[]'$ Appointment(In office/virtual)/ '[]'$  [facility]/ '[]'$ Home Care/ '[]'$ Refused Recommended Disposition /'[]'$ [facility]/ '[]'$  Follow-up with PCP Additional Notes: Pt agreeable to going to the ED since very weak, chest tightness and short of breath.   "I drank very little water on the ship because it was $5 a bottle so I know I'm dehydrated".

## 2022-03-25 NOTE — ED Triage Notes (Signed)
Pt arrived POV from home c/o feeling like crap x3 days. Pt just got back from a cruise and states she cannot taste or smell, she has a sore throat.

## 2022-03-25 NOTE — Telephone Encounter (Signed)
Reason for Disposition  SEVERE or constant chest pain or pressure  (Exception: Mild central chest pain, present only when coughing.)    Has asthma.   Positive for Covid  Answer Assessment - Initial Assessment Questions 1. COVID-19 DIAGNOSIS: "How do you know that you have COVID?" (e.g., positive lab test or self-test, diagnosed by doctor or NP/PA, symptoms after exposure)I     I was on a cruise and got sick and I was positive for Covid.   2. COVID-19 EXPOSURE: "Was there any known exposure to COVID before the symptoms began?" CDC Definition of close contact: within 6 feet (2 meters) for a total of 15 minutes or more over a 24-hour period.      On cruise I'm for a hysterectomy Tues.    3. ONSET: "When did the COVID-19 symptoms start?"      Symptoms started Tues. 4. WORST SYMPTOM: "What is your worst symptom?" (e.g., cough, fever, shortness of breath, muscle aches)     Coughing and chest tightness 5. COUGH: "Do you have a cough?" If Yes, ask: "How bad is the cough?"       Yes coughing up mucus brown-yellow I do have asthma. 6. FEVER: "Do you have a fever?" If Yes, ask: "What is your temperature, how was it measured, and when did it start?"     Started with my chest getting tight.   My inhaler wasn't helping.   I have body aches, hot and cool so probably fever.   This was on the cruise.   I'm in bed so weak and no energy.  I have no taste. 7. RESPIRATORY STATUS: "Describe your breathing?" (e.g., normal; shortness of breath, wheezing, unable to speak)      Shortness of breath, chest tightness,  sore throat. I'm constipated.  I'm vomiting up everything I eat.   Once yesterday and once this morning. My legs are weak and wobbly.   I feel like I'm still on the ship.   I feel different. 8. BETTER-SAME-WORSE: "Are you getting better, staying the same or getting worse compared to yesterday?"  If getting worse, ask, "In what way?"     I feel worse. 9. OTHER SYMPTOMS: "Do you have any other symptoms?"   (e.g., chills, fatigue, headache, loss of smell or taste, muscle pain, sore throat)     Headache, coughing, Headache, body aches, constipated, vomited twice, tightness in chest, coughing a lot, shortness of breath, weak.  I drove home from [place] yesterday.   We got off the cruise ship yesterday and drove straight home.   I thought maybe I had the flu but this felt different than the flu.   When I tested for Covid twice both times positive.  10. HIGH RISK DISEASE: "Do you have any chronic medical problems?" (e.g., asthma, heart or lung disease, weak immune system, obesity, etc.)       Asthma 11. VACCINE: "Have you had the COVID-19 vaccine?" If Yes, ask: "Which one, how many shots, when did you get it?"       Not asked 12. PREGNANCY: "Is there any chance you are pregnant?" "When was your last menstrual period?"       Not asked 13. O2 SATURATION MONITOR:  "Do you use an oxygen saturation monitor (pulse oximeter) at home?" If Yes, ask "What is your reading (oxygen level) today?" "What is your usual oxygen saturation reading?" (e.g., 95%)       Not asked  Protocols used: Coronavirus (COVID-19) Diagnosed or Suspected-A-AH

## 2022-03-25 NOTE — Telephone Encounter (Signed)
Noted and totally agree with refer to ED given morbid obesity and lung disease pre covid

## 2022-03-25 NOTE — ED Notes (Signed)
Registration told this tech that she left

## 2022-03-29 ENCOUNTER — Ambulatory Visit (HOSPITAL_COMMUNITY): Admission: RE | Admit: 2022-03-29 | Payer: Self-pay | Source: Home / Self Care | Admitting: Obstetrics and Gynecology

## 2022-03-29 ENCOUNTER — Encounter (HOSPITAL_COMMUNITY): Admission: RE | Payer: Self-pay | Source: Home / Self Care

## 2022-03-29 SURGERY — HYSTERECTOMY, VAGINAL
Anesthesia: Choice | Laterality: Bilateral

## 2022-04-04 ENCOUNTER — Other Ambulatory Visit: Payer: Self-pay

## 2022-04-12 ENCOUNTER — Other Ambulatory Visit: Payer: Self-pay

## 2022-04-12 ENCOUNTER — Other Ambulatory Visit: Payer: Self-pay | Admitting: Critical Care Medicine

## 2022-04-19 ENCOUNTER — Other Ambulatory Visit: Payer: Self-pay

## 2022-05-12 ENCOUNTER — Other Ambulatory Visit: Payer: Self-pay | Admitting: Critical Care Medicine

## 2022-05-12 DIAGNOSIS — J4520 Mild intermittent asthma, uncomplicated: Secondary | ICD-10-CM

## 2022-05-12 DIAGNOSIS — I10 Essential (primary) hypertension: Secondary | ICD-10-CM

## 2022-05-13 ENCOUNTER — Other Ambulatory Visit: Payer: Self-pay

## 2022-05-13 MED ORDER — ALBUTEROL SULFATE HFA 108 (90 BASE) MCG/ACT IN AERS
1.0000 | INHALATION_SPRAY | Freq: Four times a day (QID) | RESPIRATORY_TRACT | 0 refills | Status: DC | PRN
  Filled 2022-05-13: qty 6.7, 25d supply, fill #0

## 2022-05-13 MED ORDER — FLUTICASONE-SALMETEROL 100-50 MCG/ACT IN AEPB
1.0000 | INHALATION_SPRAY | Freq: Two times a day (BID) | RESPIRATORY_TRACT | 0 refills | Status: DC
Start: 2022-05-13 — End: 2022-06-21
  Filled 2022-05-13: qty 60, 30d supply, fill #0

## 2022-05-13 MED ORDER — HYDROCHLOROTHIAZIDE 25 MG PO TABS
ORAL_TABLET | ORAL | 0 refills | Status: DC
  Filled 2022-05-13: qty 30, 30d supply, fill #0

## 2022-05-13 MED ORDER — ALBUTEROL SULFATE (2.5 MG/3ML) 0.083% IN NEBU
2.5000 mg | INHALATION_SOLUTION | Freq: Four times a day (QID) | RESPIRATORY_TRACT | 0 refills | Status: DC | PRN
  Filled 2022-05-13: qty 180, 15d supply, fill #0

## 2022-05-13 NOTE — Telephone Encounter (Signed)
Requested Prescriptions  Pending Prescriptions Disp Refills  . hydrochlorothiazide (HYDRODIURIL) 25 MG tablet 60 tablet 4    Sig: TAKE 1 TABLET(25 MG) BY MOUTH DAILY     Cardiovascular: Diuretics - Thiazide Failed - 05/12/2022  5:22 PM      Failed - Cr in normal range and within 180 days    Creatinine, Ser  Date Value Ref Range Status  08/03/2021 0.90 0.57 - 1.00 mg/dL Final         Failed - K in normal range and within 180 days    Potassium  Date Value Ref Range Status  08/03/2021 3.9 3.5 - 5.2 mmol/L Final         Failed - Na in normal range and within 180 days    Sodium  Date Value Ref Range Status  08/03/2021 140 134 - 144 mmol/L Final         Failed - Last BP in normal range    BP Readings from Last 1 Encounters:  03/25/22 (!) 157/108         Failed - Valid encounter within last 6 months    Recent Outpatient Visits          9 months ago Increased severity of headaches   [place], Patrick E, MD   1 year ago Essential hypertension   [place], MD   1 year ago Mild intermittent asthma without complication   [place], [place], [place]   2 years ago Essential hypertension   [place] Elsie Stain, MD             . albuterol (VENTOLIN HFA) 108 (90 Base) MCG/ACT inhaler 18 g 0    Sig: Inhale 1-2 puffs into the lungs every 6 (six) hours as needed for wheezing or shortness of breath.     Pulmonology:  Beta Agonists 2 Failed - 05/12/2022  5:22 PM      Failed - Last BP in normal range    BP Readings from Last 1 Encounters:  03/25/22 (!) 157/108         Passed - Last Heart Rate in normal range    Pulse Readings from Last 1 Encounters:  03/25/22 85         Passed - Valid encounter within last 12 months    Recent Outpatient Visits          9 months ago Increased severity of headaches   [place], Patrick E, MD   1 year ago Essential hypertension   [place], MD   1 year ago Mild intermittent asthma without complication   [place], [place], [place]   2 years ago Essential hypertension   [place] Elsie Stain, MD             . fluticasone-salmeterol (ADVAIR DISKUS) 100-50 MCG/ACT AEPB 60 each 0    Sig: Inhale 1 puff into the lungs 2 (two) times daily.     Pulmonology:  Combination Products Passed - 05/12/2022  5:22 PM      Passed - Valid encounter within last 12 months    Recent Outpatient Visits          9 months ago Increased severity of headaches   [place]  [facility] Elsie Stain, MD   1 year ago Essential hypertension   [place], MD   1 year ago Mild intermittent asthma without complication   [place], Dionne Bucy, [place]   2 years ago Essential hypertension   [place] Elsie Stain, MD             . albuterol (PROVENTIL) (2.5 MG/3ML) 0.083% nebulizer solution 180 mL 0    Sig: USE 1 VIAL VIA NEBULIZER EVERY 6 HOURS AS NEEDED FOR WHEEZING OR SHORTNESS OF BREATH     Pulmonology:  Beta Agonists 2 Failed - 05/12/2022  5:22 PM      Failed - Last BP in normal range    BP Readings from Last 1 Encounters:  03/25/22 (!) 157/108         Passed - Last Heart Rate in normal range    Pulse Readings from Last 1 Encounters:  03/25/22 85         Passed - Valid encounter within last 12 months    Recent Outpatient Visits          9 months ago Increased severity of headaches   [place] Elsie Stain, MD   1 year ago Essential hypertension   [place], MD   1 year ago Mild  intermittent asthma without complication   [place], Dionne Bu[place], [place]   2 years ago Essential hypertension   [place] Elsie Stain, MD

## 2022-05-13 NOTE — Telephone Encounter (Signed)
Requested medication (s) are due for refill today: yes  Requested medication (s) are on the active medication list: yes  Last refill:  05/04/21  Future visit scheduled: no  Notes to clinic:  Unable to refill per protocol, appointment needed.  Routing for approval.     Requested Prescriptions  Pending Prescriptions Disp Refills   hydrochlorothiazide (HYDRODIURIL) 25 MG tablet 60 tablet 4    Sig: TAKE 1 TABLET(25 MG) BY MOUTH DAILY     Cardiovascular: Diuretics - Thiazide Failed - 05/12/2022  5:22 PM      Failed - Cr in normal range and within 180 days    Creatinine, Ser  Date Value Ref Range Status  08/03/2021 0.90 0.57 - 1.00 mg/dL Final         Failed - K in normal range and within 180 days    Potassium  Date Value Ref Range Status  08/03/2021 3.9 3.5 - 5.2 mmol/L Final         Failed - Na in normal range and within 180 days    Sodium  Date Value Ref Range Status  08/03/2021 140 134 - 144 mmol/L Final         Failed - Last BP in normal range    BP Readings from Last 1 Encounters:  03/25/22 (!) 157/108         Failed - Valid encounter within last 6 months    Recent Outpatient Visits           9 months ago Increased severity of headaches   [place], Patrick E, MD   1 year ago Essential hypertension   [place], MD   1 year ago Mild intermittent asthma without complication   [place], [place], [place]   2 years ago Essential hypertension   [place] Elsie Stain, MD              Signed Prescriptions Disp Refills   albuterol (VENTOLIN HFA) 108 (90 Base) MCG/ACT inhaler 18 g 0    Sig: Inhale 1-2 puffs into the lungs every 6 (six) hours as needed for wheezing or shortness of breath.     Pulmonology:  Beta Agonists 2 Failed - 05/12/2022  5:22 PM      Failed - Last BP in normal range    BP  Readings from Last 1 Encounters:  03/25/22 (!) 157/108         Passed - Last Heart Rate in normal range    Pulse Readings from Last 1 Encounters:  03/25/22 85         Passed - Valid encounter within last 12 months    Recent Outpatient Visits           9 months ago Increased severity of headaches   [place], Patrick E, MD   1 year ago Essential hypertension   [place], MD   1 year ago Mild intermittent asthma without complication   [place], Dionne [place], [place]   2 years ago Essential hypertension   [place] Elsie Stain, MD               fluticasone-salmeterol (ADVAIR DISKUS) 100-50 MCG/ACT AEPB 60 each 0    Sig: Inhale 1 puff into  the lungs 2 (two) times daily.     Pulmonology:  Combination Products Passed - 05/12/2022  5:22 PM      Passed - Valid encounter within last 12 months    Recent Outpatient Visits           9 months ago Increased severity of headaches   [place], Patrick E, MD   1 year ago Essential hypertension   [place], MD   1 year ago Mild intermittent asthma without complication   [place], [name], [place]   2 years ago Essential hypertension   [place] Elsie Stain, MD               albuterol (PROVENTIL) (2.5 MG/3ML) 0.083% nebulizer solution 180 mL 0    Sig: USE 1 VIAL VIA NEBULIZER EVERY 6 HOURS AS NEEDED FOR WHEEZING OR SHORTNESS OF BREATH     Pulmonology:  Beta Agonists 2 Failed - 05/12/2022  5:22 PM      Failed - Last BP in normal range    BP Readings from Last 1 Encounters:  03/25/22 (!) 157/108         Passed - Last Heart Rate in normal range    Pulse Readings from Last 1 Encounters:  03/25/22 85          Passed - Valid encounter within last 12 months    Recent Outpatient Visits           9 months ago Increased severity of headaches   [place] Elsie Stain, MD   1 year ago Essential hypertension   [place], MD   1 year ago Mild intermittent asthma without complication   [place], Dionne Bu[place], [place]   2 years ago Essential hypertension   [place] Elsie Stain, MD

## 2022-05-20 ENCOUNTER — Telehealth: Payer: Self-pay

## 2022-05-20 ENCOUNTER — Other Ambulatory Visit (HOSPITAL_COMMUNITY): Payer: Self-pay

## 2022-05-20 ENCOUNTER — Other Ambulatory Visit: Payer: Self-pay

## 2022-05-20 ENCOUNTER — Telehealth: Payer: Self-pay | Admitting: *Deleted

## 2022-05-20 ENCOUNTER — Emergency Department (HOSPITAL_COMMUNITY)
Admission: EM | Admit: 2022-05-20 | Discharge: 2022-05-20 | Disposition: A | Discharge status: Home / Self Care | Payer: Commercial Managed Care - HMO | Attending: Emergency Medicine | Admitting: Emergency Medicine

## 2022-05-20 ENCOUNTER — Encounter (HOSPITAL_COMMUNITY): Payer: Self-pay | Admitting: *Deleted

## 2022-05-20 DIAGNOSIS — N938 Other specified abnormal uterine and vaginal bleeding: Secondary | ICD-10-CM

## 2022-05-20 DIAGNOSIS — Z79899 Other long term (current) drug therapy: Secondary | ICD-10-CM | POA: Diagnosis not present

## 2022-05-20 DIAGNOSIS — N939 Abnormal uterine and vaginal bleeding, unspecified: Secondary | ICD-10-CM | POA: Diagnosis not present

## 2022-05-20 LAB — CBC WITH DIFFERENTIAL/PLATELET
Abs Immature Granulocytes: 0 10*3/uL (ref 0.00–0.07)
Basophils Absolute: 0 10*3/uL (ref 0.0–0.1)
Basophils Relative: 1 %
Eosinophils Absolute: 0.2 10*3/uL (ref 0.0–0.5)
Eosinophils Relative: 3 %
HCT: 36.3 % (ref 36.0–46.0)
Hemoglobin: 11.5 g/dL — ABNORMAL LOW (ref 12.0–15.0)
Immature Granulocytes: 0 %
Lymphocytes Relative: 46 %
Lymphs Abs: 2.5 10*3/uL (ref 0.7–4.0)
MCH: 25.2 pg — ABNORMAL LOW (ref 26.0–34.0)
MCHC: 31.7 g/dL (ref 30.0–36.0)
MCV: 79.4 fL — ABNORMAL LOW (ref 80.0–100.0)
Monocytes Absolute: 0.3 10*3/uL (ref 0.1–1.0)
Monocytes Relative: 6 %
Neutro Abs: 2.3 10*3/uL (ref 1.7–7.7)
Neutrophils Relative %: 44 %
Platelets: 320 10*3/uL (ref 150–400)
RBC: 4.57 MIL/uL (ref 3.87–5.11)
RDW: 17.3 % — ABNORMAL HIGH (ref 11.5–15.5)
WBC: 5.3 10*3/uL (ref 4.0–10.5)
nRBC: 0 % (ref 0.0–0.2)

## 2022-05-20 LAB — BASIC METABOLIC PANEL
Anion gap: 7 (ref 5–15)
BUN: 6 mg/dL (ref 6–20)
CO2: 23 mmol/L (ref 22–32)
Calcium: 8.7 mg/dL — ABNORMAL LOW (ref 8.9–10.3)
Chloride: 110 mmol/L (ref 98–111)
Creatinine, Ser: 0.8 mg/dL (ref 0.44–1.00)
GFR, Estimated: >60 mL/min (ref >60)
Glucose, Bld: 128 mg/dL — ABNORMAL HIGH (ref 70–99)
Potassium: 3.8 mmol/L (ref 3.5–5.1)
Sodium: 140 mmol/L (ref 135–145)

## 2022-05-20 LAB — I-STAT BETA HCG BLOOD, ED (MC, WL, AP ONLY): I-stat hCG, quantitative: <5 m[IU]/mL (ref <5)

## 2022-05-20 MED ORDER — TRANEXAMIC ACID 650 MG PO TABS
1300.0000 mg | ORAL_TABLET | Freq: Once | ORAL | Status: AC
  Administered 2022-05-20: 1300 mg via ORAL
  Filled 2022-05-20: qty 2

## 2022-05-20 MED ORDER — TRANEXAMIC ACID 650 MG PO TABS
1300.0000 mg | ORAL_TABLET | Freq: Three times a day (TID) | ORAL | 0 refills | Status: AC
  Filled 2022-05-20 (×2): qty 30, 5d supply, fill #0

## 2022-05-20 MED ORDER — FERROUS SULFATE 325 (65 FE) MG PO TABS
325.0000 mg | ORAL_TABLET | Freq: Every day | ORAL | 0 refills | Status: DC
  Filled 2022-05-20: qty 30, 30d supply, fill #0

## 2022-05-20 NOTE — Telephone Encounter (Signed)
Patient called front office to report she is at [facility] ED and was told she will have a 22 hour wait. Pt states she needs to see Dr. Elgie Congo. Call transferred to RN.   Pt states she called the office and was told by after hours answering service to call 911. Pt states she told the nurse that she was hemorrhaging from her vagina, passing clots, and feeling dizzy. Pt states she checking into ED and was told that the person waiting the longest has been waiting for 22 hours and they are still in the waiting room. Patient states she is going to "bleed to death." Per chart review patient has had initial triage by RN and PA including vital signs (stable) and CBC (hemoglobin 11.5). I encouraged pt to wait to be seen. Emphasized that ED staff is doing their best to keep her safe. Pt states she plans to leave. Reviewed office hours with patient. Encouraged pt to follow up via phone on Monday AM if she still has concerns.

## 2022-05-20 NOTE — Discharge Instructions (Addendum)
Call the office on Monday and let them know how you are doing.  See when they would see you in the office.  The OB/GYN wants you to stop taking your Megace and take a 5-day course of the lysteda.  Once you have finished this then you can go back on your Megace.  Please return to the emergency department for worsening bleeding if you feel like you get a pass out.

## 2022-05-20 NOTE — ED Triage Notes (Signed)
C/o vaginal bleeding onset several weeks ago, states the bleeding is heavier and she is having to change pads every 30 mins. Scheduled for surgery next month.

## 2022-05-20 NOTE — ED Provider Triage Note (Signed)
Emergency Medicine Provider Triage Evaluation Note  Angela Burton , a 51 y.o. female  was evaluated in triage.  Pt complains of heavy vaginal bleeding x 1 week, soaking through pads, with clots. Scheduled for hysterectomy on 05/31/22. Vitals stable with EMS.  Review of Systems  Positive: Vaginal bleeding Negative:   Physical Exam  BP (!) 158/94 (BP Location: Right Arm)   Pulse 74   Temp 99.1 F (37.3 C) (Oral)   Resp 16   SpO2 100%  Gen:   Awake, no distress   Resp:  Normal effort  MSK:   Moves extremities without difficulty  Other:  Well appearing, no distress   Medical Decision Making  Medically screening exam initiated at 8:18 AM.  Appropriate orders placed.  Angela Burton was informed that the remainder of the evaluation will be completed by another provider, this initial triage assessment does not replace that evaluation, and the importance of remaining in the ED until their evaluation is complete.     Tacy Learn, PA-C 05/20/22 [phone]

## 2022-05-20 NOTE — ED Provider Notes (Signed)
[facility] EMERGENCY DEPARTMENT Provider Note   CSN: [id number] Arrival date & time: 05/20/22  4098     History  Chief Complaint  Patient presents with   Vaginal Bleeding    Angela Burton is a 51 y.o. female.  51 yo F with heavy vaginal bleeding.  Going on for a few days now.  Has a history of the same.  Is currently on Megace.  Scheduled for hysterectomy in the upcoming month.  She started passing more clots and changing her pad more often and so came to the emergency department for evaluation.   Vaginal Bleeding      Home Medications Prior to Admission medications   Medication Sig Start Date End Date Taking? Authorizing Provider  ferrous sulfate 325 (65 FE) MG tablet Take 1 tablet (325 mg total) by mouth daily. 05/20/22  Yes Deno Etienne, DO  tranexamic acid (LYSTEDA) 650 MG TABS tablet Take 2 tablets (1,300 mg total) by mouth 3 (three) times daily for 5 days. 05/20/22 05/25/22 Yes Tyrone Nine, Pier Laux, DO  albuterol (PROVENTIL) (2.5 MG/3ML) 0.083% nebulizer solution USE 1 VIAL VIA NEBULIZER EVERY 6 HOURS AS NEEDED FOR WHEEZING OR SHORTNESS OF BREATH 05/13/22   Elsie Stain, MD  albuterol (VENTOLIN HFA) 108 (90 Base) MCG/ACT inhaler Inhale 1-2 puffs into the lungs every 6 (six) hours as needed for wheezing or shortness of breath. 05/13/22   Elsie Stain, MD  cetirizine (ZYRTEC) 10 MG tablet Take 1 tablet (10 mg total) by mouth daily. 09/15/21   Elsie Stain, MD  fluticasone-salmeterol (ADVAIR DISKUS) 100-50 MCG/ACT AEPB Inhale 1 puff into the lungs 2 (two) times daily. 05/13/22   Elsie Stain, MD  hydrochlorothiazide (HYDRODIURIL) 25 MG tablet TAKE 1 TABLET(25 MG) BY MOUTH DAILY (Must have office visit for refills) 05/13/22   Elsie Stain, MD  megestrol (MEGACE) 40 MG tablet Take 1 tablet (40 mg total) by mouth 2 (two) times daily. Patient taking differently: Take 80 mg by mouth daily. 01/20/22   Chancy Milroy, MD  omeprazole (PRILOSEC OTC) 20 MG  tablet Take 20 mg by mouth daily.    [provider]      Allergies    Sulfa antibiotics, Aspirin, and Erythromycin    Review of Systems   Review of Systems  Genitourinary:  Positive for vaginal bleeding.    Physical Exam Updated Vital Signs BP (!) 164/94 (BP Location: Right Arm)   Pulse 76   Temp 98.9 F (37.2 C) (Oral)   Resp 18   Ht '5\' 6"'$  (1.676 m)   Wt 111.1 kg   SpO2 98%   BMI 39.54 kg/m  Physical Exam Vitals and nursing note reviewed.  Constitutional:      General: She is not in acute distress.    Appearance: She is well-developed. She is not diaphoretic.  HENT:     Head: Normocephalic and atraumatic.  Eyes:     Pupils: Pupils are equal, round, and reactive to light.  Cardiovascular:     Rate and Rhythm: Normal rate and regular rhythm.     Heart sounds: No murmur heard.    No friction rub. No gallop.  Pulmonary:     Effort: Pulmonary effort is normal.     Breath sounds: No wheezing or rales.  Abdominal:     General: There is no distension.     Palpations: Abdomen is soft.     Tenderness: There is no abdominal tenderness.  Musculoskeletal:  General: No tenderness.     Cervical back: Normal range of motion and neck supple.  Skin:    General: Skin is warm and dry.  Neurological:     Mental Status: She is alert and oriented to person, place, and time.  Psychiatric:        Behavior: Behavior normal.     ED Results / Procedures / Treatments   Labs (all labs ordered are listed, but only abnormal results are displayed) Labs Reviewed  CBC WITH DIFFERENTIAL/PLATELET - Abnormal; Notable for the following components:      Result Value   Hemoglobin 11.5 (*)    MCV 79.4 (*)    MCH 25.2 (*)    RDW 17.3 (*)    All other components within normal limits  BASIC METABOLIC PANEL - Abnormal; Notable for the following components:   Glucose, Bld 128 (*)    Calcium 8.7 (*)    All other components within normal limits  I-STAT BETA HCG BLOOD, ED ([facility],  [facility], [facility] ONLY)    EKG None  Radiology No results found.  Procedures Procedures    Medications Ordered in ED Medications  tranexamic acid (LYSTEDA) tablet 1,300 mg (has no administration in time range)    ED Course/ Medical Decision Making/ A&P                           Medical Decision Making Risk OTC drugs. Prescription drug management.   51 yo F with a chief complaints of heavy menstrual bleeding.  This has been an ongoing issue for her.  Currently is on Megace and states she has been taking it as prescribed.  Her pregnancy test is negative.  Hemoglobin 11.5, last checked 13.7 about a year ago.  I discussed case with Dr. Ilda Basset, GYN recommending stopping Megace and starting TXA.  5-day course prescribed.  We will have her call the office on Monday.  12:22 PM:  I have discussed the diagnosis/risks/treatment options with the patient and family.  Evaluation and diagnostic testing in the emergency department does not suggest an emergent condition requiring admission or immediate intervention beyond what has been performed at this time.  They will follow up with  GYN. We also discussed returning to the ED immediately if new or worsening sx occur. We discussed the sx which are most concerning (e.g., sudden worsening pain, fever, inability to tolerate by mouth, worsening bleeding, near syncope) that necessitate immediate return. Medications administered to the patient during their visit and any new prescriptions provided to the patient are listed below.  Medications given during this visit Medications  tranexamic acid (LYSTEDA) tablet 1,300 mg (has no administration in time range)     The patient appears reasonably screen and/or stabilized for discharge and I doubt any other medical condition or other [facility] requiring further screening, evaluation, or treatment in the ED at this time prior to discharge.          Final Clinical Impression(s) / ED Diagnoses Final diagnoses:   Dysfunctional uterine bleeding    Rx / DC Orders ED Discharge Orders          Ordered    tranexamic acid (LYSTEDA) 650 MG TABS tablet  3 times daily        05/20/22 1148    ferrous sulfate 325 (65 FE) MG tablet  Daily        05/20/22 1148  Deno Etienne, DO 05/20/22 1222

## 2022-05-20 NOTE — Telephone Encounter (Signed)
Pharmacy called related to Rx: tranexamic not being available at pharmacy of choice...EDCM contacted [facility] to verify that they had it in stock.  RNCM contacted [facility] to have them suspend hold and have filled at [place].

## 2022-05-20 NOTE — Progress Notes (Signed)
Surgical Instructions    Your procedure is scheduled on Tuesday, 05/31/22.  Report to [facility] Main Entrance "A" at 5:30 A.M., then check in with the Admitting office.  Call this number if you have problems the morning of surgery:  [phone]   If you have any questions prior to your surgery date call [phone]: Open Monday-Friday 8am-4pm If you experience any cold or flu symptoms such as cough, fever, chills, shortness of breath, etc. between now and your scheduled surgery, please notify us at the above number     Remember:  Do not eat after midnight the night before your surgery  You may drink clear liquids until 4:30am the morning of your surgery.   Clear liquids allowed are: Water, Non-Citrus Juices (without pulp), Carbonated Beverages, Clear Tea, Black Coffee ONLY (NO MILK, CREAM OR POWDERED CREAMER of any kind), and Gatorade    Take these medicines the morning of surgery with A SIP OF WATER:  cetirizine (ZYRTEC)  fluticasone-salmeterol (ADVAIR DISKUS) megestrol (MEGACE) omeprazole (PRILOSEC OTC)  tranexamic acid (LYSTEDA)  IF NEEDED: albuterol nebulizer and inhaler (bring inhaler with you the day of surgery)  As of today, STOP taking any Aspirin (unless otherwise instructed by your surgeon) Aleve, Naproxen, Ibuprofen, Motrin, Advil, Goody's, BC's, all herbal medications, fish oil, and all vitamins.           Do not wear jewelry or makeup. Do not wear lotions, powders, perfumes/cologne or deodorant. Do not shave 48 hours prior to surgery.   Do not bring valuables to the hospital. Do not wear nail polish, gel polish, artificial nails, or any other type of covering on natural nails (fingers and toes) If you have artificial nails or gel coating that need to be removed by a nail salon, please have this removed prior to surgery. Artificial nails or gel coating may interfere with anesthesia's ability to adequately monitor your vital signs.  [facility] is not responsible  for any belongings or valuables.    Do NOT Smoke (Tobacco/Vaping)  24 hours prior to your procedure  If you use a CPAP at night, you may bring your mask for your overnight stay.   Contacts, glasses, hearing aids, dentures or partials may not be worn into surgery, please bring cases for these belongings   For patients admitted to the hospital, discharge time will be determined by your treatment team.   Patients discharged the day of surgery will not be allowed to drive home, and someone needs to stay with them for 24 hours.   SURGICAL WAITING ROOM VISITATION Patients having surgery or a procedure may have no more than 2 support people in the waiting area - these visitors may rotate.   Children under the age of 26 must have an adult with them who is not the patient. If the patient needs to stay at the hospital during part of their recovery, the visitor guidelines for inpatient rooms apply. Pre-op nurse will coordinate an appropriate time for 1 support person to accompany patient in pre-op.  This support person may not rotate.   Please refer to the [facility] website for the visitor guidelines for Inpatients (after your surgery is over and you are in a regular room).    Special instructions:    Oral Hygiene is also important to reduce your risk of infection.  Remember - BRUSH YOUR TEETH THE MORNING OF SURGERY WITH YOUR REGULAR TOOTHPASTE   [facility]- Preparing For Surgery  Before surgery, you can play an important role. Because  skin is not sterile, your skin needs to be as free of germs as possible. You can reduce the number of germs on your skin by washing with CHG (chlorahexidine gluconate) Soap before surgery.  CHG is an antiseptic cleaner which kills germs and bonds with the skin to continue killing germs even after washing.     Please do not use if you have an allergy to CHG or antibacterial soaps. If your skin becomes reddened/irritated stop using the CHG.  Do not shave  (including legs and underarms) for at least 48 hours prior to first CHG shower. It is OK to shave your face.  Please follow these instructions carefully.     Shower the NIGHT BEFORE SURGERY and the MORNING OF SURGERY with CHG Soap.   If you chose to wash your hair, wash your hair first as usual with your normal shampoo. After you shampoo, rinse your hair and body thoroughly to remove the shampoo.  Then [employer] and genitals (private parts) with your normal soap and rinse thoroughly to remove soap.  After that Use CHG Soap as you would any other liquid soap. You can apply CHG directly to the skin and wash gently with a scrungie or a clean washcloth.   Apply the CHG Soap to your body ONLY FROM THE NECK DOWN.  Do not use on open wounds or open sores. Avoid contact with your eyes, ears, mouth and genitals (private parts). Wash Face and genitals (private parts)  with your normal soap.   Wash thoroughly, paying special attention to the area where your surgery will be performed.  Thoroughly rinse your body with warm water from the neck down.  DO NOT shower/wash with your normal soap after using and rinsing off the CHG Soap.  Pat yourself dry with a CLEAN TOWEL.  Wear CLEAN PAJAMAS to bed the night before surgery  Place CLEAN SHEETS on your bed the night before your surgery  DO NOT SLEEP WITH PETS.   Day of Surgery: Take a shower with CHG soap. Wear Clean/Comfortable clothing the morning of surgery Do not apply any deodorants/lotions.   Remember to brush your teeth WITH YOUR REGULAR TOOTHPASTE.    If you received a COVID test during your pre-op visit, it is requested that you wear a mask when out in public, stay away from anyone that may not be feeling well, and notify your surgeon if you develop symptoms. If you have been in contact with anyone that has tested positive in the last 10 days, please notify your surgeon.    Please read over the following fact sheets that you were  given.

## 2022-05-23 ENCOUNTER — Inpatient Hospital Stay (HOSPITAL_COMMUNITY)
Admission: RE | Admit: 2022-05-23 | Discharge: 2022-05-23 | Disposition: A | Discharge status: Home / Self Care | Payer: Self-pay | Source: Ambulatory Visit

## 2022-05-23 ENCOUNTER — Other Ambulatory Visit: Payer: Self-pay

## 2022-05-23 DIAGNOSIS — D259 Leiomyoma of uterus, unspecified: Secondary | ICD-10-CM

## 2022-05-23 DIAGNOSIS — N926 Irregular menstruation, unspecified: Secondary | ICD-10-CM

## 2022-05-23 NOTE — Addendum Note (Signed)
Addended by: [name] on: 05/23/2022 01:33 PM   Modules accepted: Orders

## 2022-05-26 ENCOUNTER — Encounter (HOSPITAL_COMMUNITY): Payer: Self-pay

## 2022-05-26 ENCOUNTER — Other Ambulatory Visit: Payer: Self-pay

## 2022-05-26 ENCOUNTER — Encounter (HOSPITAL_COMMUNITY)
Admission: RE | Admit: 2022-05-26 | Discharge: 2022-05-26 | Disposition: A | Discharge status: Home / Self Care | Payer: Commercial Managed Care - HMO | Source: Ambulatory Visit | Attending: Obstetrics and Gynecology | Admitting: Obstetrics and Gynecology

## 2022-05-26 VITALS — BP 160/88 | HR 85 | Temp 99.0°F | Resp 17 | Ht 66.0 in | Wt 252.0 lb

## 2022-05-26 DIAGNOSIS — I10 Essential (primary) hypertension: Secondary | ICD-10-CM | POA: Insufficient documentation

## 2022-05-26 DIAGNOSIS — Z01818 Encounter for other preprocedural examination: Secondary | ICD-10-CM | POA: Diagnosis present

## 2022-05-26 DIAGNOSIS — N926 Irregular menstruation, unspecified: Secondary | ICD-10-CM | POA: Insufficient documentation

## 2022-05-26 HISTORY — DX: Type 2 diabetes mellitus without complications: E11.9

## 2022-05-26 HISTORY — DX: Other complications of anesthesia, initial encounter: T88.59XA

## 2022-05-26 HISTORY — DX: Thyrotoxicosis, unspecified without thyrotoxic crisis or storm: E05.90

## 2022-05-26 LAB — TYPE AND SCREEN
ABO/RH(D): A NEG
Antibody Screen: NEGATIVE

## 2022-05-26 NOTE — Progress Notes (Signed)
PCP - Dr. [name] Cardiologist - Denies  PPM/ICD - Denies  Chest x-ray - N/A EKG - 05/26/22 Stress Test - Denies ECHO - Denies Cardiac Cath - Denies  Sleep Study - Yes, diagnosed with OSA years ago CPAP - No  Patient states she was diabetic before losing weight 10 years ago. Hasn't been on meds since.  Blood Thinner Instructions: N/A Aspirin Instructions: N/A  ERAS Protcol - No  COVID TEST- N/A   Anesthesia review: Yes, abnormal EKG  Patient denies shortness of breath, fever, cough and chest pain at PAT appointment   All instructions explained to the patient, with a verbal understanding of the material. Patient agrees to go over the instructions while at home for a better understanding. Patient also instructed to self quarantine after being tested for COVID-19. The opportunity to ask questions was provided.

## 2022-05-26 NOTE — Pre-Procedure Instructions (Addendum)
Surgical Instructions    Your procedure is scheduled on Tuesday, May 31, 2022 at 7:30 AM.  Report to [facility] Main Entrance "A" at 5:30 A.M., then check in with the Admitting office.  Call this number if you have problems the morning of surgery:  [phone]   If you have any questions prior to your surgery date call [phone]: Open Monday-Friday 8am-4pm If you experience any cold or flu symptoms such as cough, fever, chills, shortness of breath, etc. between now and your scheduled surgery, please notify us at the above number     Remember:  Do not eat or drink after midnight the night before your surgery    Take these medicines the morning of surgery with A SIP OF WATER:  cetirizine (ZYRTEC)  fluticasone-salmeterol (ADVAIR DISKUS) megestrol (MEGACE) omeprazole (PRILOSEC OTC)  tranexamic acid (LYSTEDA)  IF NEEDED: albuterol nebulizer and inhaler (bring inhaler with you the day of surgery)  As of today, STOP taking any Aspirin (unless otherwise instructed by your surgeon) Aleve, Naproxen, Ibuprofen, Motrin, Advil, Goody's, BC's, all herbal medications, fish oil, and all vitamins.  Do NOT Smoke (Tobacco/Vaping)  24 hours prior to your procedure  If you use a CPAP at night, you may bring your mask for your overnight stay.   Contacts, glasses, hearing aids, dentures or partials may not be worn into surgery, please bring cases for these belongings   For patients admitted to the hospital, discharge time will be determined by your treatment team.   Patients discharged the day of surgery will not be allowed to drive home, and someone needs to stay with them for 24 hours.   SURGICAL WAITING ROOM VISITATION Patients having surgery or a procedure may have no more than 2 support people in the waiting area - these visitors may rotate.   Children under the age of 61 must have an adult with them who is not the patient. If the patient needs to stay at the hospital during part of  their recovery, the visitor guidelines for inpatient rooms apply. Pre-op nurse will coordinate an appropriate time for 1 support person to accompany patient in pre-op.  This support person may not rotate.   Please refer to the [facility] website for the visitor guidelines for Inpatients (after your surgery is over and you are in a regular room).    Special instructions:    Oral Hygiene is also important to reduce your risk of infection.  Remember - BRUSH YOUR TEETH THE MORNING OF SURGERY WITH YOUR REGULAR TOOTHPASTE   [facility]- Preparing For Surgery  Before surgery, you can play an important role. Because skin is not sterile, your skin needs to be as free of germs as possible. You can reduce the number of germs on your skin by washing with CHG (chlorahexidine gluconate) Soap before surgery.  CHG is an antiseptic cleaner which kills germs and bonds with the skin to continue killing germs even after washing.     Please do not use if you have an allergy to CHG or antibacterial soaps. If your skin becomes reddened/irritated stop using the CHG.  Do not shave (including legs and underarms) for at least 48 hours prior to first CHG shower. It is OK to shave your face.  Please follow these instructions carefully.     Shower the NIGHT BEFORE SURGERY and the MORNING OF SURGERY with CHG Soap.   If you chose to wash your hair, wash your hair first as usual with your normal shampoo. After  you shampoo, rinse your hair and body thoroughly to remove the shampoo.  Then [employer] and genitals (private parts) with your normal soap and rinse thoroughly to remove soap.  After that Use CHG Soap as you would any other liquid soap. You can apply CHG directly to the skin and wash gently with a scrungie or a clean washcloth.   Apply the CHG Soap to your body ONLY FROM THE NECK DOWN.  Do not use on open wounds or open sores. Avoid contact with your eyes, ears, mouth and genitals (private parts). Wash Face and  genitals (private parts)  with your normal soap.   Wash thoroughly, paying special attention to the area where your surgery will be performed.  Thoroughly rinse your body with warm water from the neck down.  DO NOT shower/wash with your normal soap after using and rinsing off the CHG Soap.  Pat yourself dry with a CLEAN TOWEL.  Wear CLEAN PAJAMAS to bed the night before surgery  Place CLEAN SHEETS on your bed the night before your surgery  DO NOT SLEEP WITH PETS.   Day of Surgery: Take a shower with CHG soap. Wear Clean/Comfortable clothing the morning of surgery Do not wear jewelry or makeup. Do not wear lotions, powders, perfumes/cologne or deodorant. Do not shave 48 hours prior to surgery.   Do not bring valuables to the hospital. Do not wear nail polish, gel polish, artificial nails, or any other type of covering on natural nails (fingers and toes) If you have artificial nails or gel coating that need to be removed by a nail salon, please have this removed prior to surgery. Artificial nails or gel coating may interfere with anesthesia's ability to adequately monitor your vital signs. [facility] is not responsible for any belongings or valuables.   Remember to brush your teeth WITH YOUR REGULAR TOOTHPASTE.    If you received a COVID test during your pre-op visit, it is requested that you wear a mask when out in public, stay away from anyone that may not be feeling well, and notify your surgeon if you develop symptoms. If you have been in contact with anyone that has tested positive in the last 10 days, please notify your surgeon.    Please read over the following fact sheets that you were given.

## 2022-05-30 NOTE — Anesthesia Preprocedure Evaluation (Signed)
Anesthesia Evaluation  Patient identified by MRN, date of birth, ID band Patient awake    Reviewed: Allergy & Precautions, NPO status , Patient's Chart, lab work & pertinent test results  Airway Mallampati: II       Dental   Pulmonary Patient abstained from smoking., former smoker   breath sounds clear to auscultation       Cardiovascular hypertension,  Rhythm:Regular Rate:Normal     Neuro/Psych    GI/Hepatic negative GI ROS, Neg liver ROS,,,  Endo/Other  diabetes Hyperthyroidism   Renal/GU      Musculoskeletal   Abdominal   Peds  Hematology   Anesthesia Other Findings   Reproductive/Obstetrics                             Anesthesia Physical Anesthesia Plan  ASA: 3  Anesthesia Plan: General   Post-op Pain Management:    Induction:   PONV Risk Score and Plan: Ondansetron, Dexamethasone and Midazolam  Airway Management Planned: Oral ETT  Additional Equipment:   Intra-op Plan:   Post-operative Plan: Extubation in OR  Informed Consent: I have reviewed the patients History and Physical, chart, labs and discussed the procedure including the risks, benefits and alternatives for the proposed anesthesia with the patient or authorized representative who has indicated his/her understanding and acceptance.     Dental advisory given  Plan Discussed with: Anesthesiologist and CRNA  Anesthesia Plan Comments:         Anesthesia Quick Evaluation

## 2022-05-31 ENCOUNTER — Observation Stay (HOSPITAL_COMMUNITY): Payer: Commercial Managed Care - HMO | Admitting: Physician Assistant

## 2022-05-31 ENCOUNTER — Other Ambulatory Visit: Payer: Self-pay

## 2022-05-31 ENCOUNTER — Encounter (HOSPITAL_COMMUNITY)
Admission: RE | Disposition: A | Discharge status: Home / Self Care | Payer: Self-pay | Source: Home / Self Care | Attending: Obstetrics and Gynecology

## 2022-05-31 ENCOUNTER — Observation Stay (HOSPITAL_COMMUNITY)
Admission: RE | Admit: 2022-05-31 | Discharge: 2022-06-02 | Disposition: A | Discharge status: Home / Self Care | Payer: Commercial Managed Care - HMO | Attending: Obstetrics and Gynecology | Admitting: Obstetrics and Gynecology

## 2022-05-31 ENCOUNTER — Encounter (HOSPITAL_COMMUNITY): Payer: Self-pay | Admitting: Obstetrics and Gynecology

## 2022-05-31 ENCOUNTER — Observation Stay (HOSPITAL_BASED_OUTPATIENT_CLINIC_OR_DEPARTMENT_OTHER): Payer: Commercial Managed Care - HMO | Admitting: Anesthesiology

## 2022-05-31 DIAGNOSIS — D251 Intramural leiomyoma of uterus: Secondary | ICD-10-CM | POA: Diagnosis not present

## 2022-05-31 DIAGNOSIS — I10 Essential (primary) hypertension: Secondary | ICD-10-CM

## 2022-05-31 DIAGNOSIS — E119 Type 2 diabetes mellitus without complications: Secondary | ICD-10-CM

## 2022-05-31 DIAGNOSIS — N926 Irregular menstruation, unspecified: Secondary | ICD-10-CM

## 2022-05-31 DIAGNOSIS — E0590 Thyrotoxicosis, unspecified without thyrotoxic crisis or storm: Secondary | ICD-10-CM | POA: Diagnosis not present

## 2022-05-31 DIAGNOSIS — Z01818 Encounter for other preprocedural examination: Secondary | ICD-10-CM

## 2022-05-31 DIAGNOSIS — N920 Excessive and frequent menstruation with regular cycle: Secondary | ICD-10-CM | POA: Diagnosis not present

## 2022-05-31 DIAGNOSIS — Z79899 Other long term (current) drug therapy: Secondary | ICD-10-CM | POA: Insufficient documentation

## 2022-05-31 DIAGNOSIS — Z87891 Personal history of nicotine dependence: Secondary | ICD-10-CM | POA: Insufficient documentation

## 2022-05-31 DIAGNOSIS — D259 Leiomyoma of uterus, unspecified: Secondary | ICD-10-CM | POA: Diagnosis present

## 2022-05-31 DIAGNOSIS — J45909 Unspecified asthma, uncomplicated: Secondary | ICD-10-CM | POA: Diagnosis not present

## 2022-05-31 DIAGNOSIS — D250 Submucous leiomyoma of uterus: Secondary | ICD-10-CM

## 2022-05-31 DIAGNOSIS — D62 Acute posthemorrhagic anemia: Secondary | ICD-10-CM | POA: Diagnosis not present

## 2022-05-31 DIAGNOSIS — Z90710 Acquired absence of both cervix and uterus: Secondary | ICD-10-CM | POA: Diagnosis present

## 2022-05-31 HISTORY — PX: VAGINAL HYSTERECTOMY: SHX2639

## 2022-05-31 LAB — GLUCOSE, CAPILLARY: Glucose-Capillary: 164 mg/dL — ABNORMAL HIGH (ref 70–99)

## 2022-05-31 LAB — POCT PREGNANCY, URINE: Preg Test, Ur: NEGATIVE

## 2022-05-31 SURGERY — HYSTERECTOMY, VAGINAL
Anesthesia: General | Laterality: Bilateral

## 2022-05-31 MED ORDER — MIDAZOLAM HCL 2 MG/2ML IJ SOLN
INTRAMUSCULAR | Status: AC
  Filled 2022-05-31: qty 2

## 2022-05-31 MED ORDER — ONDANSETRON HCL 4 MG/2ML IJ SOLN
INTRAMUSCULAR | Status: DC | PRN
  Administered 2022-05-31: 4 mg via INTRAVENOUS

## 2022-05-31 MED ORDER — GABAPENTIN 300 MG PO CAPS
300.0000 mg | ORAL_CAPSULE | ORAL | Status: AC
  Administered 2022-05-31: 300 mg via ORAL
  Filled 2022-05-31: qty 1

## 2022-05-31 MED ORDER — KETOROLAC TROMETHAMINE 15 MG/ML IJ SOLN
INTRAMUSCULAR | Status: DC | PRN
  Administered 2022-05-31: 15 mg via INTRAVENOUS

## 2022-05-31 MED ORDER — LIDOCAINE 2% (20 MG/ML) 5 ML SYRINGE
INTRAMUSCULAR | Status: DC | PRN
  Administered 2022-05-31: 100 mg via INTRAVENOUS

## 2022-05-31 MED ORDER — CHLORHEXIDINE GLUCONATE 0.12 % MT SOLN
15.0000 mL | Freq: Once | OROMUCOSAL | Status: AC
  Administered 2022-05-31: 15 mL via OROMUCOSAL
  Filled 2022-05-31: qty 15

## 2022-05-31 MED ORDER — LACTATED RINGERS IV SOLN: INTRAVENOUS | Status: DC

## 2022-05-31 MED ORDER — ORAL CARE MOUTH RINSE: 15.0000 mL | Freq: Once | OROMUCOSAL | Status: AC

## 2022-05-31 MED ORDER — ACETAMINOPHEN 325 MG PO TABS
650.0000 mg | ORAL_TABLET | ORAL | Status: DC | PRN
  Administered 2022-05-31 – 2022-06-01 (×2): 650 mg via ORAL
  Filled 2022-05-31 (×2): qty 2

## 2022-05-31 MED ORDER — HYDROMORPHONE HCL 1 MG/ML IJ SOLN
INTRAMUSCULAR | Status: AC
  Filled 2022-05-31: qty 1

## 2022-05-31 MED ORDER — IBUPROFEN 600 MG PO TABS
600.0000 mg | ORAL_TABLET | Freq: Four times a day (QID) | ORAL | Status: DC
  Administered 2022-05-31 – 2022-06-02 (×6): 600 mg via ORAL
  Filled 2022-05-31 (×6): qty 1

## 2022-05-31 MED ORDER — PROPOFOL 10 MG/ML IV BOLUS
INTRAVENOUS | Status: DC | PRN
  Administered 2022-05-31: 200 mg via INTRAVENOUS

## 2022-05-31 MED ORDER — DEXAMETHASONE SODIUM PHOSPHATE 10 MG/ML IJ SOLN
INTRAMUSCULAR | Status: DC | PRN
  Administered 2022-05-31: 10 mg via INTRAVENOUS

## 2022-05-31 MED ORDER — DEXAMETHASONE SODIUM PHOSPHATE 10 MG/ML IJ SOLN
INTRAMUSCULAR | Status: AC
  Filled 2022-05-31: qty 1

## 2022-05-31 MED ORDER — SOD CITRATE-CITRIC ACID 500-334 MG/5ML PO SOLN
30.0000 mL | ORAL | Status: AC
  Administered 2022-05-31: 30 mL via ORAL
  Filled 2022-05-31: qty 30

## 2022-05-31 MED ORDER — ENOXAPARIN SODIUM 40 MG/0.4ML IJ SOSY
40.0000 mg | PREFILLED_SYRINGE | INTRAMUSCULAR | Status: DC
  Administered 2022-06-01 – 2022-06-02 (×2): 40 mg via SUBCUTANEOUS
  Filled 2022-05-31 (×2): qty 0.4

## 2022-05-31 MED ORDER — SUGAMMADEX SODIUM 200 MG/2ML IV SOLN
INTRAVENOUS | Status: DC | PRN
  Administered 2022-05-31: 224 mg via INTRAVENOUS

## 2022-05-31 MED ORDER — SIMETHICONE 80 MG PO CHEW: 80.0000 mg | CHEWABLE_TABLET | Freq: Four times a day (QID) | ORAL | Status: DC | PRN

## 2022-05-31 MED ORDER — 0.9 % SODIUM CHLORIDE (POUR BTL) OPTIME
TOPICAL | Status: DC | PRN
  Administered 2022-05-31: 100 mL

## 2022-05-31 MED ORDER — DOCUSATE SODIUM 100 MG PO CAPS
100.0000 mg | ORAL_CAPSULE | Freq: Two times a day (BID) | ORAL | Status: DC
  Administered 2022-05-31 – 2022-06-02 (×5): 100 mg via ORAL
  Filled 2022-05-31 (×5): qty 1

## 2022-05-31 MED ORDER — ONDANSETRON HCL 4 MG/2ML IJ SOLN
4.0000 mg | Freq: Four times a day (QID) | INTRAMUSCULAR | Status: DC | PRN
  Administered 2022-05-31 – 2022-06-01 (×2): 4 mg via INTRAVENOUS
  Filled 2022-05-31 (×2): qty 2

## 2022-05-31 MED ORDER — SENNOSIDES-DOCUSATE SODIUM 8.6-50 MG PO TABS: 1.0000 | ORAL_TABLET | Freq: Every evening | ORAL | Status: DC | PRN

## 2022-05-31 MED ORDER — ARTIFICIAL TEARS OPHTHALMIC OINT
TOPICAL_OINTMENT | OPHTHALMIC | Status: DC | PRN
  Administered 2022-05-31: 1 via OPHTHALMIC

## 2022-05-31 MED ORDER — ROCURONIUM BROMIDE 10 MG/ML (PF) SYRINGE
PREFILLED_SYRINGE | INTRAVENOUS | Status: AC
  Filled 2022-05-31: qty 10

## 2022-05-31 MED ORDER — FENTANYL CITRATE (PF) 250 MCG/5ML IJ SOLN
INTRAMUSCULAR | Status: AC
  Filled 2022-05-31: qty 5

## 2022-05-31 MED ORDER — HYDROMORPHONE HCL 1 MG/ML IJ SOLN
2.0000 mg | Freq: Once | INTRAMUSCULAR | Status: AC
  Administered 2022-05-31: 2 mg via INTRAVENOUS
  Filled 2022-05-31: qty 2

## 2022-05-31 MED ORDER — ARTIFICIAL TEARS OPHTHALMIC OINT
TOPICAL_OINTMENT | OPHTHALMIC | Status: AC
  Filled 2022-05-31: qty 3.5

## 2022-05-31 MED ORDER — KETOROLAC TROMETHAMINE 15 MG/ML IJ SOLN
15.0000 mg | INTRAMUSCULAR | Status: AC
  Administered 2022-05-31: 15 mg via INTRAVENOUS
  Filled 2022-05-31: qty 1

## 2022-05-31 MED ORDER — ACETAMINOPHEN 500 MG PO TABS
1000.0000 mg | ORAL_TABLET | ORAL | Status: AC
  Administered 2022-05-31: 1000 mg via ORAL
  Filled 2022-05-31: qty 2

## 2022-05-31 MED ORDER — CEFAZOLIN SODIUM-DEXTROSE 2-4 GM/100ML-% IV SOLN
2.0000 g | INTRAVENOUS | Status: AC
  Administered 2022-05-31: 2 g via INTRAVENOUS
  Filled 2022-05-31: qty 100

## 2022-05-31 MED ORDER — OXYCODONE HCL 5 MG PO TABS
5.0000 mg | ORAL_TABLET | ORAL | Status: DC | PRN
  Administered 2022-05-31 (×2): 5 mg via ORAL
  Administered 2022-06-01 – 2022-06-02 (×3): 10 mg via ORAL
  Filled 2022-05-31 (×4): qty 2
  Filled 2022-05-31: qty 1

## 2022-05-31 MED ORDER — LIDOCAINE 2% (20 MG/ML) 5 ML SYRINGE
INTRAMUSCULAR | Status: AC
  Filled 2022-05-31: qty 5

## 2022-05-31 MED ORDER — KETOROLAC TROMETHAMINE 30 MG/ML IJ SOLN
INTRAMUSCULAR | Status: AC
  Filled 2022-05-31: qty 1

## 2022-05-31 MED ORDER — MIDAZOLAM HCL 5 MG/5ML IJ SOLN
INTRAMUSCULAR | Status: DC | PRN
  Administered 2022-05-31: 2 mg via INTRAVENOUS

## 2022-05-31 MED ORDER — ROCURONIUM BROMIDE 10 MG/ML (PF) SYRINGE
PREFILLED_SYRINGE | INTRAVENOUS | Status: DC | PRN
  Administered 2022-05-31: 60 mg via INTRAVENOUS
  Administered 2022-05-31: 30 mg via INTRAVENOUS
  Administered 2022-05-31: 20 mg via INTRAVENOUS

## 2022-05-31 MED ORDER — PROPOFOL 10 MG/ML IV BOLUS
INTRAVENOUS | Status: AC
  Filled 2022-05-31: qty 20

## 2022-05-31 MED ORDER — FENTANYL CITRATE (PF) 250 MCG/5ML IJ SOLN
INTRAMUSCULAR | Status: DC | PRN
  Administered 2022-05-31: 150 ug via INTRAVENOUS
  Administered 2022-05-31 (×2): 25 ug via INTRAVENOUS
  Administered 2022-05-31: 50 ug via INTRAVENOUS

## 2022-05-31 MED ORDER — OXYCODONE HCL 5 MG PO TABS
ORAL_TABLET | ORAL | Status: AC
  Filled 2022-05-31: qty 1

## 2022-05-31 MED ORDER — ONDANSETRON HCL 4 MG/2ML IJ SOLN
INTRAMUSCULAR | Status: AC
  Filled 2022-05-31: qty 2

## 2022-05-31 MED ORDER — POVIDONE-IODINE 10 % EX SWAB
2.0000 | Freq: Once | CUTANEOUS | Status: AC
  Administered 2022-05-31: 2 via TOPICAL

## 2022-05-31 MED ORDER — ONDANSETRON HCL 4 MG PO TABS: 4.0000 mg | ORAL_TABLET | Freq: Four times a day (QID) | ORAL | Status: DC | PRN

## 2022-05-31 MED ORDER — HYDROMORPHONE HCL 1 MG/ML IJ SOLN
0.2500 mg | INTRAMUSCULAR | Status: DC | PRN
  Administered 2022-05-31 (×4): 0.25 mg via INTRAVENOUS

## 2022-05-31 SURGICAL SUPPLY — 26 items
BAG COUNTER SPONGE SURGICOUNT (BAG) ×1 IMPLANT
BLADE SURG 10 STRL SS (BLADE) ×1 IMPLANT
GAUZE 4X4 16PLY ~~LOC~~+RFID DBL (SPONGE) ×2 IMPLANT
GLOVE BIO SURGEON STRL SZ7.5 (GLOVE) ×1 IMPLANT
GLOVE BIOGEL PI IND STRL 8 (GLOVE) ×1 IMPLANT
GLOVE SURG ORTHO 8.0 STRL STRW (GLOVE) ×1 IMPLANT
GLOVE SURG SS PI 7.0 STRL IVOR (GLOVE) IMPLANT
GLOVE SURG UNDER POLY LF SZ7 (GLOVE) ×1 IMPLANT
GOWN STRL REUS W/ TWL LRG LVL3 (GOWN DISPOSABLE) ×1 IMPLANT
GOWN STRL REUS W/ TWL XL LVL3 (GOWN DISPOSABLE) ×2 IMPLANT
GOWN STRL REUS W/TWL LRG LVL3 (GOWN DISPOSABLE) ×2
GOWN STRL REUS W/TWL XL LVL3 (GOWN DISPOSABLE) ×2
HIBICLENS CHG 4% 4OZ BTL (MISCELLANEOUS) ×1 IMPLANT
KIT TURNOVER KIT B (KITS) ×1 IMPLANT
PACK VAGINAL WOMENS (CUSTOM PROCEDURE TRAY) ×1 IMPLANT
PAD OB MATERNITY 4.3X12.25 (PERSONAL CARE ITEMS) ×1 IMPLANT
SPONGE T-LAP 4X18 ~~LOC~~+RFID (SPONGE) IMPLANT
SUT VIC AB 2-0 CT1 18 (SUTURE) ×1 IMPLANT
SUT VIC AB 2-0 CT1 27 (SUTURE) ×1
SUT VIC AB 2-0 CT1 TAPERPNT 27 (SUTURE) ×1 IMPLANT
SUT VIC AB PLUS 45CM 1-MO-4 (SUTURE) ×2 IMPLANT
SUT VICRYL 2 0 18  TIES (SUTURE) ×1
SUT VICRYL 2 0 18 TIES (SUTURE) IMPLANT
TOWEL GREEN STERILE FF (TOWEL DISPOSABLE) ×1 IMPLANT
TRAY FOLEY W/BAG SLVR 14FR (SET/KITS/TRAYS/PACK) ×1 IMPLANT
UNDERPAD 30X36 HEAVY ABSORB (UNDERPADS AND DIAPERS) ×1 IMPLANT

## 2022-05-31 NOTE — Anesthesia Procedure Notes (Signed)
Procedure Name: Intubation Date/Time: 05/31/2022 7:48 AM  Performed by: Maude Leriche, CRNAPre-anesthesia Checklist: Patient identified, Emergency Drugs available, Suction available and Patient being monitored Patient Re-evaluated:Patient Re-evaluated prior to induction Oxygen Delivery Method: Circle system utilized Preoxygenation: Pre-oxygenation with 100% oxygen Induction Type: IV induction Ventilation: Mask ventilation without difficulty Laryngoscope Size: Miller and 2 Grade View: Grade I Tube type: Oral Tube size: 7.0 mm Number of attempts: 1 Airway Equipment and Method: Stylet Placement Confirmation: ETT inserted through vocal cords under direct vision, positive ETCO2 and breath sounds checked- equal and bilateral Secured at: 23 cm Tube secured with: Tape Dental Injury: Teeth and Oropharynx as per pre-operative assessment

## 2022-05-31 NOTE — Anesthesia Postprocedure Evaluation (Signed)
Anesthesia Post Note  Patient: Angela Burton  Procedure(s) Performed: HYSTERECTOMY VAGINAL WITH SALPINGECTOMY (Bilateral)     Patient location during evaluation: PACU Anesthesia Type: General Level of consciousness: awake Pain management: pain level controlled Vital Signs Assessment: post-procedure vital signs reviewed and stable Respiratory status: spontaneous breathing Cardiovascular status: stable Postop Assessment: no apparent nausea or vomiting Anesthetic complications: no   No notable events documented.  Last Vitals:  Vitals:   05/31/22 1045 05/31/22 1100  BP: (!) 148/78 (!) 157/84  Pulse: 69 71  Resp: 15 16  Temp:    SpO2: 100% 100%    Last Pain:  Vitals:   05/31/22 1100  TempSrc:   PainSc: Asleep                 Mikyle Sox

## 2022-05-31 NOTE — H&P (Signed)
OB/GYN Pre-Op History and Physical  Angela Burton is a 51 y.o. [id number] presenting for TVH.  She has been given risks and benefits of the procedure including bleeding, infection, involvement of other organs including bladder and bowel.  She has no hx of abnl pap..       Past Medical History:  Diagnosis Date   Asthma    albuterol   Complication of anesthesia    Patient states she had to be revived after her tubes were tied years ago   Diabetes mellitus without complication ([place])    Diagnosed years ago; no meds in 10 years after losing weight   Hypertension    Hyperthyroidism    Obesity     Past Surgical History:  Procedure Laterality Date   DILATION AND CURETTAGE OF UTERUS     HYSTEROSCOPY WITH D & C N/A 05/05/2020   Procedure: DILATATION AND CURETTAGE /HYSTEROSCOPY AND IUD REMOVAL;  Surgeon: [name], MD;  Location: [facility];  Service: Gynecology;  Laterality: N/A;   LAPAROSCOPIC OVARIAN CYSTECTOMY Right 05/05/2020   Procedure: OPERATIVE LAPAROSCOPY AND LAPAROSCOPIC OVARIAN CYSTECTOMY;  Surgeon: [name], MD;  Location: [facility];  Service: Gynecology;  Laterality: Right;   TUBAL LIGATION      OB History  Gravida Para Term Preterm AB Living  '3 2 2 '$ 0 1 2  SAB IAB Ectopic Multiple Live Births  0 1 0 0 2    # Outcome Date GA Lbr Len/2nd Weight Sex Delivery Anes PTL Lv  3 IAB           2 Term      Vag-Spont     1 Term      Vag-Spont       Social History   Socioeconomic History   Marital status: Legally Separated    Spouse name: Not on file   Number of children: Not on file   Years of education: Not on file   Highest education level: Not on file  Occupational History   Not on file  Tobacco Use   Smoking status: Former    Packs/day: 0.15    Types: Cigarettes    Quit date: 07/07/2021    Years since quitting: 0.8   Smokeless tobacco: Never   Tobacco comments:    3 cigarettes/2 wks  Vaping Use   Vaping Use: Never  used  Substance and Sexual Activity   Alcohol use: Yes    Alcohol/week: 2.0 standard drinks of alcohol    Types: 2 Glasses of wine per week    Comment: rarely   Drug use: Yes    Types: Marijuana    Comment: sometimes    Sexual activity: Yes    Birth control/protection: I.U.D., Surgical  Other Topics Concern   Not on file  Social History Narrative   Not on file   Social Determinants of Health   Financial Resource Strain: Not on file  Food Insecurity: Food Insecurity Present (10/22/2020)   Hunger Vital Sign    Worried About [place] in the Last Year: Sometimes true    Ran Out of Food in the Last Year: Sometimes true  Transportation Needs: No Transportation Needs (08/04/2020)   PRAPARE - Hydrologist (Medical): No    Lack of Transportation (Non-Medical): No  Physical Activity: Not on file  Stress: Not on file  Social Connections: Not on file    Family History  Problem Relation Age of Onset  Heart attack Mother    Diabetes Father    Hypertension Father    High Cholesterol Father    Breast cancer Sister    HIV Sister     Medications Prior to Admission  Medication Sig Dispense Refill Last Dose   albuterol (VENTOLIN HFA) 108 (90 Base) MCG/ACT inhaler Inhale 1-2 puffs into the lungs every 6 (six) hours as needed for wheezing or shortness of breath. 6.7 g 0 Past Week   cetirizine (ZYRTEC) 10 MG tablet Take 1 tablet (10 mg total) by mouth daily. 30 tablet 2 05/30/2022   hydrochlorothiazide (HYDRODIURIL) 25 MG tablet TAKE 1 TABLET(25 MG) BY MOUTH DAILY (Must have office visit for refills) 30 tablet 0 05/30/2022   megestrol (MEGACE) 40 MG tablet Take 1 tablet (40 mg total) by mouth 2 (two) times daily. (Patient taking differently: Take 80 mg by mouth daily.) 60 tablet 3 05/30/2022   omeprazole (PRILOSEC OTC) 20 MG tablet Take 20 mg by mouth daily.   05/30/2022   albuterol (PROVENTIL) (2.5 MG/3ML) 0.083% nebulizer solution USE 1 VIAL VIA NEBULIZER  EVERY 6 HOURS AS NEEDED FOR WHEEZING OR SHORTNESS OF BREATH 180 mL 0 More than a month   ferrous sulfate 325 (65 FE) MG tablet Take 1 tablet (325 mg total) by mouth daily. 30 tablet 0    fluticasone-salmeterol (ADVAIR DISKUS) 100-50 MCG/ACT AEPB Inhale 1 puff into the lungs 2 (two) times daily. 60 each 0 More than a month    Allergies  Allergen Reactions   Sulfa Antibiotics Anaphylaxis, Hives, Swelling and Rash   Aspirin Hives   Erythromycin Other (See Comments)    Had a seizure.     Review of Systems: Negative except for what is mentioned in HPI.     Physical Exam: BP (!) 145/89   Pulse 80   Temp 98.9 F (37.2 C) (Oral)   Resp 18   Ht '5\' 6"'$  (1.676 m)   Wt 112 kg   LMP  (LMP Unknown) Comment: Urine Preg negative 05/31/22  SpO2 98%   BMI 39.87 kg/m  CONSTITUTIONAL: Well-developed, well-nourished female in no acute distress.  HENT:  Normocephalic, atraumatic, External right and left ear normal. Oropharynx is clear and moist EYES: Conjunctivae and EOM are normal.  NECK: Normal range of motion, supple, no masses SKIN: Skin is warm and dry. No rash noted. Not diaphoretic. No erythema. No pallor. [place]: Alert and oriented to person, place, and time. Normal reflexes, muscle tone coordination. No cranial nerve deficit noted. PSYCHIATRIC: Normal mood and affect. Normal behavior. Normal judgment and thought content. CARDIOVASCULAR: Normal heart rate noted, regular rhythm RESPIRATORY: Effort and breath sounds normal, no problems with respiration noted ABDOMEN: Soft, nontender, nondistended, gravid. PELVIC: Deferred, cervix easily accessed from previous eval.  Vagina WNL. MUSCULOSKELETAL: Normal range of motion. No edema and no tenderness. 2+ distal pulses.   Pertinent Labs/Studies:   Results for orders placed or performed during the hospital encounter of 05/31/22 (from the past 72 hour(s))  Pregnancy, urine POC     Status: None   Collection Time: 05/31/22  6:21 AM  Result  Value Ref Range   Preg Test, Ur NEGATIVE NEGATIVE    Comment:        THE SENSITIVITY OF THIS METHODOLOGY IS >24 mIU/mL       Latest Ref Rng & Units 05/20/2022    8:44 AM 05/04/2021   11:15 AM 05/01/2020   12:43 PM  CBC  WBC 4.0 - 10.5 K/uL 5.3  6.7  6.1  Hemoglobin 12.0 - 15.0 g/dL 11.5  13.7  15.5   Hematocrit 36.0 - 46.0 % 36.3  42.9  47.9   Platelets 150 - 400 K/uL 320  333  248    CLINICAL DATA:  Initial evaluation for heavy vaginal bleeding. Evaluate IUD placement.   EXAM: ULTRASOUND PELVIS TRANSVAGINAL   TECHNIQUE: Transvaginal ultrasound examination of the pelvis was performed including evaluation of the uterus, ovaries, adnexal regions, and pelvic cul-de-sac.   COMPARISON:  Prior ultrasound from 02/14/2020   FINDINGS: Uterus   Measurements: 12.2 x 10.4 x 9.6 cm = volume: 631.0 mL. Uterus is anteverted. Large intramural fibroid positioned at the central aspect of the uterus measures 9.0 x 7.6 x 7.2 cm. Additional subserosal fibroid at the posterior uterine body measures 2.7 x 3.3 x 2.5 cm   Endometrium   Endometrial stripe obscured by overlying fibroid. Previously seen IUD is not definitely visualized on today's exam, raising the possibility for interval malpositioning.   Right ovary   Not visualized.  No adnexal mass.   Left ovary   Measurements: 3.6 x 2.4 x 2.5 cm = volume: 11.6 mL. Normal appearance/no adnexal mass.   Other findings:  No abnormal free fluid   IMPRESSION: 1. Previously seen IUD not definitely visualized on today's exam, raising the possibility for interval malpositioning. Correlation with x-ray of the pelvis suggested for further evaluation. 2. Enlarged fibroid uterus, with the dominant fibroid now measuring up to 9 cm, increased in size from previous. 3. Endometrial stripe obscured by overlying fibroid, and not assessed on this exam. Further evaluation with dedicated sonohysterography and/or pelvic MRI could be performed for  further evaluation as warranted. 4. Normal sonographic appearance of the left ovary, with nonvisualization of the right ovary. No adnexal mass or free fluid.       Assessment and Plan :Angela Burton is a 51 y.o. [id number] here for [facility].   Plan for TVH, removal of bilateral tubes if possible NPO Admission labs ordered VS Q4    Lynnda Shields, M.D. Attending [place], [facility] for [employer], [place]

## 2022-05-31 NOTE — Op Note (Signed)
Angela Burton PROCEDURE DATE: 05/31/2022  PREOPERATIVE DIAGNOSIS:  Symptomatic fibroids, menorrhagia POSTOPERATIVE DIAGNOSIS:  Symptomatic fibroids, menorrhagia SURGEON:   Lynnda Shields M.D., FACOG ASSISTANT: Arlina Robes, M.D. OPERATION:  Total Vaginal hysterectomy, left salpingectomy ANESTHESIA:  General endotracheal.  INDICATIONS: The patient is a 51 y.o. [id number] with history of symptomatic uterine fibroids/menorrhagia. The patient made a decision to undergo definite surgical treatment. On the preoperative visit, the risks, benefits, indications, and alternatives of the procedure were reviewed with the patient.  On the day of surgery, the risks of surgery were again discussed with the patient including but not limited to: bleeding which may require transfusion or reoperation; infection which may require antibiotics; injury to bowel, bladder, ureters or other surrounding organs; need for additional procedures; thromboembolic phenomenon, incisional problems and other postoperative/anesthesia complications. Written informed consent was obtained.    OPERATIVE FINDINGS: A 13 week size uterus with normal tubes and ovaries bilaterally.  ESTIMATED BLOOD LOSS: 200 ml FLUIDS:  1500 ml of Lactated Ringers URINE OUTPUT:  100 ml of clear yellow urine. SPECIMENS:  Uterus and cervix sent to pathology, left fallopian tube COMPLICATIONS:  None immediate.   An experienced assistant was required given the standard of surgical care given the complexity of the case.  This assistant was needed for exposure, dissection, suctioning, retraction, instrument exchange,  and for overall help during the procedure.   DESCRIPTION OF PROCEDURE:  The patient received intravenous antibiotics and had sequential compression devices applied to her lower extremities while in the preoperative area.  She was then taken to the operating room where general anesthesia was administered and was found to be adequate.  She was placed  in the dorsal lithotomy position, and was prepped and draped in a sterile manner.  The patients bladder was drained with a foley catheter which was left to gravity. After an adequate timeout was performed, attention was turned to her pelvis.  A weighted speculum was then placed in the vagina, and the anterior and posterior lips of the cervix were grasped bilaterally with tenaculums.    The cervix was then circumferentially incised, and the bladder was dissected off the pubocervical fascia without complication.  Th posterior cul-de-sac was entered sharply without difficulty. A suture was placed on the posterior vagina.  A long weighted speculum was inserted into the posterior cul-de-sac.  The Heaney clamp was then used to clamp the uterosacral ligaments on either side.  They were then cut and sutured ligated with 0 Vicryl, and the ligated uterosacral ligaments were transfixed to the posterior lateral vaginal epithelium to further support the vagina and provide hemostasis. Of note, all sutures used in this case were 0 Vicryl unless otherwise noted.   The cardinal ligaments were then clamped, cut and ligated.  The central fibroid was very large and was morcellated to allow better exposure of the pedicles. The anterior cul-de-sac was finally entered sharpely. The uterine vessels and broad ligaments were then serially clamped with the Heaney clamps, cut, and suture ligated on both sides.  Excellent hemostasis was noted at this point.  Due to the size of the uterus, it continued to be morcellated using a coring technique.  The uterus was then delivered via the posterior cul-de-sac, and the cornua were clamped with the Heaney clamps, transected, and the uterus was delivered and sent to pathology. These pedicles were then suture ligated to ensure hemostasis.  After completion of the hysterectomy, The fallopian tube on the left side was grasped with a Kelly clamp, transected and suture  ligated.  The right fallopian tube  could not be seen.  All pedicles from the uterosacral ligament to the cornua were examined hemostasis was confirmed.  The vaginal cuff was reefed in a running locked fashion then reapproximated using figure of eight sutures care was given to incorporate the uterosacral pedicles bilaterally.  All instruments were then removed from the pelvis .  The patient tolerated the procedure well.  All instruments, needles, and sponge counts were correct x 2. The patient was taken to the recovery room in stable condition.

## 2022-05-31 NOTE — Transfer of Care (Signed)
Immediate Anesthesia Transfer of Care Note  Patient: Angela Burton  Procedure(s) Performed: HYSTERECTOMY VAGINAL WITH SALPINGECTOMY (Bilateral)  Patient Location: PACU  Anesthesia Type:General  Level of Consciousness: awake, alert  and oriented  Airway & Oxygen Therapy: Patient Spontanous Breathing and Patient connected to nasal cannula oxygen  Post-op Assessment: Report given to RN, Post -op Vital signs reviewed and stable, Patient moving all extremities X 4 and Patient able to stick tongue midline  Post vital signs: Reviewed  Last Vitals:  Vitals Value Taken Time  BP 157/87 05/31/22 0957  Temp 98.6   Pulse 77 05/31/22 0959  Resp 18 05/31/22 0959  SpO2 100 % 05/31/22 0959  Vitals shown include unvalidated device data.  Last Pain:  Vitals:   05/31/22 0631  TempSrc:   PainSc: 3       Patients Stated Pain Goal: 0 (50/35/46 5681)  Complications: No notable events documented.

## 2022-05-31 NOTE — Progress Notes (Signed)
Per Dr. Elgie Congo it is ok to give Toradol with documented Aspirin and Sulfa allergies.

## 2022-06-01 ENCOUNTER — Encounter (HOSPITAL_COMMUNITY): Payer: Self-pay | Admitting: Obstetrics and Gynecology

## 2022-06-01 DIAGNOSIS — D259 Leiomyoma of uterus, unspecified: Secondary | ICD-10-CM | POA: Diagnosis not present

## 2022-06-01 LAB — CBC
HCT: 25.3 % — ABNORMAL LOW (ref 36.0–46.0)
HCT: 26 % — ABNORMAL LOW (ref 36.0–46.0)
Hemoglobin: 8 g/dL — ABNORMAL LOW (ref 12.0–15.0)
Hemoglobin: 8.1 g/dL — ABNORMAL LOW (ref 12.0–15.0)
MCH: 24.6 pg — ABNORMAL LOW (ref 26.0–34.0)
MCH: 24.5 pg — ABNORMAL LOW (ref 26.0–34.0)
MCHC: 31.6 g/dL (ref 30.0–36.0)
MCHC: 31.2 g/dL (ref 30.0–36.0)
MCV: 77.8 fL — ABNORMAL LOW (ref 80.0–100.0)
MCV: 78.5 fL — ABNORMAL LOW (ref 80.0–100.0)
Platelets: 348 10*3/uL (ref 150–400)
Platelets: 355 10*3/uL (ref 150–400)
RBC: 3.25 MIL/uL — ABNORMAL LOW (ref 3.87–5.11)
RBC: 3.31 MIL/uL — ABNORMAL LOW (ref 3.87–5.11)
RDW: 16.5 % — ABNORMAL HIGH (ref 11.5–15.5)
RDW: 16.5 % — ABNORMAL HIGH (ref 11.5–15.5)
WBC: 13.4 10*3/uL — ABNORMAL HIGH (ref 4.0–10.5)
WBC: 12.9 10*3/uL — ABNORMAL HIGH (ref 4.0–10.5)
nRBC: 0 % (ref 0.0–0.2)
nRBC: 0 % (ref 0.0–0.2)

## 2022-06-01 LAB — SURGICAL PATHOLOGY

## 2022-06-01 MED ORDER — ALUM & MAG HYDROXIDE-SIMETH 200-200-20 MG/5ML PO SUSP: 30.0000 mL | Freq: Four times a day (QID) | ORAL | Status: DC | PRN

## 2022-06-01 MED ORDER — ALBUTEROL SULFATE (2.5 MG/3ML) 0.083% IN NEBU: 2.5000 mg | INHALATION_SOLUTION | Freq: Four times a day (QID) | RESPIRATORY_TRACT | Status: DC | PRN

## 2022-06-01 MED ORDER — LORATADINE 10 MG PO TABS
10.0000 mg | ORAL_TABLET | Freq: Every day | ORAL | Status: DC
  Administered 2022-06-01 – 2022-06-02 (×2): 10 mg via ORAL
  Filled 2022-06-01 (×2): qty 1

## 2022-06-01 MED ORDER — HYDROCHLOROTHIAZIDE 25 MG PO TABS
25.0000 mg | ORAL_TABLET | Freq: Every day | ORAL | Status: DC
  Administered 2022-06-01 – 2022-06-02 (×2): 25 mg via ORAL
  Filled 2022-06-01 (×2): qty 1

## 2022-06-01 MED ORDER — ALBUTEROL SULFATE HFA 108 (90 BASE) MCG/ACT IN AERS: 1.0000 | INHALATION_SPRAY | Freq: Four times a day (QID) | RESPIRATORY_TRACT | Status: DC | PRN

## 2022-06-01 MED ORDER — DIPHENHYDRAMINE HCL 25 MG PO CAPS
25.0000 mg | ORAL_CAPSULE | Freq: Once | ORAL | Status: AC
  Administered 2022-06-01: 25 mg via ORAL
  Filled 2022-06-01: qty 1

## 2022-06-01 MED ORDER — SODIUM CHLORIDE 0.9 % IV SOLN
500.0000 mg | Freq: Once | INTRAVENOUS | Status: AC
  Administered 2022-06-01: 500 mg via INTRAVENOUS
  Filled 2022-06-01: qty 500

## 2022-06-01 NOTE — Progress Notes (Signed)
Gynecology Progress Note  Admission Date: 05/31/2022 Current Date: 06/01/2022 9:54 AM  Angela Burton is a 51 y.o. [id number] POD#1 admitted for vaginal hysterectomy   History complicated by: Patient Active Problem List   Diagnosis Date Noted   Uterine fibroid 05/31/2022   S/P vaginal hysterectomy 05/31/2022   Periodontal disease 08/03/2021   Increased severity of headaches 08/03/2021   Vision changes 08/03/2021   Prediabetes 08/03/2021   IUD threads lost 10/22/2020   Encounter for insertion of progestin-releasing intrauterine contraceptive device (IUD) 06/22/2020   Irregular menses 06/22/2020   Obesity (BMI 35.0-39.9 without comorbidity) 04/22/2020   Hypertension 03/24/2020   Right ovarian cyst 03/24/2020   Fibroid, uterine 03/24/2020   Presence of copper intrauterine contraceptive device 03/24/2020   Smoking history 03/24/2020   Snoring 04/05/2016   Asthma 03/04/2016    ROS and patient/family/surgical history, located on admission H&P note dated 05/31/2022, have been reviewed, and there are no changes except as noted below Yesterday/Overnight Events:  none  Subjective:  Pt seen this morning.  She is ambulating and is passing gas.  Pt states she has had no issues voiding.  Notes she was slightly dizzy last night, but that appears to have resolved today.  Pt did note nausea/vomiting last night, but that has also improved this AM.  She has tolerated clear liquids and is asking to advance her diet.  Pt has noted some chills, but has remained afebrile.  Objective:   Vitals:   05/31/22 2001 05/31/22 2357 06/01/22 0426 06/01/22 0824  BP: (!) 150/77 139/80 (!) 146/76 (!) 160/90  Pulse: 82 83 80 74  Resp: '17 16  17  '$ Temp: 98.2 F (36.8 C) 98.4 F (36.9 C) 98.6 F (37 C) 99.4 F (37.4 C)  TempSrc: Oral Oral Oral Oral  SpO2: 100% 100% 100% 98%  Weight:      Height:        Temp:  [98.1 F (36.7 C)-99.4 F (37.4 C)] 99.4 F (37.4 C) (10/11 0824) Pulse Rate:  [68-83] 74  (10/11 0824) Resp:  [13-25] 17 (10/11 0824) BP: (136-163)/(70-98) 160/90 (10/11 0824) SpO2:  [98 %-100 %] 98 % (10/11 0824) FiO2 (%):  [0 %] 0 % (10/10 1018) I/O last 3 completed shifts: In: 2986.9 [I.V.:2986.9] Out: 725 [Urine:525; Blood:200] Total I/O In: 120 [P.O.:120] Out: -   Intake/Output Summary (Last 24 hours) at 06/01/2022 0954 Last data filed at 06/01/2022 0827 Gross per 24 hour  Intake 2106.9 ml  Output 425 ml  Net 1681.9 ml     Current Vital Signs 24h Vital Sign Ranges  T 99.4 F (37.4 C) Temp  Avg: 98.4 F (36.9 C)  Min: 98.1 F (36.7 C)  Max: 99.4 F (37.4 C)  BP (!) 160/90 BP  Min: 136/70  Max: 163/94  HR 74 Pulse  Avg: 73  Min: 68  Max: 83  RR 17 Resp  Avg: 16.9  Min: 13  Max: 25  SaO2 98 % Room Air SpO2  Avg: 99.9 %  Min: 98 %  Max: 100 %       24 Hour I/O Current Shift I/O  Time Ins Outs 10/10 0701 - 10/11 0700 In: 2986.9 [I.V.:2986.9] Out: 725 [Urine:525] 10/11 0701 - 10/11 1900 In: 120 [P.O.:120] Out: -    Patient Vitals for the past 12 hrs:  BP Temp Temp src Pulse Resp SpO2  06/01/22 0824 (!) 160/90 99.4 F (37.4 C) Oral 74 17 98 %  06/01/22 0426 (!) 146/76 98.6 F (37 C) Oral  80 -- 100 %  05/31/22 2357 139/80 98.4 F (36.9 C) Oral 83 16 100 %     Patient Vitals for the past 24 hrs:  BP Temp Temp src Pulse Resp SpO2  06/01/22 0824 (!) 160/90 99.4 F (37.4 C) Oral 74 17 98 %  06/01/22 0426 (!) 146/76 98.6 F (37 C) Oral 80 -- 100 %  05/31/22 2357 139/80 98.4 F (36.9 C) Oral 83 16 100 %  05/31/22 2001 (!) 150/77 98.2 F (36.8 C) Oral 82 17 100 %  05/31/22 1900 -- 98.1 F (36.7 C) -- -- -- --  05/31/22 1640 136/70 98.1 F (36.7 C) Oral 71 16 100 %  05/31/22 1512 (!) 158/91 98.2 F (36.8 C) Oral 76 -- 100 %  05/31/22 1430 (!) 155/95 -- -- 70 15 100 %  05/31/22 1400 (!) 158/97 -- -- 68 (!) 25 100 %  05/31/22 1345 (!) 159/91 -- -- 72 16 100 %  05/31/22 1330 (!) 163/94 -- -- 71 14 100 %  05/31/22 1300 (!) 159/98 -- -- 74 14 100 %   05/31/22 1245 (!) 151/89 -- -- 74 15 100 %  05/31/22 1230 (!) 158/97 -- -- 73 (!) 24 100 %  05/31/22 1215 (!) 163/86 -- -- 73 17 100 %  05/31/22 1200 (!) 155/90 -- -- 72 16 100 %  05/31/22 1145 (!) 155/85 -- -- 69 13 100 %  05/31/22 1130 (!) 155/90 -- -- 70 (!) 21 100 %  05/31/22 1115 (!) 151/92 -- -- 70 14 100 %  05/31/22 1100 (!) 157/84 -- -- 71 16 100 %  05/31/22 1045 (!) 148/78 -- -- 69 15 100 %  05/31/22 1030 (!) 147/70 -- -- 72 18 100 %  05/31/22 1018 -- -- -- -- -- 100 %  05/31/22 1015 (!) 159/83 -- -- 68 17 100 %  05/31/22 1000 (!) 153/88 98.2 F (36.8 C) -- 77 18 100 %    Physical exam: General appearance: alert, cooperative, appears stated age, no distress, and moderately obese Abdomen: soft, non-tender; bowel sounds normal; no masses,  no organomegaly GU: No gross VB Lungs: clear to auscultation bilaterally Heart: regular rate and rhythm Extremities: no lower extremity edema, SCD in place Skin: WNL, non diaphoretic Psych: appropriate Neurologic: Grossly normal  Medications Current Facility-Administered Medications  Medication Dose Route Frequency Provider Last Rate Last Admin   acetaminophen (TYLENOL) tablet 650 mg  650 mg Oral Q4H PRN [name], MD   650 mg at 06/01/22 0759   albuterol (PROVENTIL) (2.5 MG/3ML) 0.083% nebulizer solution 2.5 mg  2.5 mg Nebulization Q6H PRN [name], MD       docusate sodium (COLACE) capsule 100 mg  100 mg Oral BID [name], MD   100 mg at 06/01/22 0800   enoxaparin (LOVENOX) injection 40 mg  40 mg Subcutaneous Q24H [name], MD   40 mg at 06/01/22 0756   hydrochlorothiazide (HYDRODIURIL) tablet 25 mg  25 mg Oral Daily [name], MD       ibuprofen (ADVIL) tablet 600 mg  600 mg Oral Q6H [name], MD   600 mg at 06/01/22 0009   lactated ringers infusion   Intravenous Continuous [name], MD 125 mL/hr at 06/01/22 0338 Infusion Verify at 06/01/22 0338   loratadine (CLARITIN) tablet 10  mg  10 mg Oral Daily [name], MD       ondansetron [facility]) tablet 4 mg  4  mg Oral Q6H PRN [name], MD       Or   ondansetron [facility]) injection 4 mg  4 mg Intravenous Q6H PRN [name], MD   4 mg at 06/01/22 6256   oxyCODONE (Oxy IR/ROXICODONE) immediate release tablet 5-10 mg  5-10 mg Oral Q4H PRN [name], MD   5 mg at 05/31/22 2106   senna-docusate (Senokot-S) tablet 1 tablet  1 tablet Oral QHS PRN [name], MD       simethicone [facility]) chewable tablet 80 mg  80 mg Oral QID PRN [name], MD          Labs  Recent Labs  Lab 06/01/22 0154  WBC 13.4*  HGB 8.0*  HCT 25.3*  PLT 348     Radiology N/a  Assessment & Plan:  POD 1 s/p TVH *GYN: Pt stable , but slightly larger than expected drop in h/h.  Pt is not tachycardic and BP is normal to elevated.  Will recheck CBC.  If stable will give venofer prior to discharge  *Pain: currently well controlled *FEN/GI: Mild nausea, tolerated clears and wishes to advance diet, positive flatus noted *Dispo: if labs stable anticipate d/c on 06/02/22  Code Status: Full Code   Lynnda Shields, MD Attending Center for [facility])

## 2022-06-01 NOTE — Progress Notes (Signed)
Mobility Specialist - Progress Note   06/01/22 1100  Mobility  Activity Ambulated with assistance in hallway  Level of Assistance Standby assist, set-up cues, supervision of patient - no hands on  Assistive Device Front wheel walker  Distance Ambulated (ft) 200 ft  Activity Response Tolerated well  $Mobility charge 1 Mobility    Pt received in bed agreeable to mobility. Left in bed w/ call bell in reach and all needs met.   Paulla Dolly Mobility Specialist

## 2022-06-01 NOTE — TOC Initial Note (Addendum)
Transition of Care [facility]) - Initial/Assessment Note    Patient Details  Name: Angela Burton MRN: [id number] Date of Birth: October 21, 1970  Transition of Care [facility]) CM/SW Contact:    Ninfa Meeker, RN Phone Number: 06/01/2022, 11:06 AM  Clinical Narrative:   Transition of care Screening Note:  S/p total vaginal hysterectomy Transition of Care Department [facility]) has reviewed patient and no TOC needs have been identified at this time. We will continue to monitor patient advancement through Interdisciplinary progressions. If new patient transition needs arise, please place a consult.                         Patient Goals and CMS Choice        Expected Discharge Plan and Services                                                Prior Living Arrangements/Services                       Activities of Daily Living      Permission Sought/Granted                  Emotional Assessment              Admission diagnosis:  Uterine fibroid [D25.9] S/P vaginal hysterectomy [Z90.710] Patient Active Problem List   Diagnosis Date Noted   Uterine fibroid 05/31/2022   S/P vaginal hysterectomy 05/31/2022   Periodontal disease 08/03/2021   Increased severity of headaches 08/03/2021   Vision changes 08/03/2021   Prediabetes 08/03/2021   IUD threads lost 10/22/2020   Encounter for insertion of progestin-releasing intrauterine contraceptive device (IUD) 06/22/2020   Irregular menses 06/22/2020   Obesity (BMI 35.0-39.9 without comorbidity) 04/22/2020   Hypertension 03/24/2020   Right ovarian cyst 03/24/2020   Fibroid, uterine 03/24/2020   Presence of copper intrauterine contraceptive device 03/24/2020   Smoking history 03/24/2020   Snoring 04/05/2016   Asthma 03/04/2016   PCP:  Elsie Stain, MD Pharmacy:   [facility] [address] [place] [address] Phone: [phone] Fax:  [phone]     Social Determinants of Health (SDOH) Interventions    Readmission Risk Interventions     No data to display

## 2022-06-02 ENCOUNTER — Other Ambulatory Visit: Payer: Self-pay

## 2022-06-02 DIAGNOSIS — D259 Leiomyoma of uterus, unspecified: Secondary | ICD-10-CM | POA: Diagnosis not present

## 2022-06-02 MED ORDER — IBUPROFEN 600 MG PO TABS
600.0000 mg | ORAL_TABLET | Freq: Four times a day (QID) | ORAL | 2 refills | Status: DC | PRN
  Filled 2022-06-02: qty 30, 8d supply, fill #0
  Filled 2022-06-16: qty 30, 8d supply, fill #1
  Filled 2022-10-31: qty 30, 8d supply, fill #2

## 2022-06-02 MED ORDER — OXYCODONE HCL 5 MG PO TABS
5.0000 mg | ORAL_TABLET | Freq: Four times a day (QID) | ORAL | 0 refills | Status: DC | PRN
  Filled 2022-06-02: qty 20, 3d supply, fill #0

## 2022-06-02 NOTE — Plan of Care (Signed)

## 2022-06-02 NOTE — Progress Notes (Signed)
Mobility Specialist - Progress Note   06/02/22 1008  Mobility  Activity Ambulated independently in hallway  Level of Assistance Modified independent, requires aide device or extra time  Assistive Device Other (Comment) (IV Pole)  Distance Ambulated (ft) 200 ft  Activity Response Tolerated well  $Mobility charge 1 Mobility   Pt received in BR agreeable to mobility. Left EOB w/ call bell in reach and all needs met.   Paulla Dolly Mobility Specialist

## 2022-06-02 NOTE — Discharge Summary (Signed)
Physician Discharge Summary  Patient ID: Angela Burton MRN: [id number] DOB/AGE: Aug 22, 1971 51 y.o.  Admit date: 05/31/2022 Discharge date: 06/02/2022  Admission Diagnoses: symptomatic uterine fibroids  Discharge Diagnoses:  Principal Problem:   Uterine fibroid Active Problems:   S/P vaginal hysterectomy   Discharged Condition: good  Hospital Course: Pt admitted for total vaginal hysterectomy.  Please see separate op note.  Pt did well post op.  POD 1 pt did have a decrease in hbg to 8.  Labs were rechecked and were stable at 8.1.  Pt was not symptomatic.  Pt received an iron infused and stated that she felt more energy afterwards.  Pt continued to do well and was discharged home on POD 2  Consults: None  Significant Diagnostic Studies: labs: cbc  Treatments: anticoagulation: LMW heparin  Discharge Exam: Blood pressure (!) 145/91, pulse 70, temperature 98.8 F (37.1 C), temperature source Oral, resp. rate 16, height '5\' 6"'$  (1.676 m), weight 112 kg, SpO2 99 %. General appearance: alert, cooperative, appears stated age, no distress, and moderately obese Head: Normocephalic, without obvious abnormality, atraumatic Resp: clear to auscultation bilaterally Cardio: regular rate and rhythm GI: soft, non-tender; bowel sounds normal; no masses,  no organomegaly Extremities: extremities normal, atraumatic, no cyanosis or edema and Homans sign is negative, no sign of DVT  Disposition: Discharge disposition: 01-Home or Self Care       Discharge Instructions     Call MD for:  difficulty breathing, headache or visual disturbances   Complete by: As directed    Call MD for:  hives   Complete by: As directed    Call MD for:  persistant dizziness or light-headedness   Complete by: As directed    Call MD for:  persistant nausea and vomiting   Complete by: As directed    Call MD for:  severe uncontrolled pain   Complete by: As directed    Call MD for:  temperature >100.4   Complete  by: As directed    Diet - low sodium heart healthy   Complete by: As directed    Driving Restrictions   Complete by: As directed    No driving for 1-2 weeks or while taking narcotic medication   Increase activity slowly   Complete by: As directed    Lifting restrictions   Complete by: As directed    No lifting more than 15 pounds for 1-2 weeks   Sexual Activity Restrictions   Complete by: As directed    Pelvic rest 4-5 weeks      Allergies as of 06/02/2022       Reactions   Sulfa Antibiotics Anaphylaxis, Hives, Swelling, Rash   Aspirin Hives   Able to take ibuprofen   Erythromycin Other (See Comments)   Had a seizure.         Medication List     TAKE these medications    albuterol 108 (90 Base) MCG/ACT inhaler Commonly known as: Ventolin HFA Inhale 1-2 puffs into the lungs every 6 (six) hours as needed for wheezing or shortness of breath.   albuterol (2.5 MG/3ML) 0.083% nebulizer solution Commonly known as: PROVENTIL USE 1 VIAL VIA NEBULIZER EVERY 6 HOURS AS NEEDED FOR WHEEZING OR SHORTNESS OF BREATH   cetirizine 10 MG tablet Commonly known as: ZYRTEC Take 1 tablet (10 mg total) by mouth daily.   fluticasone-salmeterol 100-50 MCG/ACT Aepb Commonly known as: Advair Diskus Inhale 1 puff into the lungs 2 (two) times daily.   hydrochlorothiazide 25 MG tablet Commonly  known as: HYDRODIURIL TAKE 1 TABLET(25 MG) BY MOUTH DAILY (Must have office visit for refills)   ibuprofen 600 MG tablet Commonly known as: ADVIL Take 1 tablet (600 mg total) by mouth every 6 (six) hours as needed for mild pain or moderate pain.   oxyCODONE 5 MG immediate release tablet Commonly known as: Oxy IR/ROXICODONE Take 1-2 tablets (5-10 mg total) by mouth every 6 (six) hours as needed for moderate pain.        Follow-up [place] [facility]. Schedule an appointment as soon as possible for a visit in 1 month(s).    Specialty: Obstetrics and Gynecology Why: follow up with Elgie Congo for post op Contact information: [place] [address] [phone]                Signed: [name] 06/02/2022, 9:04 AM

## 2022-06-06 ENCOUNTER — Telehealth: Payer: Self-pay

## 2022-06-06 NOTE — Telephone Encounter (Signed)
Transition Care Management Follow-up Telephone Call Date of discharge and from where: 06/02/2022, [facility] How have you been since you were released from the hospital? She said she is still in a lot of pain and does not have any energy.  She has been trying to get up and walk around, make a sandwich  Any questions or concerns? No  Items Reviewed: Did the pt receive and understand the discharge instructions provided? Yes  Medications obtained and verified? Yes - she stated that she has all of her medications  Other? No  Any new allergies since your discharge? No  Dietary orders reviewed? No Do you have support at home? Yes   Home Care and Equipment/Supplies: Were home health services ordered? no If so, what is the name of the agency? N/a  Has the agency set up a time to come to the patient's home? not applicable Were any new equipment or medical supplies ordered?  No What is the name of the medical supply agency? N/a Were you able to get the supplies/equipment? not applicable Do you have any questions related to the use of the equipment or supplies? No  Functional Questionnaire: (I = Independent and D = Dependent) ADLs: independent.   Follow up appointments reviewed:  PCP Hospital f/u appt confirmed?  She didn't want to schedule an appointment with another provider.  She said she will call when she is feeling better but plans to follow up with Dr [name].    [facility] f/u appt confirmed? Yes  Scheduled to see OB/GYN- 07/08/2022   Are transportation arrangements needed? No  If their condition worsens, is the pt aware to call PCP or go to the Emergency Dept.? Yes Was the patient provided with contact information for the PCP's office or ED? Yes Was to pt encouraged to call back with questions or concerns? Yes

## 2022-06-13 ENCOUNTER — Other Ambulatory Visit: Payer: Self-pay | Admitting: Critical Care Medicine

## 2022-06-13 DIAGNOSIS — J4520 Mild intermittent asthma, uncomplicated: Secondary | ICD-10-CM

## 2022-06-13 DIAGNOSIS — I10 Essential (primary) hypertension: Secondary | ICD-10-CM

## 2022-06-14 ENCOUNTER — Other Ambulatory Visit: Payer: Self-pay

## 2022-06-16 ENCOUNTER — Other Ambulatory Visit: Payer: Self-pay | Admitting: Critical Care Medicine

## 2022-06-16 ENCOUNTER — Other Ambulatory Visit: Payer: Self-pay

## 2022-06-16 DIAGNOSIS — I10 Essential (primary) hypertension: Secondary | ICD-10-CM

## 2022-06-20 ENCOUNTER — Other Ambulatory Visit: Payer: Self-pay

## 2022-06-21 ENCOUNTER — Other Ambulatory Visit: Payer: Self-pay | Admitting: Critical Care Medicine

## 2022-06-21 ENCOUNTER — Other Ambulatory Visit: Payer: Self-pay

## 2022-06-21 DIAGNOSIS — I10 Essential (primary) hypertension: Secondary | ICD-10-CM

## 2022-06-21 DIAGNOSIS — J4520 Mild intermittent asthma, uncomplicated: Secondary | ICD-10-CM

## 2022-06-21 MED ORDER — HYDROCHLOROTHIAZIDE 25 MG PO TABS
25.0000 mg | ORAL_TABLET | Freq: Every day | ORAL | 0 refills | Status: DC
  Filled 2022-06-21: qty 30, 30d supply, fill #0

## 2022-06-21 MED ORDER — FLUTICASONE-SALMETEROL 100-50 MCG/ACT IN AEPB
1.0000 | INHALATION_SPRAY | Freq: Two times a day (BID) | RESPIRATORY_TRACT | 0 refills | Status: DC
  Filled 2022-06-21: qty 60, 30d supply, fill #0

## 2022-06-21 MED ORDER — ALBUTEROL SULFATE (2.5 MG/3ML) 0.083% IN NEBU
2.5000 mg | INHALATION_SOLUTION | Freq: Four times a day (QID) | RESPIRATORY_TRACT | 0 refills | Status: DC | PRN
  Filled 2022-06-21: qty 180, 15d supply, fill #0

## 2022-06-21 MED ORDER — CETIRIZINE HCL 10 MG PO TABS
10.0000 mg | ORAL_TABLET | Freq: Every day | ORAL | 0 refills | Status: DC
  Filled 2022-06-21: qty 30, 30d supply, fill #0

## 2022-06-29 ENCOUNTER — Other Ambulatory Visit: Payer: Self-pay

## 2022-06-29 ENCOUNTER — Encounter: Payer: Self-pay | Admitting: Physician Assistant

## 2022-06-29 ENCOUNTER — Ambulatory Visit: Payer: Commercial Managed Care - HMO | Attending: Family Medicine | Admitting: Physician Assistant

## 2022-06-29 VITALS — BP 145/88 | HR 74 | Temp 98.6°F | Ht 66.0 in | Wt 241.0 lb

## 2022-06-29 DIAGNOSIS — Z09 Encounter for follow-up examination after completed treatment for conditions other than malignant neoplasm: Secondary | ICD-10-CM

## 2022-06-29 DIAGNOSIS — I10 Essential (primary) hypertension: Secondary | ICD-10-CM | POA: Diagnosis not present

## 2022-06-29 DIAGNOSIS — R7303 Prediabetes: Secondary | ICD-10-CM

## 2022-06-29 DIAGNOSIS — J4520 Mild intermittent asthma, uncomplicated: Secondary | ICD-10-CM | POA: Diagnosis not present

## 2022-06-29 DIAGNOSIS — T801XXD Vascular complications following infusion, transfusion and therapeutic injection, subsequent encounter: Secondary | ICD-10-CM

## 2022-06-29 MED ORDER — ALBUTEROL SULFATE HFA 108 (90 BASE) MCG/ACT IN AERS
1.0000 | INHALATION_SPRAY | Freq: Four times a day (QID) | RESPIRATORY_TRACT | 0 refills | Status: DC | PRN
  Filled 2022-06-29: qty 6.7, 25d supply, fill #0

## 2022-06-29 MED ORDER — AMLODIPINE BESYLATE 5 MG PO TABS
5.0000 mg | ORAL_TABLET | Freq: Every day | ORAL | 1 refills | Status: DC
  Filled 2022-06-29: qty 30, 30d supply, fill #0
  Filled 2022-07-26: qty 30, 30d supply, fill #1

## 2022-06-29 MED ORDER — FLUTICASONE-SALMETEROL 100-50 MCG/ACT IN AEPB
1.0000 | INHALATION_SPRAY | Freq: Two times a day (BID) | RESPIRATORY_TRACT | 0 refills | Status: DC
  Filled 2022-06-29 – 2022-07-26 (×3): qty 60, 30d supply, fill #0

## 2022-06-29 MED ORDER — CETIRIZINE HCL 10 MG PO TABS
10.0000 mg | ORAL_TABLET | Freq: Every day | ORAL | 11 refills | Status: DC
  Filled 2022-06-29 – 2022-07-26 (×3): qty 30, 30d supply, fill #0
  Filled 2022-10-07 – 2022-11-09 (×4): qty 30, 30d supply, fill #1
  Filled 2023-01-31 – 2023-02-27 (×3): qty 30, 30d supply, fill #2
  Filled 2023-04-09 – 2023-04-10 (×2): qty 30, 30d supply, fill #3
  Filled 2023-05-10 (×2): qty 30, 30d supply, fill #4

## 2022-06-29 MED ORDER — RANITIDINE HCL 75 MG PO TABS
75.0000 mg | ORAL_TABLET | Freq: Two times a day (BID) | ORAL | 0 refills | Status: DC
  Filled 2022-06-29 – 2022-10-31 (×5): qty 60, 30d supply, fill #0

## 2022-06-29 MED ORDER — HYDROCHLOROTHIAZIDE 25 MG PO TABS
25.0000 mg | ORAL_TABLET | Freq: Every day | ORAL | 1 refills | Status: DC
  Filled 2022-06-29: qty 90, 90d supply, fill #0
  Filled 2022-07-18 – 2022-07-26 (×2): qty 30, 30d supply, fill #0
  Filled 2022-10-07 – 2022-10-17 (×2): qty 30, 30d supply, fill #1
  Filled 2022-10-31: qty 30, 30d supply, fill #2

## 2022-06-29 NOTE — Progress Notes (Signed)
Patient ID: Angela Burton, female   DOB: May 26, 1971, 51 y.o.   MRN: [id number]     Feliciana Narayan, is a 51 y.o. female  KCL:[id number]  CBS:[id number]  DOB - 1971/08/22  Chief Complaint  Patient presents with   Medication Refill    Medication f/u.  Refil on abuterol, Covid positive in August, no taste, out of breath.  L hand swoen, itchy. Hysterectomy in Oct 2023, IV had iron infusion in L hand.        Subjective:   Angela Burton is a 51 y.o. female here today for a follow up After hospitalization 10/10-10/07/2022.  She is doing great s/p hysterectomy and feels amazing to no longer be bleeding.    She is having some tenderness in her L dorsum hand where they were doing and IV.  It infiltrated and they had to move it to her R arm.  Her hand was very swollen and tender.  This has gotten a lot better but is still itching and awakens her from sleep.  No fever.  Definitely continues to improve but slowly.   Patient BP OOO ~135-150/85-95.  When she was first started on BP meds, she was started on amlodipine 10 and HCTZ 25 at one time.  She was getting dizzy and not feeling well so amlodipine was stopped.    From discharge summary: Discharge Diagnoses:  Principal Problem:   Uterine fibroid Active Problems:   S/P vaginal hysterectomy  Hospital Course: Pt admitted for total vaginal hysterectomy.  Please see separate op note.  Pt did well post op.  POD 1 pt did have a decrease in hbg to 8.  Labs were rechecked and were stable at 8.1.  Pt was not symptomatic.  Pt received an iron infused and stated that she felt more energy afterwards.  Pt continued to do well and was discharged home on POD 2  No problems updated.  ALLERGIES: Allergies  Allergen Reactions   Sulfa Antibiotics Anaphylaxis, Hives, Swelling and Rash   Aspirin Hives    Able to take ibuprofen   Erythromycin Other (See Comments)    Had a seizure.     PAST MEDICAL HISTORY: Past Medical History:  Diagnosis Date   Asthma     albuterol   Complication of anesthesia    Patient states she had to be revived after her tubes were tied years ago   Diabetes mellitus without complication ([place])    Diagnosed years ago; no meds in 10 years after losing weight   Hypertension    Hyperthyroidism    Obesity     MEDICATIONS AT HOME: Prior to Admission medications   Medication Sig Start Date End Date Taking? Authorizing Provider  albuterol (PROVENTIL) (2.5 MG/3ML) 0.083% nebulizer solution USE 1 VIAL VIA NEBULIZER EVERY 6 HOURS AS NEEDED FOR WHEEZING OR SHORTNESS OF BREATH 06/21/22  Yes Elsie Stain, MD  amLODipine (NORVASC) 5 MG tablet Take 1 tablet (5 mg total) by mouth daily. 06/29/22  Yes Argentina Donovan, PA-C  cetirizine (ZYRTEC) 10 MG tablet Take 1 tablet (10 mg total) by mouth daily. 06/29/22  Yes Jarmarcus Wambold M, PA-C  ibuprofen (ADVIL) 600 MG tablet Take 1 tablet (600 mg total) by mouth every 6 (six) hours as needed for mild pain or moderate pain. 06/02/22  Yes [name], MD  ranitidine (ZANTAC 75) 75 MG tablet Take 1 tablet (75 mg total) by mouth 2 (two) times daily. 06/29/22  Yes Argentina Donovan, PA-C  albuterol (VENTOLIN HFA) 108 (  90 Base) MCG/ACT inhaler Inhale 1-2 puffs into the lungs every 6 (six) hours as needed for wheezing or shortness of breath. 06/29/22   Argentina Donovan, PA-C  fluticasone-salmeterol (ADVAIR DISKUS) 100-50 MCG/ACT AEPB Inhale 1 puff into the lungs 2 (two) times daily. 06/29/22   Argentina Donovan, PA-C  hydrochlorothiazide (HYDRODIURIL) 25 MG tablet Take 1 tablet (25 mg total) by mouth daily.Must have office visit for refills. 06/29/22   Argentina Donovan, PA-C  oxyCODONE (OXY IR/ROXICODONE) 5 MG immediate release tablet Take 1-2 tablets (5-10 mg total) by mouth every 6 (six) hours as needed for moderate pain. Patient not taking: Reported on 06/29/2022 06/02/22   [name], MD    ROS: Neg HEENT Neg resp Neg cardiac Neg GI Neg GU Neg psych Neg neuro  Objective:    Vitals:   06/29/22 1121  BP: (!) 145/88  Pulse: 74  Temp: 98.6 F (37 C)  TempSrc: Oral  SpO2: 99%  Weight: 241 lb (109.3 kg)  Height: '5\' 6"'$  (1.676 m)   Exam General appearance : Awake, alert, not in any distress. Speech Clear. Not toxic looking HEENT: Atraumatic and Normocephalic Neck: Supple, no JVD. No cervical lymphadenopathy.  Chest: Good air entry bilaterally, CTAB.  No rales/rhonchi/wheezing CVS: S1 S2 regular, no murmurs.  Extremities: B/L Lower Ext shows no edema, both legs are warm to touch.  L dorsum of hand with minimal darkening of skin and minimal swelling, no erythema, no warmth/inflammation.  Neurology: Awake alert, and oriented X 3, CN II-XII intact, Non focal Skin: No Rash  Data Review Lab Results  Component Value Date   HGBA1C 6.2 (H) 08/03/2021   HGBA1C 6.1 (H) 05/04/2021    Assessment & Plan   1. Mild intermittent asthma without complication stable - albuterol (VENTOLIN HFA) 108 (90 Base) MCG/ACT inhaler; Inhale 1-2 puffs into the lungs every 6 (six) hours as needed for wheezing or shortness of breath.  Dispense: 6.7 g; Refill: 0  2. Essential hypertension Not at goal.  Continue HCT and add low dose amlodine.  She will check readings at home and bring to next visit - hydrochlorothiazide (HYDRODIURIL) 25 MG tablet; Take 1 tablet (25 mg total) by mouth daily.Must have office visit for refills.  Dispense: 90 tablet; Refill: 1 - amLODipine (NORVASC) 5 MG tablet; Take 1 tablet (5 mg total) by mouth daily.  Dispense: 90 tablet; Refill: 1 - Basic metabolic panel; Future  3. Intravenous infiltration, subsequent encounter Resolving-these may help with itching-try for about 3-4 weeks - cetirizine (ZYRTEC) 10 MG tablet; Take 1 tablet (10 mg total) by mouth daily.  Dispense: 30 tablet; Refill: 11 - ranitidine (ZANTAC 75) 75 MG tablet; Take 1 tablet (75 mg total) by mouth 2 (two) times daily.  Dispense: 60 tablet; Refill: 0  4. Hospital discharge  follow-up Doing well from surgery.  5. Prediabetes I have had a lengthy discussion and provided education about insulin resistance and the intake of too much sugar/refined carbohydrates.  I have advised the patient to work at a goal of eliminating sugary drinks, candy, desserts, sweets, refined sugars, processed foods, and white carbohydrates.  The patient expresses understanding.  - Hemoglobin A1c; Future     Return for 1 month with [name] for BP/lab and 4 months with PCP.  The patient was given clear instructions to go to ER or return to medical center if symptoms don't improve, worsen or new problems develop. The patient verbalized understanding. The patient was told to call to get  lab results if they haven't heard anything in the next week.      Freeman Caldron, PA-C [facility] [place], [place]   06/29/2022, 11:56 AM

## 2022-07-01 ENCOUNTER — Other Ambulatory Visit: Payer: Self-pay

## 2022-07-08 ENCOUNTER — Encounter: Payer: Self-pay | Admitting: Obstetrics and Gynecology

## 2022-07-08 ENCOUNTER — Ambulatory Visit (INDEPENDENT_AMBULATORY_CARE_PROVIDER_SITE_OTHER): Payer: Commercial Managed Care - HMO | Admitting: Obstetrics and Gynecology

## 2022-07-08 VITALS — BP 116/71 | HR 79 | Ht 66.0 in | Wt 249.8 lb

## 2022-07-08 DIAGNOSIS — Z1231 Encounter for screening mammogram for malignant neoplasm of breast: Secondary | ICD-10-CM

## 2022-07-08 DIAGNOSIS — Z5189 Encounter for other specified aftercare: Secondary | ICD-10-CM

## 2022-07-08 NOTE — Progress Notes (Signed)
    Subjective:    Angela Burton is a 51 y.o. female who presents to the clinic status post total vaginal hysterectomy on 05/31/22. The patient is not having any pain.  Eating a regular diet without difficulty. Bowel movements are normal. No other significant postoperative concerns.  The following portions of the patient's history were reviewed and updated as appropriate: allergies, current medications, past family history, past medical history, past social history, past surgical history, and problem list..  Last pap smear was normal on 07/12/20.  Review of Systems Pertinent items are noted in HPI.   Objective:   BP 116/71   Pulse 79   Ht '5\' 6"'$  (1.676 m)   Wt 249 lb 12.8 oz (113.3 kg)   LMP  (LMP Unknown)   BMI 40.32 kg/m  Constitutional:  Well-developed, well-nourished female in no acute distress.   Skin: Skin is warm and dry, no rash noted, not diaphoretic,no erythema, no pallor.  Cardiovascular: Normal heart rate noted  Respiratory: Effort and breath sounds normal, no problems with respiration noted  Abdomen: Soft, bowel sounds active, non-tender, no abnormal masses  Incision: N/a  Pelvic:   Normal appearing external genitalia; normal appearing vaginal mucosa and well-healing vaginal cuff with sutures visible.  No abnormal discharge noted.   Surgical pathology () Multiple fibroids, adenomyosis, benign cervix Assessment:   Doing well postoperatively.  Operative findings again reviewed. Pathology report discussed.   Plan:   1. Continue any current medications. 2. Wound care discussed. 3. Activity restrictions: none, ok to resume baths and intercourse 4. Anticipated return to work: now. 5. Follow up as needed 6.  Routine preventative health maintenance measures emphasized.  Pt overdue for mammogram, order placed for screening mammogram Please refer to After Visit Summary for other counseling recommendations.    Lynnda Shields, MD, [place] Attending [place] for [facility], [place]

## 2022-07-12 ENCOUNTER — Encounter: Payer: Self-pay | Admitting: Obstetrics and Gynecology

## 2022-07-18 ENCOUNTER — Other Ambulatory Visit: Payer: Self-pay | Admitting: Family Medicine

## 2022-07-18 ENCOUNTER — Other Ambulatory Visit: Payer: Self-pay

## 2022-07-18 MED ORDER — OMEPRAZOLE 40 MG PO CPDR
40.0000 mg | DELAYED_RELEASE_CAPSULE | Freq: Every day | ORAL | 1 refills | Status: DC
  Filled 2022-07-18 – 2022-07-26 (×2): qty 30, 30d supply, fill #0
  Filled 2022-10-07 – 2022-10-17 (×2): qty 30, 30d supply, fill #1

## 2022-07-20 ENCOUNTER — Ambulatory Visit: Payer: Commercial Managed Care - HMO | Admitting: Physician Assistant

## 2022-07-21 ENCOUNTER — Other Ambulatory Visit: Payer: Self-pay

## 2022-07-25 ENCOUNTER — Other Ambulatory Visit: Payer: Self-pay

## 2022-07-26 ENCOUNTER — Other Ambulatory Visit: Payer: Self-pay

## 2022-08-04 ENCOUNTER — Ambulatory Visit: Payer: Commercial Managed Care - HMO | Attending: Critical Care Medicine | Admitting: Pharmacist

## 2022-08-04 ENCOUNTER — Other Ambulatory Visit: Payer: Self-pay

## 2022-08-04 ENCOUNTER — Encounter: Payer: Self-pay | Admitting: Pharmacist

## 2022-08-04 ENCOUNTER — Ambulatory Visit: Payer: Commercial Managed Care - HMO

## 2022-08-04 VITALS — BP 133/86 | HR 70

## 2022-08-04 DIAGNOSIS — I10 Essential (primary) hypertension: Secondary | ICD-10-CM

## 2022-08-04 DIAGNOSIS — R7303 Prediabetes: Secondary | ICD-10-CM

## 2022-08-04 MED ORDER — VALSARTAN 40 MG PO TABS
40.0000 mg | ORAL_TABLET | Freq: Every day | ORAL | 1 refills | Status: DC
  Filled 2022-08-04: qty 30, 30d supply, fill #0
  Filled 2022-10-07 – 2022-10-31 (×2): qty 30, 30d supply, fill #1

## 2022-08-04 NOTE — Progress Notes (Signed)
S:     No chief complaint on file.  51 y.o. female who presents for hypertension evaluation, education, and management.  PMH is significant for HTN, asthma, preDM. Patient was referred and last seen by Freeman Caldron on 06/29/2022. She restarted the patient on low-dose amlodipine.   Today, patient arrives in good spirits and presents without assistance. Denies dizziness, blurred vision, swelling. Unfortunately, she developed a HA shortly after starting amlodipine. She is disappointed because her BP has been good since adding the amlodipine. She stopped this ~2 days ago and her HA resolved.   Patient reports hypertension is longstanding.   Family/Social history:  Fhx: MI, DM, HTN, HLD Tobacco: 0.15 PPD some day smoker Alcohol: none reported   Medication adherence reported, however, she endorses HA of amlodipine . Patient has taken BP medications today.   Current antihypertensives include: amlodipine 5 mg daily, HCTZ 25 mg daily  Antihypertensives tried in the past:  -lisinopril/HCTZ in past - ineffective -losartan/HCTZ in past - ineffective  Reported home BP readings:  -Endorses 128/90 mmHg yesterday -119/78 mmHg before medication this morning   Patient reported dietary habits:  -compliant with salt restriction -2 cups of coffee in the morning. [place] once daily  Patient-reported exercise habits:  -Active/10,000 steps at her job  O:  Vitals:   08/04/22 0844  BP: 133/86  Pulse: 70    Last 3 Office BP readings: BP Readings from Last 3 Encounters:  08/04/22 133/86  07/08/22 116/71  06/29/22 (!) 145/88    BMET    Component Value Date/Time   NA 140 05/20/2022 0844   NA 140 08/03/2021 0916   K 3.8 05/20/2022 0844   CL 110 05/20/2022 0844   CO2 23 05/20/2022 0844   GLUCOSE 128 (H) 05/20/2022 0844   BUN 6 05/20/2022 0844   BUN 16 08/03/2021 0916   CREATININE 0.80 05/20/2022 0844   CALCIUM 8.7 (L) 05/20/2022 0844   GFRNONAA >60 05/20/2022 0844   GFRAA >60  05/01/2020 1243    Renal function: CrCl cannot be calculated (Patient's most recent lab result is older than the maximum 21 days allowed.).  Clinical ASCVD: No  The 10-year ASCVD risk score (Arnett DK, et al., 2019) is: 8.3%   Values used to calculate the score:     Age: 50 years     Sex: Female     Is Non-Hispanic African American: Yes     Diabetic: No     Tobacco smoker: Yes     Systolic Blood Pressure: 426 mmHg     Is BP treated: Yes     HDL Cholesterol: 51 mg/dL     Total Cholesterol: 202 mg/dL  A/P: Hypertension longstanding currently above goal on current medications. BP goal < 130/80 mmHg. Medication adherence appears appropriate, however, she had to stop amlodipine d/t HA. Will stop this and try valsartan instead.  -Continued HCTZ 25 mg daily. -Stop amlodipine d/t HA. -Start valsartan 40 mg daily.    -Patient educated on purpose, proper use, and potential adverse effects of valsartan.  -F/u labs ordered - labs per [name] -Counseled on lifestyle modifications for blood pressure control including reduced dietary sodium, increased exercise, adequate sleep. -Encouraged patient to check BP at home and bring log of readings to next visit. Counseled on proper use of home BP cuff.    Results reviewed and written information provided.    Written patient instructions provided. Patient verbalized understanding of treatment plan.  Total time in face to face counseling  20 minutes.    Follow-up:  Pharmacist in 1 month. PCP clinic visit in March.  Benard Halsted, PharmD, [place], [place] [phone]

## 2022-08-05 LAB — HEMOGLOBIN A1C
Est. average glucose Bld gHb Est-mCnc: 134 mg/dL
Hgb A1c MFr Bld: 6.3 % — ABNORMAL HIGH (ref 4.8–5.6)

## 2022-08-05 LAB — BASIC METABOLIC PANEL
BUN/Creatinine Ratio: 8 — ABNORMAL LOW (ref 9–23)
BUN: 8 mg/dL (ref 6–24)
CO2: 25 mmol/L (ref 20–29)
Calcium: 9.6 mg/dL (ref 8.7–10.2)
Chloride: 100 mmol/L (ref 96–106)
Creatinine, Ser: 0.96 mg/dL (ref 0.57–1.00)
Glucose: 125 mg/dL — ABNORMAL HIGH (ref 70–99)
Potassium: 4.3 mmol/L (ref 3.5–5.2)
Sodium: 141 mmol/L (ref 134–144)
eGFR: 73 mL/min/{1.73_m2} (ref >59)

## 2022-08-09 NOTE — Progress Notes (Signed)
Spoke with patient re: recent labs results per Freeman Caldron, PA-C Prediabetes increased a little.  Work to eliminate sugars and starches from your diet to reduce the risk of developing diabetes.  Kidney function and electrolytes are normal.  Patient denies any questions at this time. Verbalizes understanding of all discussed.

## 2022-09-12 ENCOUNTER — Ambulatory Visit: Payer: Commercial Managed Care - HMO | Admitting: Pharmacist

## 2022-10-07 ENCOUNTER — Other Ambulatory Visit: Payer: Self-pay

## 2022-10-13 ENCOUNTER — Other Ambulatory Visit: Payer: Self-pay

## 2022-10-17 ENCOUNTER — Other Ambulatory Visit: Payer: Self-pay

## 2022-10-22 ENCOUNTER — Encounter (HOSPITAL_BASED_OUTPATIENT_CLINIC_OR_DEPARTMENT_OTHER): Payer: Self-pay | Admitting: Emergency Medicine

## 2022-10-22 ENCOUNTER — Emergency Department (HOSPITAL_BASED_OUTPATIENT_CLINIC_OR_DEPARTMENT_OTHER)
Admission: EM | Admit: 2022-10-22 | Discharge: 2022-10-22 | Disposition: A | Discharge status: Home / Self Care | Payer: Commercial Managed Care - HMO | Attending: Emergency Medicine | Admitting: Emergency Medicine

## 2022-10-22 ENCOUNTER — Other Ambulatory Visit: Payer: Self-pay

## 2022-10-22 DIAGNOSIS — E039 Hypothyroidism, unspecified: Secondary | ICD-10-CM | POA: Diagnosis not present

## 2022-10-22 DIAGNOSIS — Z79899 Other long term (current) drug therapy: Secondary | ICD-10-CM | POA: Insufficient documentation

## 2022-10-22 DIAGNOSIS — J45909 Unspecified asthma, uncomplicated: Secondary | ICD-10-CM | POA: Diagnosis not present

## 2022-10-22 DIAGNOSIS — E119 Type 2 diabetes mellitus without complications: Secondary | ICD-10-CM | POA: Insufficient documentation

## 2022-10-22 DIAGNOSIS — I10 Essential (primary) hypertension: Secondary | ICD-10-CM | POA: Insufficient documentation

## 2022-10-22 DIAGNOSIS — R55 Syncope and collapse: Secondary | ICD-10-CM | POA: Diagnosis present

## 2022-10-22 DIAGNOSIS — Z7951 Long term (current) use of inhaled steroids: Secondary | ICD-10-CM | POA: Insufficient documentation

## 2022-10-22 LAB — URINALYSIS, ROUTINE W REFLEX MICROSCOPIC
Bilirubin Urine: NEGATIVE
Glucose, UA: NEGATIVE mg/dL
Hgb urine dipstick: NEGATIVE
Ketones, ur: NEGATIVE mg/dL
Leukocytes,Ua: NEGATIVE
Nitrite: NEGATIVE
Protein, ur: NEGATIVE mg/dL
Specific Gravity, Urine: 1.022 (ref 1.005–1.030)
pH: 5 (ref 5.0–8.0)

## 2022-10-22 LAB — CBC WITH DIFFERENTIAL/PLATELET
Abs Immature Granulocytes: 0.03 10*3/uL (ref 0.00–0.07)
Basophils Absolute: 0 10*3/uL (ref 0.0–0.1)
Basophils Relative: 0 %
Eosinophils Absolute: 0.1 10*3/uL (ref 0.0–0.5)
Eosinophils Relative: 1 %
HCT: 39.4 % (ref 36.0–46.0)
Hemoglobin: 12.2 g/dL (ref 12.0–15.0)
Immature Granulocytes: 0 %
Lymphocytes Relative: 39 %
Lymphs Abs: 3.5 10*3/uL (ref 0.7–4.0)
MCH: 23.7 pg — ABNORMAL LOW (ref 26.0–34.0)
MCHC: 31 g/dL (ref 30.0–36.0)
MCV: 76.7 fL — ABNORMAL LOW (ref 80.0–100.0)
Monocytes Absolute: 0.7 10*3/uL (ref 0.1–1.0)
Monocytes Relative: 8 %
Neutro Abs: 4.6 10*3/uL (ref 1.7–7.7)
Neutrophils Relative %: 52 %
Platelets: 372 10*3/uL (ref 150–400)
RBC: 5.14 MIL/uL — ABNORMAL HIGH (ref 3.87–5.11)
RDW: 19.5 % — ABNORMAL HIGH (ref 11.5–15.5)
WBC: 8.9 10*3/uL (ref 4.0–10.5)
nRBC: 0 % (ref 0.0–0.2)

## 2022-10-22 LAB — COMPREHENSIVE METABOLIC PANEL
ALT: 12 U/L (ref 0–44)
AST: 11 U/L — ABNORMAL LOW (ref 15–41)
Albumin: 3.9 g/dL (ref 3.5–5.0)
Alkaline Phosphatase: 80 U/L (ref 38–126)
Anion gap: 6 (ref 5–15)
BUN: 15 mg/dL (ref 6–20)
CO2: 32 mmol/L (ref 22–32)
Calcium: 9.6 mg/dL (ref 8.9–10.3)
Chloride: 99 mmol/L (ref 98–111)
Creatinine, Ser: 0.93 mg/dL (ref 0.44–1.00)
GFR, Estimated: >60 mL/min (ref >60)
Glucose, Bld: 95 mg/dL (ref 70–99)
Potassium: 3.8 mmol/L (ref 3.5–5.1)
Sodium: 137 mmol/L (ref 135–145)
Total Bilirubin: 0.5 mg/dL (ref 0.3–1.2)
Total Protein: 7.7 g/dL (ref 6.5–8.1)

## 2022-10-22 LAB — CBG MONITORING, ED: Glucose-Capillary: 84 mg/dL (ref 70–99)

## 2022-10-22 LAB — PREGNANCY, URINE: Preg Test, Ur: NEGATIVE

## 2022-10-22 MED ORDER — SODIUM CHLORIDE 0.9 % IV SOLN: INTRAVENOUS | Status: DC

## 2022-10-22 MED ORDER — SODIUM CHLORIDE 0.9 % IV BOLUS
1000.0000 mL | Freq: Once | INTRAVENOUS | Status: AC
  Administered 2022-10-22: 1000 mL via INTRAVENOUS

## 2022-10-22 NOTE — Discharge Instructions (Addendum)
You have been seen today for your complaint of lightheadedness. Your lab work was reassuring and showed no abnormalities. Home care instructions are as follows:  Drink plenty of water.  Eat a normal diet Follow up with: Your primary care provider in 1 week for reevaluation Please seek immediate medical care if you develop any of the following symptoms: You faint. You have any of these symptoms that may indicate trouble with your heart: Fast or irregular heartbeats (palpitations). Unusual pain in your chest, abdomen, or back. Shortness of breath. You have a seizure. You have a severe headache. You are confused. You have vision problems. You have severe weakness or trouble walking. You are bleeding from your mouth or rectum, or have black or tarry stool. At this time there does not appear to be the presence of an emergent medical condition, however there is always the potential for conditions to change. Please read and follow the below instructions.  Do not take your medicine if  develop an itchy rash, swelling in your mouth or lips, or difficulty breathing; call 911 and seek immediate emergency medical attention if this occurs.  You may review your lab tests and imaging results in their entirety on your MyChart account.  Please discuss all results of fully with your primary care provider and other specialist at your follow-up visit.  Note: Portions of this text may have been transcribed using voice recognition software. Every effort was made to ensure accuracy; however, inadvertent computerized transcription errors may still be present.

## 2022-10-22 NOTE — ED Triage Notes (Signed)
Pt reports she had a syncopal episode yesterday. Pt stating she was standing and became sweaty and fell to the bed. Denies hitting her head. Pt also reporting palpitations prior to fainting. At this time pt reports lightheadedness.

## 2022-10-22 NOTE — ED Provider Notes (Signed)
[facility] Provider Note   CSN: [id number] Arrival date & time: 10/22/22  1106     History  Chief Complaint  Patient presents with   Loss of Consciousness    Angela Burton is a 52 y.o. female.  With a history of hypertension, asthma, diabetes, hyperthyroidism who presents to the ED for evaluation of near syncope.  She states that she worked 12 hours yesterday, came home and made dinner and then was standing in the bedroom when she started to feel dizzy and lightheaded.  She walked a few steps to call for her fianc and states that she had a brief loss of consciousness.  Her fianc was able to help her into bed where she laid down for few minutes and symptoms resolved.  She then got up to go to work today and states she had a similar episode.  She did not fall or hit her head during either time.  She reports symptoms of feeling flushed and having palpitations prior to near syncopal episodes.  She states the symptoms have happened at least 3 times in the past.  She has never been seen for the symptoms before.  She does take HCTZ and losartan.  She was started on losartan 2 months ago and states she has had intermittent episodes of dizziness since starting the medication.  She has not been drinking as much water as she normally does lately.  She states that she currently has some minimal lightheadedness but denies all other symptoms while at rest.  She denies hemoptysis, history of DVT or PE, unilateral leg swelling, estrogen or progesterone use, recent surgery, travel or cancer treatments.   Loss of Consciousness Associated symptoms: dizziness        Home Medications Prior to Admission medications   Medication Sig Start Date End Date Taking? Authorizing Provider  albuterol (PROVENTIL) (2.5 MG/3ML) 0.083% nebulizer solution USE 1 VIAL VIA NEBULIZER EVERY 6 HOURS AS NEEDED FOR WHEEZING OR SHORTNESS OF BREATH 06/21/22   Elsie Stain, MD   albuterol (VENTOLIN HFA) 108 (90 Base) MCG/ACT inhaler Inhale 1-2 puffs into the lungs every 6 (six) hours as needed for wheezing or shortness of breath. 06/29/22   Argentina Donovan, PA-C  cetirizine (ZYRTEC) 10 MG tablet Take 1 tablet (10 mg total) by mouth daily. 06/29/22   Argentina Donovan, PA-C  fluticasone-salmeterol (ADVAIR DISKUS) 100-50 MCG/ACT AEPB Inhale 1 puff into the lungs 2 (two) times daily. 06/29/22   Argentina Donovan, PA-C  hydrochlorothiazide (HYDRODIURIL) 25 MG tablet Take 1 tablet (25 mg total) by mouth daily. Must have office visit for refills. 06/29/22   Argentina Donovan, PA-C  ibuprofen (ADVIL) 600 MG tablet Take 1 tablet (600 mg total) by mouth every 6 (six) hours as needed for mild pain or moderate pain. 06/02/22   [name], MD  omeprazole (PRILOSEC) 40 MG capsule Take 1 capsule (40 mg total) by mouth daily. 07/18/22   Charlott Rakes, MD  oxyCODONE (OXY IR/ROXICODONE) 5 MG immediate release tablet Take 1-2 tablets (5-10 mg total) by mouth every 6 (six) hours as needed for moderate pain. 06/02/22   [name], MD  ranitidine (ZANTAC 75) 75 MG tablet Take 1 tablet (75 mg total) by mouth 2 (two) times daily. 06/29/22   Argentina Donovan, PA-C  valsartan (DIOVAN) 40 MG tablet Take 1 tablet (40 mg total) by mouth daily. 08/04/22   Charlott Rakes, MD      Allergies  Sulfa antibiotics, Aspirin, and Erythromycin    Review of Systems   Review of Systems  Cardiovascular:  Positive for syncope.  Neurological:  Positive for dizziness and light-headedness.  All other systems reviewed and are negative.   Physical Exam Updated Vital Signs BP 119/73   Pulse 66   Temp 98.1 F (36.7 C) (Oral)   Resp 16   LMP  (LMP Unknown)   SpO2 100%  Physical Exam Vitals and nursing note reviewed.  Constitutional:      General: She is not in acute distress.    Appearance: She is well-developed. She is obese. She is not ill-appearing, toxic-appearing or diaphoretic.      Comments: Resting comfortably in bed  HENT:     Head: Normocephalic and atraumatic.  Eyes:     Conjunctiva/sclera: Conjunctivae normal.  Cardiovascular:     Rate and Rhythm: Normal rate and regular rhythm.     Heart sounds: No murmur heard. Pulmonary:     Effort: Pulmonary effort is normal. No respiratory distress.     Breath sounds: Normal breath sounds. No stridor. No wheezing, rhonchi or rales.  Abdominal:     Palpations: Abdomen is soft.     Tenderness: There is no abdominal tenderness.  Musculoskeletal:        General: No swelling.     Cervical back: Neck supple.     Right lower leg: No edema.     Left lower leg: No edema.  Skin:    General: Skin is warm and dry.     Capillary Refill: Capillary refill takes less than 2 seconds.  Neurological:     Mental Status: She is alert.  Psychiatric:        Mood and Affect: Mood normal.     ED Results / Procedures / Treatments   Labs (all labs ordered are listed, but only abnormal results are displayed) Labs Reviewed  CBC WITH DIFFERENTIAL/PLATELET - Abnormal; Notable for the following components:      Result Value   RBC 5.14 (*)    MCV 76.7 (*)    MCH 23.7 (*)    RDW 19.5 (*)    All other components within normal limits  COMPREHENSIVE METABOLIC PANEL - Abnormal; Notable for the following components:   AST 11 (*)    All other components within normal limits  PREGNANCY, URINE  URINALYSIS, ROUTINE W REFLEX MICROSCOPIC  CBG MONITORING, ED    EKG EKG Interpretation  Date/Time:  Saturday October 22 2022 11:21:03 EST Ventricular Rate:  55 PR Interval:  161 QRS Duration: 91 QT Interval:  467 QTC Calculation: 447 R Axis:   20 Text Interpretation: Sinus rhythm Baseline wander in lead(s) V4 V5 V6 Confirmed by Regan Lemming ([id number]) on 10/22/2022 12:19:31 PM  Radiology No results found.  Procedures Procedures    Medications Ordered in ED Medications  sodium chloride 0.9 % bolus 1,000 mL (0 mLs Intravenous Stopped 10/22/22  1251)    And  0.9 %  sodium chloride infusion ( Intravenous New Bag/Given 10/22/22 1254)    ED Course/ Medical Decision Making/ A&P                       [facility] Syncope Rule Score: 0      Medical Decision Making Amount and/or Complexity of Data Reviewed Labs: ordered.  Risk Prescription drug management.  This patient presents to the ED for concern of near syncope, this involves an extensive number of treatment options, and is  a complaint that carries with it a high risk of complications and morbidity.  The differential for syncope is extensive and includes, but is not limited to: arrythmia (Vtach, SVT, SSS, sinus arrest, AV block, bradycardia) aortic stenosis, AMI, HOCM, PE, atrial myxoma, pulmonary hypertension, orthostatic hypotension, (hypovolemia, drug effect, GB syndrome, micturition, cough, swall) carotid sinus sensitivity, Seizure, TIA/CVA, hypoglycemia,  Vertigo.   Co morbidities that complicate the patient evaluation  hypertension, asthma, diabetes, hypothyroidism  My initial workup includes labs, EKG, fluid resuscitation  Additional history obtained from: Nursing notes from this visit.  I ordered, reviewed and interpreted labs which include: CBC, CMP, urinalysis.  Labs reassuring.  No abnormalities  Afebrile, hemodynamically stable.  52 year old female presenting to the ED for evaluation of near syncope.  On exam she appears overall very well.  She was treated with IV fluids and reported some improvement in her symptoms, however does not typically have symptoms while at rest.  Orthostatic vitals were unremarkable.  Lab workup reassuring.  [place] syncope rule score of 0.  Low suspicion for acute abnormalities as a cause of her symptoms.  More likely vagal episodes.  Patient was requesting discharge home.  I believe this is appropriate.  She has a primary care provider and will schedule an appointment in the next week.  She was given return precautions.  Stable at  discharge.  At this time there does not appear to be any evidence of an acute emergency medical condition and the patient appears stable for discharge with appropriate outpatient follow up. Diagnosis was discussed with patient who verbalizes understanding of care plan and is agreeable to discharge. I have discussed return precautions with patient who verbalizes understanding. Patient encouraged to follow-up with their PCP within 1 week. All questions answered.  Patient's case discussed with Dr. Armandina Gemma who agrees with plan to discharge with follow-up.   Note: Portions of this report may have been transcribed using voice recognition software. Every effort was made to ensure accuracy; however, inadvertent computerized transcription errors may still be present.        Final Clinical Impression(s) / ED Diagnoses Final diagnoses:  Near syncope    Rx / DC Orders ED Discharge Orders     None         Roylene Reason, Hershal Coria 10/22/22 1453    Regan Lemming, MD 10/23/22 [phone]

## 2022-10-31 ENCOUNTER — Other Ambulatory Visit: Payer: Self-pay

## 2022-10-31 NOTE — Progress Notes (Unsigned)
   Established Patient Office Visit  Subjective   Patient ID: Angela Burton, female    DOB: 1970/09/06  Age: 52 y.o. MRN: [id number]  No chief complaint on file.   Not seen by Angela Burton since 2022 Angela Burton saw 06/2022 Angela Burton saw 07/2022 Hypertension longstanding currently above goal on current medications. BP goal < 130/80 mmHg. Medication adherence appears appropriate, however, she had to stop amlodipine d/t HA. Will stop this and try valsartan instead.  -Continued HCTZ 25 mg daily. -Stop amlodipine d/t HA. -Start valsartan 40 mg daily.    -Patient educated on purpose, proper use, and potential adverse effects of valsartan.  -F/u labs ordered - labs per Angela Burton -Counseled on lifestyle modifications for blood pressure control including reduced dietary sodium, increased exercise, adequate sleep. -Encouraged patient to check BP at home and bring log of readings to next visit. Counseled on proper use of home BP cuff.      {History (Optional):23778}  ROS    Objective:     LMP  (LMP Unknown)  {Vitals History (Optional):23777}  Physical Exam   No results found for any visits on 11/01/22.  {Labs (Optional):23779}  The 10-year ASCVD risk score (Arnett DK, et al., 2019) is: 5.2%    Assessment & Plan:   Problem List Items Addressed This Visit   None   No follow-ups on file.    Angela Noble, MD

## 2022-11-01 ENCOUNTER — Ambulatory Visit: Payer: Commercial Managed Care - HMO | Attending: Critical Care Medicine | Admitting: Critical Care Medicine

## 2022-11-01 ENCOUNTER — Encounter: Payer: Self-pay | Admitting: Critical Care Medicine

## 2022-11-01 ENCOUNTER — Other Ambulatory Visit: Payer: Self-pay

## 2022-11-01 VITALS — BP 130/85 | HR 62 | Temp 98.2°F | Ht 66.0 in | Wt 244.2 lb

## 2022-11-01 DIAGNOSIS — N926 Irregular menstruation, unspecified: Secondary | ICD-10-CM

## 2022-11-01 DIAGNOSIS — R718 Other abnormality of red blood cells: Secondary | ICD-10-CM | POA: Diagnosis not present

## 2022-11-01 DIAGNOSIS — I10 Essential (primary) hypertension: Secondary | ICD-10-CM

## 2022-11-01 DIAGNOSIS — Z87891 Personal history of nicotine dependence: Secondary | ICD-10-CM

## 2022-11-01 MED ORDER — VALSARTAN 40 MG PO TABS
40.0000 mg | ORAL_TABLET | Freq: Every day | ORAL | 1 refills | Status: DC
  Filled 2022-11-09: qty 30, 30d supply, fill #0
  Filled 2022-12-20: qty 30, 30d supply, fill #1
  Filled 2023-01-25: qty 30, 30d supply, fill #2
  Filled 2023-02-26 – 2023-02-27 (×2): qty 30, 30d supply, fill #3
  Filled 2023-04-09 – 2023-04-10 (×2): qty 30, 30d supply, fill #4
  Filled 2023-05-10 (×2): qty 30, 30d supply, fill #5

## 2022-11-01 MED ORDER — OMEPRAZOLE 40 MG PO CPDR
40.0000 mg | DELAYED_RELEASE_CAPSULE | Freq: Every day | ORAL | 1 refills | Status: DC
  Filled 2022-11-01 – 2022-11-09 (×2): qty 30, 30d supply, fill #0
  Filled 2022-12-20: qty 30, 30d supply, fill #1

## 2022-11-01 MED ORDER — HYDROCHLOROTHIAZIDE 25 MG PO TABS
25.0000 mg | ORAL_TABLET | Freq: Every day | ORAL | 1 refills | Status: DC
  Filled 2022-11-09: qty 30, 30d supply, fill #0
  Filled 2022-12-20: qty 30, 30d supply, fill #1
  Filled 2023-01-25: qty 30, 30d supply, fill #2
  Filled 2023-02-26 – 2023-02-27 (×2): qty 30, 30d supply, fill #3
  Filled 2023-04-09 – 2023-04-10 (×2): qty 30, 30d supply, fill #4
  Filled 2023-05-10 (×2): qty 30, 30d supply, fill #5

## 2022-11-01 NOTE — Assessment & Plan Note (Signed)
Patient is no longer smoking since November °

## 2022-11-01 NOTE — Progress Notes (Signed)
Seen in ED recently

## 2022-11-01 NOTE — Assessment & Plan Note (Signed)
Assess for iron deficiency anemia

## 2022-11-01 NOTE — Assessment & Plan Note (Signed)
Hypertension above goal  Plan is to discontinue lisinopril and begin valsartan and 40 mg daily and continue hydrochlorothiazide

## 2022-11-01 NOTE — Assessment & Plan Note (Signed)
Resolved with hysterectomy  Plan to check iron levels

## 2022-11-01 NOTE — Patient Instructions (Addendum)
Iron levels will be checked in the lab  Follow a healthy diet as outlined below and walk 30 minutes 2-3 times a week  No change in medications refill sent to pharmacy  Colonoscopy referral will be made  Return to Dr. [name] 2 months         Advice for Weight Management   -For most of [place] the best way to lose weight is by diet management. Generally speaking, diet management means consuming less calories intentionally which over time brings about progressive weight loss.  This can be achieved more effectively by avoiding ultra processed carbohydrates, processed meats, unhealthy fats.    It is critically important to know your numbers: how much calorie you are consuming and how much calorie you need. More importantly, our carbohydrates sources should be unprocessed naturally occurring  complex starch food items.  It is always important to balance nutrition also by  appropriate intake of proteins (mainly plant-based), healthy fats/oils, plenty of fruits and vegetables.    -The American College of Lifestyle Medicine (ACL M) recommends nutrition derived mostly from Whole Food, Plant Predominant Sources example an apple instead of applesauce or apple pie. Eat Plenty of vegetables, Mushrooms, fruits, Legumes, Whole Grains, Nuts, seeds in lieu of processed meats, processed snacks/pastries red meat, poultry, eggs.  Use only water or unsweetened tea for hydration.  The College also recommends the need to stay away from risky substances including alcohol, smoking; obtaining 7-9 hours of restorative sleep, at least 150 minutes of moderate intensity exercise weekly, importance of healthy social connections, and being mindful of stress and seek help when it is overwhelming.     -Sticking to a routine mealtime to eat 3 meals a day and avoiding unnecessary snacks is shown to have a big role in weight control. Under normal circumstances, the only time we burn stored energy is when we are hungry, so allow  some hunger  to take place- hunger means no food between appropriate meal times, only water.  It is not advisable to starve.    -It is better to avoid simple carbohydrates including: Cakes, Sweet Desserts, Ice Cream, Soda (diet and regular), Sweet Tea, Candies, Chips, Cookies, Store Bought Juices, Alcohol in Excess of  1-2 drinks a day, Lemonade,  Artificial Sweeteners, Doughnuts, Coffee Creamers, "Sugar-free" Products, etc, etc.  This is not a complete list...Marland Kitchen.    -Consulting with certified diabetes educators is proven to provide you with the most accurate and current information on diet.  Also, you may be  interested in discussing diet options/exchanges , we can schedule a visit with Jearld Fenton, RDN, CDE for individualized nutrition education.   -Exercise: If you are able: 30 -60 minutes a day ,4 days a week, or 150 minutes of moderate intensity exercise weekly.    The longer the better if tolerated.  Combine stretch, strength, and aerobic activities.  If you were told in the past that you have high risk for cardiovascular diseases, or if you are currently symptomatic, you may seek evaluation by your heart doctor prior to initiating moderate to intense exercise programs.                                    Additional Care Considerations for Diabetes/Prediabetes     -Diabetes  is a chronic disease.  The most important care consideration is regular follow-up with your diabetes care provider with the goal being avoiding or delaying  its complications and to take advantage of advances in medications and technology.  If appropriate actions are taken early enough, type 2 diabetes can even be reversed.  Seek information from the right source.   - Whole Food, Plant Predominant Nutrition is highly recommended: Eat Plenty of vegetables, Mushrooms, fruits, Legumes, Whole Grains, Nuts, seeds in lieu of processed meats, processed snacks/pastries red meat, poultry, eggs as recommended by [employer] of  Lifestyle  Medicine (ACLM).   -Type 2 diabetes is known to coexist with other important comorbidities such as high blood pressure and high cholesterol.  It is critical to control not only the diabetes but also the high blood pressure and high cholesterol to minimize and delay the risk of complications including coronary artery disease, stroke, amputations, blindness, etc.  The good news is that this diet recommendation for type 2 diabetes is also very helpful for managing high cholesterol and high blood blood pressure.   - Studies showed that people with diabetes will benefit from a class of medications known as ACE inhibitors and statins.  Unless there are specific reasons not to be on these medications, the standard of care is to consider getting one from these groups of medications at an optimal doses.  These medications are generally considered safe and proven to help protect the heart and the kidneys.     - People with diabetes are encouraged to initiate and maintain regular follow-up with eye doctors, foot doctors, dentists , and if necessary heart and kidney doctors.      - It is highly recommended that people with diabetes quit smoking or stay away from smoking, and get yearly  flu vaccine and pneumonia vaccine at least every 5 years.  See above for additional recommendations on exercise, sleep, stress management , and healthy social connections.

## 2022-11-07 ENCOUNTER — Other Ambulatory Visit: Payer: Self-pay

## 2022-11-09 ENCOUNTER — Other Ambulatory Visit: Payer: Self-pay

## 2022-11-10 ENCOUNTER — Other Ambulatory Visit: Payer: Self-pay

## 2022-11-16 ENCOUNTER — Other Ambulatory Visit: Payer: Self-pay

## 2022-11-16 ENCOUNTER — Ambulatory Visit: Payer: Self-pay | Admitting: *Deleted

## 2022-11-16 ENCOUNTER — Telehealth: Payer: Commercial Managed Care - HMO | Admitting: Physician Assistant

## 2022-11-16 DIAGNOSIS — J039 Acute tonsillitis, unspecified: Secondary | ICD-10-CM | POA: Diagnosis not present

## 2022-11-16 MED ORDER — AMOXICILLIN 500 MG PO CAPS
500.0000 mg | ORAL_CAPSULE | Freq: Two times a day (BID) | ORAL | 0 refills | Status: AC
  Filled 2022-11-16: qty 20, 10d supply, fill #0

## 2022-11-16 MED ORDER — PREDNISONE 20 MG PO TABS
40.0000 mg | ORAL_TABLET | Freq: Every day | ORAL | 0 refills | Status: DC
  Filled 2022-11-16: qty 6, 3d supply, fill #0

## 2022-11-16 NOTE — Telephone Encounter (Signed)
Call pt to schedule virtual visit  unable to reach message  left on VM

## 2022-11-16 NOTE — Patient Instructions (Signed)
  Angela Burton, thank you for joining Angela Rio, PA-C for today's virtual visit.  While this provider is not your primary care provider (PCP), if your PCP is located in our provider database this encounter information will be shared with them immediately following your visit.   Angela Burton account gives you access to today's visit and all your visits, tests, and labs performed at [facility] " click here if you don't have a [facility] account or go to [email or web address]  Consent: (Patient) Angela Burton provided verbal consent for this virtual visit at the beginning of the encounter.  Current Medications:  Current Outpatient Medications:    albuterol (PROVENTIL) (2.5 MG/3ML) 0.083% nebulizer solution, USE 1 VIAL VIA NEBULIZER EVERY 6 HOURS AS NEEDED FOR WHEEZING OR SHORTNESS OF BREATH, Disp: 180 mL, Rfl: 0   albuterol (VENTOLIN HFA) 108 (90 Base) MCG/ACT inhaler, Inhale 1-2 puffs into the lungs every 6 (six) hours as needed for wheezing or shortness of breath., Disp: 6.7 g, Rfl: 0   cetirizine (ZYRTEC) 10 MG tablet, Take 1 tablet (10 mg total) by mouth daily., Disp: 30 tablet, Rfl: 11   hydrochlorothiazide (HYDRODIURIL) 25 MG tablet, Take 1 tablet (25 mg total) by mouth daily. Must have office visit for refills., Disp: 90 tablet, Rfl: 1   omeprazole (PRILOSEC) 40 MG capsule, Take 1 capsule (40 mg total) by mouth daily., Disp: 30 capsule, Rfl: 1   valsartan (DIOVAN) 40 MG tablet, Take 1 tablet (40 mg total) by mouth daily., Disp: 90 tablet, Rfl: 1   Medications ordered in this encounter:  No orders of the defined types were placed in this encounter.    *If you need refills on other medications prior to your next appointment, please contact your pharmacy*  Follow-Up: Call back or seek an in-person evaluation if the symptoms worsen or if the condition fails to improve as anticipated.  Angela Burton [id number]  Other  Instructions Please keep well-hydrated and get plenty of rest. You can alternate over-the-counter Tylenol and ibuprofen as needed. Salt water gargles and Chloraseptic spray may also be beneficial. If you have a humidifier, running in the bedroom at night. Take the antibiotic as directed with food. The prednisone is to help with swelling. If symptoms or not resolving or you note any new or worsening symptoms despite treatment, please seek an in person evaluation ASAP.  Do not delay care!   If you have been instructed to have an in-person evaluation today at a local Urgent Care facility, please use the link below. It will take you to a list of all of our available [facility], including address, phone number and hours of operation. Please do not delay care.  [facility]  If you or a family member do not have a primary care provider, use the link below to schedule a visit and establish care. When you choose a [facility] primary care physician or advanced practice provider, you gain a long-term partner in health. Find a Primary Care Provider  Learn more about [facility] in-office and virtual care options: [facility]

## 2022-11-16 NOTE — Telephone Encounter (Signed)
Reason for Disposition  [1] Pus on tonsils (back of throat) AND [2]  fever AND [3] swollen neck lymph nodes ("glands")  Answer Assessment - Initial Assessment Questions 1. ONSET: "When did the throat start hurting?" (Hours or days ago)      I have a sore throat.   Started on Monday.   2. SEVERITY: "How bad is the sore throat?" (Scale 1-10; mild, moderate or severe)   - MILD (1-3):  Doesn't interfere with eating or normal activities.   - MODERATE (4-7): Interferes with eating some solids and normal activities.   - SEVERE (8-10):  Excruciating pain, interferes with most normal activities.   - SEVERE WITH DYSPHAGIA (10): Can't swallow liquids, drooling.     White in the back of my throat. I've had a fever, chills real bad, body aches.   I've taken Theraflu, chloraseptic and nothing is helping.   Gargled with warm salt water.    My boss had strep throat twice recently.   I've exposed to her. 3. STREP EXPOSURE: "Has there been any exposure to strep within the past week?" If Yes, ask: "What type of contact occurred?"      Yes my boss 4.  VIRAL SYMPTOMS: "Are there any symptoms of a cold, such as a runny nose, cough, hoarse voice or red eyes?"      See above.    Don't have a runny nose 5. FEVER: "Do you have a fever?" If Yes, ask: "What is your temperature, how was it measured, and when did it start?"     Yes 6. PUS ON THE TONSILS: "Is there pus on the tonsils in the back of your throat?"     Yes white 7. OTHER SYMPTOMS: "Do you have any other symptoms?" (e.g., difficulty breathing, headache, rash)     I'm coughing up yellow mucus.   I have a terrible headache.   Fever 100.9 was highest. 8. PREGNANCY: "Is there any chance you are pregnant?" "When was your last menstrual period?"     Not asked  Protocols used: Sore Throat-A-AH

## 2022-11-16 NOTE — Progress Notes (Signed)
Virtual Visit Consent   Angela Burton, you are scheduled for a virtual visit with a [facility] provider today. Just as with appointments in the office, your consent must be obtained to participate. Your consent will be active for this visit and any virtual visit you may have with one of our providers in the next 365 days. If you have a MyChart account, a copy of this consent can be sent to you electronically.  As this is a virtual visit, video technology does not allow for your provider to perform a traditional examination. This may limit your provider's ability to fully assess your condition. If your provider identifies any concerns that need to be evaluated in person or the need to arrange testing (such as labs, EKG, etc.), we will make arrangements to do so. Although advances in technology are sophisticated, we cannot ensure that it will always work on either your end or our end. If the connection with a video visit is poor, the visit may have to be switched to a telephone visit. With either a video or telephone visit, we are not always able to ensure that we have a secure connection.  By engaging in this virtual visit, you consent to the provision of healthcare and authorize for your insurance to be billed (if applicable) for the services provided during this visit. Depending on your insurance coverage, you may receive a charge related to this service.  I need to obtain your verbal consent now. Are you willing to proceed with your visit today? [name] has provided verbal consent on 11/16/2022 for a virtual visit (video or telephone). [name], [place]  Date: 11/16/2022 9:09 AM  Virtual Visit via Video Note   I, [name], connected with  Angela Burton  ([id number], 1970-11-27) on 11/16/22 at  9:00 AM EDT by a video-enabled telemedicine application and verified that I am speaking with the correct person using two identifiers.  Location: Patient: Virtual Visit Location  Patient: Home Provider: Virtual Visit Location Provider: Home Office   I discussed the limitations of evaluation and management by telemedicine and the availability of in person appointments. The patient expressed understanding and agreed to proceed.    History of Present Illness: Angela Burton is a 52 y.o. who identifies as a female who was assigned female at birth, and is being seen today for 2 days of body aches, sore throat, chills, white patches on her tonsils. Tmax 100.9. Some nasal congestion and cough but not significant. Known close exposure to strep throat.    OTC -- Robitussin, Nyquil, Dayquil.   HPI: HPI  Problems:  Patient Active Problem List   Diagnosis Date Noted   Low mean corpuscular volume (MCV) 11/01/2022   Uterine fibroid 05/31/2022   S/P vaginal hysterectomy 05/31/2022   Periodontal disease 08/03/2021   Vision changes 08/03/2021   Prediabetes 08/03/2021   Obesity (BMI 35.0-39.9 without comorbidity) 04/22/2020   Hypertension 03/24/2020   Right ovarian cyst 03/24/2020   Presence of copper intrauterine contraceptive device 03/24/2020   Smoking history 03/24/2020   Snoring 04/05/2016   Asthma 03/04/2016    Allergies:  Allergies  Allergen Reactions   Sulfa Antibiotics Anaphylaxis, Hives, Swelling and Rash   Aspirin Hives    Able to take ibuprofen   Erythromycin Other (See Comments)    Had a seizure.    Medications:  Current Outpatient Medications:    amoxicillin (AMOXIL) 500 MG tablet, Take 1 tablet (500 mg total) by mouth 2 (two) times daily for  10 days., Disp: 20 tablet, Rfl: 0   predniSONE (DELTASONE) 20 MG tablet, Take 2 tablets (40 mg total) by mouth daily with breakfast., Disp: 6 tablet, Rfl: 0   albuterol (PROVENTIL) (2.5 MG/3ML) 0.083% nebulizer solution, USE 1 VIAL VIA NEBULIZER EVERY 6 HOURS AS NEEDED FOR WHEEZING OR SHORTNESS OF BREATH, Disp: 180 mL, Rfl: 0   albuterol (VENTOLIN HFA) 108 (90 Base) MCG/ACT inhaler, Inhale 1-2 puffs into the lungs  every 6 (six) hours as needed for wheezing or shortness of breath., Disp: 6.7 g, Rfl: 0   cetirizine (ZYRTEC) 10 MG tablet, Take 1 tablet (10 mg total) by mouth daily., Disp: 30 tablet, Rfl: 11   hydrochlorothiazide (HYDRODIURIL) 25 MG tablet, Take 1 tablet (25 mg total) by mouth daily. Must have office visit for refills., Disp: 90 tablet, Rfl: 1   omeprazole (PRILOSEC) 40 MG capsule, Take 1 capsule (40 mg total) by mouth daily., Disp: 30 capsule, Rfl: 1   valsartan (DIOVAN) 40 MG tablet, Take 1 tablet (40 mg total) by mouth daily., Disp: 90 tablet, Rfl: 1  Observations/Objective: Patient is well-developed, well-nourished in no acute distress.  Resting comfortably at home.  Head is normocephalic, atraumatic.  No labored breathing.  Speech is clear and coherent with logical content.  Patient is alert and oriented at baseline.  Bilateral tonsillar swelling with noted exudate.  Posterior oropharynx with substantial erythema.  Uvula is midline and without lesion or edema.   Assessment and Plan: 1. Exudative tonsillitis - amoxicillin (AMOXIL) 500 MG tablet; Take 1 tablet (500 mg total) by mouth 2 (two) times daily for 10 days.  Dispense: 20 tablet; Refill: 0 - predniSONE (DELTASONE) 20 MG tablet; Take 2 tablets (40 mg total) by mouth daily with breakfast.  Dispense: 6 tablet; Refill: 0  Known strep exposure.  Classic symptoms.  Will start amoxicillin 500 mg twice daily x 10 days.  Supportive measures and OTC medications reviewed.  Giving pain with swallowing and oropharyngeal/tonsillar swelling, will give short course of prednisone.  Strict in person follow-up precautions discussed.  Follow Up Instructions: I discussed the assessment and treatment plan with the patient. The patient was provided an opportunity to ask questions and all were answered. The patient agreed with the plan and demonstrated an understanding of the instructions.  A copy of instructions were sent to the patient via MyChart  unless otherwise noted below.   The patient was advised to call back or seek an in-person evaluation if the symptoms worsen or if the condition fails to improve as anticipated.  Time:  I spent 10 minutes with the patient via telehealth technology discussing the above problems/concerns.    [name], PA-C

## 2022-11-16 NOTE — Telephone Encounter (Signed)
  Chief Complaint: Strep throat  Exposed to boss who had it Symptoms: Body aches, chills real bad, fever, white places in back of her throat, coughing up a little yellow mucus Frequency: Started Monday Pertinent Negatives: Patient denies chest tightness or pain Disposition: [] ED /[] Urgent Care (no appt availability in office) / [] Appointment(In office/virtual)/ [x]  [facility]/ [] Home Care/ [] Refused Recommended Disposition /[]  Mobile Bus/ []  Follow-up with PCP Additional Notes: No appts available at [facility] with any of the providers.    Pt. Is calling the MyChart Help Desk to get set back up on her MyChart.   She forgot her username and password so I wasn't able to assist her with getting signed up for an appt.    I let her know to call us back if she had a problem or the virtual visit didn't work out.    She was agreeable to this plan.

## 2022-11-16 NOTE — Telephone Encounter (Signed)
Patient returned call patient was seen via mychart visit on today.

## 2022-12-21 ENCOUNTER — Other Ambulatory Visit: Payer: Self-pay

## 2022-12-26 ENCOUNTER — Other Ambulatory Visit: Payer: Self-pay

## 2023-01-09 NOTE — Progress Notes (Unsigned)
Established Patient Office Visit  Subjective   Patient ID: Angela Burton, female    DOB: 1971/08/18  Age: 52 y.o. MRN: [id number]  No chief complaint on file.   Not seen by Delford Field since 2022 [name] saw 06/2022 Franky Macho saw 07/2022 Hypertension longstanding currently above goal on current medications. BP goal < 130/80 mmHg. Medication adherence appears appropriate, however, she had to stop amlodipine d/t HA. Will stop this and try valsartan instead.  -Continued HCTZ 25 mg daily. -Stop amlodipine d/t HA. -Start valsartan 40 mg daily.    -Patient educated on purpose, proper use, and potential adverse effects of valsartan.  -F/u labs ordered - labs per Marylene Land -Counseled on lifestyle modifications for blood pressure control including reduced dietary sodium, increased exercise, adequate sleep. -Encouraged patient to check BP at home and bring log of readings to next visit. Counseled on proper use of home BP cuff.   On arrival today patient has lost some weight does have home blood pressure readings at home blood pressure runs in the 128/81 range today on arrival she is 130/85.  She does complain of some dizziness lightheadedness and presyncope for which she is on to the emergency room.  She states is related to panic attacks.  Does have a history of iron deficiency anemia and had menorrhagia but did finally have a hysterectomy in October 23.  Patient does need colonoscopy.  She is not on iron supplements.  01/10/23 HTN f/u      Review of Systems  Constitutional:  Negative for chills, diaphoresis, fever, malaise/fatigue and weight loss.  HENT:  Negative for congestion, ear discharge, ear pain, hearing loss, nosebleeds, sore throat and tinnitus.   Eyes:  Negative for blurred vision, double vision, photophobia and discharge.  Respiratory:  Negative for cough, hemoptysis, sputum production, shortness of breath, wheezing and stridor.        No excess mucus  Cardiovascular:  Negative for chest  pain, palpitations, orthopnea, claudication, leg swelling and PND.  Gastrointestinal:  Negative for abdominal pain, blood in stool, constipation, diarrhea, heartburn, melena, nausea and vomiting.  Genitourinary:  Negative for dysuria, flank pain, frequency, hematuria and urgency.  Musculoskeletal:  Negative for back pain, falls, joint pain, myalgias and neck pain.  Skin:  Negative for itching and rash.  Neurological:  Positive for dizziness and loss of consciousness. Negative for tingling, tremors, sensory change, speech change, focal weakness, seizures, weakness and headaches.  Endo/Heme/Allergies:  Negative for environmental allergies and polydipsia. Does not bruise/bleed easily.  Psychiatric/Behavioral:  Negative for depression, hallucinations, memory loss, substance abuse and suicidal ideas. The patient is not nervous/anxious and does not have insomnia.   All other systems reviewed and are negative.     Objective:     LMP  (LMP Unknown)    Physical Exam Vitals reviewed.  Constitutional:      Appearance: Normal appearance. She is well-developed. She is obese. She is not diaphoretic.  HENT:     Head: Normocephalic and atraumatic.     Nose: No nasal deformity, septal deviation, mucosal edema or rhinorrhea.     Right Sinus: No maxillary sinus tenderness or frontal sinus tenderness.     Left Sinus: No maxillary sinus tenderness or frontal sinus tenderness.     Mouth/Throat:     Pharynx: No oropharyngeal exudate.  Eyes:     General: No scleral icterus.    Conjunctiva/sclera: Conjunctivae normal.     Pupils: Pupils are equal, round, and reactive to light.  Neck:  Thyroid: No thyromegaly.     Vascular: No carotid bruit or JVD.     Trachea: Trachea normal. No tracheal tenderness or tracheal deviation.  Cardiovascular:     Rate and Rhythm: Normal rate and regular rhythm.     Chest Wall: PMI is not displaced.     Pulses: Normal pulses. No decreased pulses.     Heart sounds:  Normal heart sounds, S1 normal and S2 normal. Heart sounds not distant. No murmur heard.    No systolic murmur is present.     No diastolic murmur is present.     No friction rub. No gallop. No S3 or S4 sounds.  Pulmonary:     Effort: No tachypnea, accessory muscle usage or respiratory distress.     Breath sounds: No stridor. No decreased breath sounds, wheezing, rhonchi or rales.  Chest:     Chest wall: No tenderness.  Abdominal:     General: Bowel sounds are normal. There is no distension.     Palpations: Abdomen is soft. Abdomen is not rigid.     Tenderness: There is no abdominal tenderness. There is no guarding or rebound.  Musculoskeletal:        General: Normal range of motion.     Cervical back: Normal range of motion and neck supple. No edema, erythema or rigidity. No muscular tenderness. Normal range of motion.  Lymphadenopathy:     Head:     Right side of head: No submental or submandibular adenopathy.     Left side of head: No submental or submandibular adenopathy.     Cervical: No cervical adenopathy.  Skin:    General: Skin is warm and dry.     Coloration: Skin is not pale.     Findings: No rash.     Nails: There is no clubbing.  Neurological:     Mental Status: She is alert and oriented to person, place, and time.     Sensory: No sensory deficit.  Psychiatric:        Speech: Speech normal.        Behavior: Behavior normal.      No results found for any visits on 01/10/23.    The 10-year ASCVD risk score (Arnett DK, et al., 2019) is: 7.5%    Assessment & Plan:   Problem List Items Addressed This Visit   None Referral for colonoscopy was made  No follow-ups on file.    Shan Levans, MD

## 2023-01-10 ENCOUNTER — Ambulatory Visit: Payer: Self-pay | Attending: Critical Care Medicine | Admitting: Critical Care Medicine

## 2023-01-10 ENCOUNTER — Other Ambulatory Visit: Payer: Self-pay

## 2023-01-10 ENCOUNTER — Encounter: Payer: Self-pay | Admitting: Critical Care Medicine

## 2023-01-10 VITALS — BP 133/78 | HR 66 | Wt 249.6 lb

## 2023-01-10 DIAGNOSIS — Z6841 Body Mass Index (BMI) 40.0 and over, adult: Secondary | ICD-10-CM

## 2023-01-10 DIAGNOSIS — I10 Essential (primary) hypertension: Secondary | ICD-10-CM

## 2023-01-10 MED ORDER — TETANUS-DIPHTH-ACELL PERTUSSIS 5-2-15.5 LF-MCG/0.5 IM SUSP: 0.5000 mL | Freq: Once | INTRAMUSCULAR | 0 refills | Status: DC

## 2023-01-10 MED ORDER — TETANUS-DIPHTH-ACELL PERTUSSIS 5-2-15.5 LF-MCG/0.5 IM SUSP
0.5000 mL | Freq: Once | INTRAMUSCULAR | 0 refills | Status: AC
  Filled 2023-01-10: qty 0.5, 1d supply, fill #0

## 2023-01-10 NOTE — Assessment & Plan Note (Signed)
BMI elevated at 40 Plan for this patient is to follow a lifestyle medicine approach I gave her handout for this  The following Lifestyle Medicine recommendations according to American College of Lifestyle Medicine [facility]) were discussed and offered to patient who agrees to start the journey:  A. Whole Foods, Plant-based plate comprising of fruits and vegetables, plant-based proteins, whole-grain carbohydrates was discussed in detail with the patient.   A list for source of those nutrients were also provided to the patient.  Patient will use only water or unsweetened tea for hydration. B.  The need to stay away from risky substances including alcohol, smoking; obtaining 7 to 9 hours of restorative sleep, at least 150 minutes of moderate intensity exercise weekly, the importance of healthy social connections,  and stress reduction techniques were discussed. C.  A full color page of  Calorie density of various food groups per pound showing examples of each food groups was provided to the patient.

## 2023-01-10 NOTE — Patient Instructions (Addendum)
Tetanus shot will be given go to the pharmacy downstairs  No labs needed today  Work on the lifestyle management approach as below for weight loss  Let us know when you achieve Medicaid we would like to get a home sleep study on you in a colon exam  Blood pressure is improved no change in medications  Return 4 months

## 2023-01-10 NOTE — Assessment & Plan Note (Addendum)
Well-controlled will continue with hydrochlorothiazide and low-dose valsartan

## 2023-01-20 ENCOUNTER — Other Ambulatory Visit: Payer: Self-pay

## 2023-01-25 ENCOUNTER — Other Ambulatory Visit: Payer: Self-pay

## 2023-01-25 ENCOUNTER — Other Ambulatory Visit: Payer: Self-pay | Admitting: Critical Care Medicine

## 2023-01-25 MED ORDER — OMEPRAZOLE 40 MG PO CPDR
40.0000 mg | DELAYED_RELEASE_CAPSULE | Freq: Every day | ORAL | 1 refills | Status: DC
  Filled 2023-01-25: qty 90, 90d supply, fill #0
  Filled 2023-02-26 – 2023-05-10 (×3): qty 90, 90d supply, fill #1

## 2023-01-25 NOTE — Telephone Encounter (Signed)
Requested Prescriptions  Pending Prescriptions Disp Refills   omeprazole (PRILOSEC) 40 MG capsule 90 capsule 1    Sig: Take 1 capsule (40 mg total) by mouth daily.     Gastroenterology: Proton Pump Inhibitors Passed - 01/25/2023  6:20 AM      Passed - Valid encounter within last 12 months    Recent Outpatient Visits           2 weeks ago BMI 40.0-44.9, adult [facility])   [facility] Storm Frisk, MD   2 months ago Primary hypertension   [place] [facility] Storm Frisk, MD   5 months ago Essential hypertension   [facility] [place], Cornelius Moras, RPH-CPP   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

## 2023-01-31 ENCOUNTER — Other Ambulatory Visit: Payer: Self-pay

## 2023-02-07 ENCOUNTER — Other Ambulatory Visit: Payer: Self-pay

## 2023-02-27 ENCOUNTER — Other Ambulatory Visit: Payer: Self-pay

## 2023-03-02 ENCOUNTER — Other Ambulatory Visit: Payer: Self-pay

## 2023-04-10 ENCOUNTER — Other Ambulatory Visit: Payer: Self-pay | Admitting: Physician Assistant

## 2023-04-10 ENCOUNTER — Other Ambulatory Visit: Payer: Self-pay

## 2023-04-10 DIAGNOSIS — J4520 Mild intermittent asthma, uncomplicated: Secondary | ICD-10-CM

## 2023-04-10 MED ORDER — ALBUTEROL SULFATE HFA 108 (90 BASE) MCG/ACT IN AERS
1.0000 | INHALATION_SPRAY | Freq: Four times a day (QID) | RESPIRATORY_TRACT | 1 refills | Status: DC | PRN
  Filled 2023-04-10: qty 6.7, 25d supply, fill #0
  Filled 2023-06-12 (×2): qty 6.7, 25d supply, fill #1

## 2023-05-10 ENCOUNTER — Other Ambulatory Visit: Payer: Self-pay

## 2023-05-15 NOTE — Progress Notes (Unsigned)
Established Patient Office Visit  Subjective   Patient ID: Angela Burton, female    DOB: [birth year]  Age: 52 y.o. MRN: [id number]  No chief complaint on file.   10/2022 Not seen by Delford Field since 2022 [name] saw 06/2022 Franky Macho saw 07/2022 Hypertension longstanding currently above goal on current medications. BP goal < 130/80 mmHg. Medication adherence appears appropriate, however, she had to stop amlodipine d/t HA. Will stop this and try valsartan instead.  -Continued HCTZ 25 mg daily. -Stop amlodipine d/t HA. -Start valsartan 40 mg daily.    -Patient educated on purpose, proper use, and potential adverse effects of valsartan.  -F/u labs ordered - labs per Marylene Land -Counseled on lifestyle modifications for blood pressure control including reduced dietary sodium, increased exercise, adequate sleep. -Encouraged patient to check BP at home and bring log of readings to next visit. Counseled on proper use of home BP cuff.   On arrival today patient has lost some weight does have home blood pressure readings at home blood pressure runs in the 128/81 range today on arrival she is 130/85.  She does complain of some dizziness lightheadedness and presyncope for which she is on to the emergency room.  She states is related to panic attacks.  Does have a history of iron deficiency anemia and had menorrhagia but did finally have a hysterectomy in October 23.  Patient does need colonoscopy.  She is not on iron supplements.  01/10/23 Patient seen for primary care follow-up on arrival blood pressure is 133/78.  The patient complains of progressive weight gain and her BMI is 40.3.  Patient eats erratically and eats a lot of starchy foods occasionally salads.  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  She often only has coffee for breakfast.  She does not eat dinner till 9 PM usually gets baked chicken or fish there are no other complaints     Review of Systems  Constitutional:   Negative for chills, diaphoresis, fever, malaise/fatigue and weight loss.  HENT:  Negative for congestion, ear discharge, ear pain, hearing loss, nosebleeds, sore throat and tinnitus.   Eyes:  Negative for blurred vision, double vision, photophobia and discharge.  Respiratory:  Negative for cough, hemoptysis, sputum production, shortness of breath, wheezing and stridor.        No excess mucus  Cardiovascular:  Negative for chest pain, palpitations, orthopnea, claudication, leg swelling and PND.  Gastrointestinal:  Negative for abdominal pain, blood in stool, constipation, diarrhea, heartburn, melena, nausea and vomiting.  Genitourinary:  Negative for dysuria, flank pain, frequency, hematuria and urgency.  Musculoskeletal:  Negative for back pain, falls, joint pain, myalgias and neck pain.  Skin:  Negative for itching and rash.  Neurological:  Negative for dizziness, tingling, tremors, sensory change, speech change, focal weakness, seizures, loss of consciousness, weakness and headaches.  Endo/Heme/Allergies:  Negative for environmental allergies and polydipsia. Does not bruise/bleed easily.  Psychiatric/Behavioral:  Negative for depression, hallucinations, memory loss, substance abuse and suicidal ideas. The patient is not nervous/anxious and does not have insomnia.   All other systems reviewed and are negative.     Objective:     LMP  (LMP Unknown)    Physical Exam Vitals reviewed.  Constitutional:      Appearance: Normal appearance. She is well-developed. She is obese. She is not diaphoretic.  HENT:     Head: Normocephalic and atraumatic.     Nose: No nasal deformity, septal deviation, mucosal edema or rhinorrhea.  Right Sinus: No maxillary sinus tenderness or frontal sinus tenderness.     Left Sinus: No maxillary sinus tenderness or frontal sinus tenderness.     Mouth/Throat:     Pharynx: No oropharyngeal exudate.  Eyes:     General: No scleral icterus.     Conjunctiva/sclera: Conjunctivae normal.     Pupils: Pupils are equal, round, and reactive to light.  Neck:     Thyroid: No thyromegaly.     Vascular: No carotid bruit or JVD.     Trachea: Trachea normal. No tracheal tenderness or tracheal deviation.  Cardiovascular:     Rate and Rhythm: Normal rate and regular rhythm.     Chest Wall: PMI is not displaced.     Pulses: Normal pulses. No decreased pulses.     Heart sounds: Normal heart sounds, S1 normal and S2 normal. Heart sounds not distant. No murmur heard.    No systolic murmur is present.     No diastolic murmur is present.     No friction rub. No gallop. No S3 or S4 sounds.  Pulmonary:     Effort: No tachypnea, accessory muscle usage or respiratory distress.     Breath sounds: No stridor. No decreased breath sounds, wheezing, rhonchi or rales.  Chest:     Chest wall: No tenderness.  Abdominal:     General: Bowel sounds are normal. There is no distension.     Palpations: Abdomen is soft. Abdomen is not rigid.     Tenderness: There is no abdominal tenderness. There is no guarding or rebound.  Musculoskeletal:        General: Normal range of motion.     Cervical back: Normal range of motion and neck supple. No edema, erythema or rigidity. No muscular tenderness. Normal range of motion.  Lymphadenopathy:     Head:     Right side of head: No submental or submandibular adenopathy.     Left side of head: No submental or submandibular adenopathy.     Cervical: No cervical adenopathy.  Skin:    General: Skin is warm and dry.     Coloration: Skin is not pale.     Findings: No rash.     Nails: There is no clubbing.  Neurological:     Mental Status: She is alert and oriented to person, place, and time.     Sensory: No sensory deficit.  Psychiatric:        Speech: Speech normal.        Behavior: Behavior normal.     No results found for any visits on 05/16/23.    The ASCVD Risk score (Arnett DK, et al., 2019) failed to  calculate for the following reasons:   Cannot find a previous HDL lab   Cannot find a previous total cholesterol lab    Assessment & Plan:   Problem List Items Addressed This Visit   None  Referral for colonoscopy was made The patient will receive a tetanus vaccine No follow-ups on file.    Shan Levans, MD

## 2023-05-16 ENCOUNTER — Ambulatory Visit: Payer: Self-pay | Attending: Critical Care Medicine | Admitting: Critical Care Medicine

## 2023-05-16 ENCOUNTER — Other Ambulatory Visit: Payer: Self-pay

## 2023-05-16 ENCOUNTER — Encounter: Payer: Self-pay | Admitting: Critical Care Medicine

## 2023-05-16 VITALS — BP 130/84 | HR 66 | Wt 249.4 lb

## 2023-05-16 DIAGNOSIS — I10 Essential (primary) hypertension: Secondary | ICD-10-CM

## 2023-05-16 DIAGNOSIS — N63 Unspecified lump in unspecified breast: Secondary | ICD-10-CM | POA: Insufficient documentation

## 2023-05-16 DIAGNOSIS — T801XXD Vascular complications following infusion, transfusion and therapeutic injection, subsequent encounter: Secondary | ICD-10-CM

## 2023-05-16 DIAGNOSIS — Z23 Encounter for immunization: Secondary | ICD-10-CM

## 2023-05-16 MED ORDER — VALSARTAN 40 MG PO TABS
40.0000 mg | ORAL_TABLET | Freq: Every day | ORAL | 2 refills | Status: DC
  Filled 2023-05-16 – 2023-06-12 (×2): qty 90, 90d supply, fill #0
  Filled 2023-08-23 – 2023-09-18 (×3): qty 90, 90d supply, fill #1

## 2023-05-16 MED ORDER — CETIRIZINE HCL 10 MG PO TABS
10.0000 mg | ORAL_TABLET | Freq: Every day | ORAL | 11 refills | Status: DC
  Filled 2023-05-16 – 2023-06-12 (×2): qty 60, 60d supply, fill #0
  Filled 2023-08-23 – 2023-08-24 (×2): qty 60, 60d supply, fill #1
  Filled 2023-09-17 – 2023-10-16 (×2): qty 60, 60d supply, fill #2
  Filled 2023-11-09 – 2024-01-15 (×2): qty 60, 60d supply, fill #3
  Filled 2024-04-11: qty 60, 60d supply, fill #4

## 2023-05-16 MED ORDER — HYDROCHLOROTHIAZIDE 25 MG PO TABS
25.0000 mg | ORAL_TABLET | Freq: Every day | ORAL | 1 refills | Status: DC
  Filled 2023-05-16 – 2023-06-12 (×2): qty 90, 90d supply, fill #0
  Filled 2023-08-23 – 2023-09-18 (×3): qty 90, 90d supply, fill #1

## 2023-05-16 MED ORDER — OMEPRAZOLE 40 MG PO CPDR
40.0000 mg | DELAYED_RELEASE_CAPSULE | Freq: Every day | ORAL | 1 refills | Status: DC
  Filled 2023-05-16 – 2023-08-23 (×2): qty 90, 90d supply, fill #0

## 2023-05-16 NOTE — Assessment & Plan Note (Signed)
Hypertension well-controlled continue with the hydrochlorothiazide and valsartan

## 2023-05-16 NOTE — Assessment & Plan Note (Addendum)
Nodular mass deep in the right breast at the 9 o'clock position will obtain mammogram Note this was ordered previously but never obtained in May we will make sure she gets a scholarship program and the mammogram is processed

## 2023-05-16 NOTE — Patient Instructions (Signed)
Mammogram will be ordered All medications will be refilled  Return 5 months

## 2023-05-22 ENCOUNTER — Other Ambulatory Visit: Payer: Self-pay

## 2023-05-22 ENCOUNTER — Encounter (HOSPITAL_BASED_OUTPATIENT_CLINIC_OR_DEPARTMENT_OTHER): Payer: Self-pay

## 2023-05-22 ENCOUNTER — Emergency Department (HOSPITAL_BASED_OUTPATIENT_CLINIC_OR_DEPARTMENT_OTHER)
Admission: EM | Admit: 2023-05-22 | Discharge: 2023-05-22 | Disposition: A | Discharge status: Home / Self Care | Payer: Self-pay | Attending: Emergency Medicine | Admitting: Emergency Medicine

## 2023-05-22 DIAGNOSIS — W57XXXA Bitten or stung by nonvenomous insect and other nonvenomous arthropods, initial encounter: Secondary | ICD-10-CM | POA: Insufficient documentation

## 2023-05-22 DIAGNOSIS — L03221 Cellulitis of neck: Secondary | ICD-10-CM

## 2023-05-22 MED ORDER — CLINDAMYCIN HCL 150 MG PO CAPS
300.0000 mg | ORAL_CAPSULE | Freq: Once | ORAL | Status: AC
  Administered 2023-05-22: 300 mg via ORAL
  Filled 2023-05-22: qty 2

## 2023-05-22 MED ORDER — CLINDAMYCIN HCL 150 MG PO CAPS
300.0000 mg | ORAL_CAPSULE | Freq: Four times a day (QID) | ORAL | 0 refills | Status: AC
  Filled 2023-05-22: qty 56, 7d supply, fill #0

## 2023-05-22 NOTE — Discharge Instructions (Signed)
You can take clindamycin, 300 mg every 6 hours for the next 1 week.  Return to the emergency department if you develop fever, increasing pain, difficulty with turning your neck, or if your symptoms have not improved within 48 hours.  I would also recommend taking an over-the-counter probiotic while you are taking clindamycin, as well as side effects of this medication is diarrhea.  Follow-up with your primary care doctor within 2 to 5 days.

## 2023-05-22 NOTE — ED Provider Notes (Signed)
EMERGENCY DEPARTMENT AT [facility] Provider Note   CSN: [id number] Arrival date & time: 05/22/23  4098     History  Chief Complaint  Patient presents with   Insect Bite    Mckaylah Bettendorf is a 52 y.o. female.  This is a 52 year old female is here today for some pain and swelling on the back of her neck.  Patient has a history of hypertension, is prediabetic.  She says that beginning last evening, she noticed that she had an area of swelling in the back of her neck that was painful.  Denies any trauma to the area.        Home Medications Prior to Admission medications   Medication Sig Start Date End Date Taking? Authorizing Provider  clindamycin (CLEOCIN) 150 MG capsule Take 2 capsules (300 mg total) by mouth every 6 (six) hours for 7 days. 05/22/23 05/29/23 Yes Anders Simmonds T, DO  albuterol (PROVENTIL) (2.5 MG/3ML) 0.083% nebulizer solution USE 1 VIAL VIA NEBULIZER EVERY 6 HOURS AS NEEDED FOR WHEEZING OR SHORTNESS OF BREATH 06/21/22   Storm Frisk, MD  albuterol (VENTOLIN HFA) 108 (90 Base) MCG/ACT inhaler Inhale 1-2 puffs into the lungs every 6 (six) hours as needed for wheezing or shortness of breath. 04/10/23   Storm Frisk, MD  cetirizine (ZYRTEC) 10 MG tablet Take 1 tablet (10 mg total) by mouth daily. 05/16/23   Storm Frisk, MD  hydrochlorothiazide (HYDRODIURIL) 25 MG tablet Take 1 tablet (25 mg total) by mouth daily. 05/16/23   Storm Frisk, MD  omeprazole (PRILOSEC) 40 MG capsule Take 1 capsule (40 mg total) by mouth daily. 05/16/23   Storm Frisk, MD  valsartan (DIOVAN) 40 MG tablet Take 1 tablet (40 mg total) by mouth daily. 05/16/23   Storm Frisk, MD      Allergies    Sulfa antibiotics, Aspirin, and Erythromycin    Review of Systems   Review of Systems  Physical Exam Updated Vital Signs BP (!) 190/114 (BP Location: Left Wrist)   Pulse 69   Temp 98 F (36.7 C) (Oral)   Ht 5\' 6"  (1.676 m)   Wt 112.9 kg   LMP   (LMP Unknown)   SpO2 100%   BMI 40.19 kg/m  Physical Exam Vitals reviewed.  Skin:    Comments: On the back of the neck, just superior to T1, patient has an area of induration, erythema and swelling.  Ultrasound did not show fluid collection.  Did show some cobblestoning.  No crepitus.  Neurological:     Mental Status: She is alert.     ED Results / Procedures / Treatments   Labs (all labs ordered are listed, but only abnormal results are displayed) Labs Reviewed - No data to display  EKG None  Radiology No results found.  Procedures Ultrasound ED Soft Tissue  Date/Time: 05/22/2023 7:35 AM  Performed by: Arletha Pili, DO Authorized by: Arletha Pili, DO   Procedure details:    Indications: localization of abscess     Transverse view:  Visualized   Longitudinal view:  Visualized   Images: archived   Location:    Location: neck     Side:  Midline Findings:     no abscess present    cellulitis present    no foreign body present     Medications Ordered in ED Medications  clindamycin (CLEOCIN) capsule 300 mg (has no administration in time range)    ED Course/ Medical  Decision Making/ A&P                                 Medical Decision Making 52 year old female here today with swelling at the nape of the neck.  Differential diagnoses include cellulitis, abscess, less likely traumatic injury, less likely vascular injury.  Plan # patient overall looks well, not systemically ill.  She does have an area of tenderness, erythema and warmth at the back of the neck.  Bedside ultrasound did not show a drainable fluid collection.  Symptoms have been ongoing for 1 day, is likely beginning to organize into a fluid collection.  Will treat this is a cellulitis.  Patient with allergy to sulfa.  Will prescribe clindamycin.  Discussed return precautions and typical timeline of treatment with patient.  She will return to the emergency department if symptoms worsen.  I  considered whether or not to order labs and imaging on this patient, however did not believe it would change management.  Risk Prescription drug management.           Final Clinical Impression(s) / ED Diagnoses Final diagnoses:  Cellulitis, neck    Rx / DC Orders ED Discharge Orders          Ordered    clindamycin (CLEOCIN) 150 MG capsule  Every 6 hours        05/22/23 0732              Anders Simmonds T, DO 05/22/23 [phone]

## 2023-05-22 NOTE — ED Triage Notes (Signed)
Patient ambulated to room 16 c/o swelling and itching to nape of neck since Saturday. Patient states "I think I got bit by something on Saturday". Patient reports swelling worsened over night. Swelling noted to posterior nape of neck.

## 2023-06-05 ENCOUNTER — Telehealth: Payer: Self-pay | Admitting: *Deleted

## 2023-06-05 NOTE — Telephone Encounter (Signed)
Copied from CRM [id number]. Topic: General - Other >> Jun 05, 2023 12:53 PM Dondra Prader A wrote: Reason for CRM: Patient states that she seen Dr. Delford Field on 05/16/2023 and he was suppose to order her mammogram and she has not heard anything. Patient states that she signed off on a paper for a scholarship where she will not have to pay for the mammogram and Dr. Delford Field knows this is a concern of hers to get her mammogram due to her having a knot. Please call pt back to discuss.

## 2023-06-07 ENCOUNTER — Telehealth: Payer: Self-pay

## 2023-06-07 NOTE — Telephone Encounter (Signed)
Telephoned patient at mobile number. After answering [facility] screening questions patient ineligible for program.

## 2023-06-07 NOTE — Telephone Encounter (Signed)
Called patient and gave her the number for the mammogram

## 2023-06-12 ENCOUNTER — Other Ambulatory Visit: Payer: Self-pay

## 2023-06-23 ENCOUNTER — Ambulatory Visit: Payer: Self-pay

## 2023-06-23 ENCOUNTER — Other Ambulatory Visit: Payer: Self-pay | Admitting: Critical Care Medicine

## 2023-06-23 DIAGNOSIS — J4520 Mild intermittent asthma, uncomplicated: Secondary | ICD-10-CM

## 2023-06-23 NOTE — Telephone Encounter (Signed)
  Chief Complaint: cough Symptoms: wheezing Frequency: yest Pertinent Negatives: Patient denies CP, fever Disposition: [x] ED /[] Urgent Care (no appt availability in office) / [] Appointment(In office/virtual)/ []  [facility]/ [] Home Care/ [] Refused Recommended Disposition /[] [place] [facility]/ []  Follow-up with PCP Additional Notes: to ED  Reason for Disposition  [1] MODERATE difficulty breathing (e.g., speaks in phrases, SOB even at rest, pulse 100-120) AND [2] NEW-onset or WORSE than normal  Answer Assessment - Initial Assessment Questions 1. RESPIRATORY STATUS: "Describe your breathing?" (e.g., wheezing, shortness of breath, unable to speak, severe coughing)      SOB, wheezing 2. ONSET: "When did this breathing problem begin?"      yest 3. PATTERN "Does the difficult breathing come and go, or has it been constant since it started?"      constant 4. SEVERITY: "How bad is your breathing?" (e.g., mild, moderate, severe)    - MILD: No SOB at rest, mild SOB with walking, speaks normally in sentences, can lie down, no retractions, pulse < 100.    - MODERATE: SOB at rest, SOB with minimal exertion and prefers to sit, cannot lie down flat, speaks in phrases, mild retractions, audible wheezing, pulse 100-120.    - SEVERE: Very SOB at rest, speaks in single words, struggling to breathe, sitting hunched forward, retractions, pulse > 120      mod 5. RECURRENT SYMPTOM: "Have you had difficulty breathing before?" If Yes, ask: "When was the last time?" and "What happened that time?"      yes 6. CARDIAC HISTORY: "Do you have any history of heart disease?" (e.g., heart attack, angina, bypass surgery, angioplasty)      N/a 7. LUNG HISTORY: "Do you have any history of lung disease?"  (e.g., pulmonary embolus, asthma, emphysema)     asthma 9. OTHER SYMPTOMS: "Do you have any other symptoms? (e.g., dizziness, runny nose, cough, chest pain, fever)     Chest feels tight cough  Protocols  used: Breathing Difficulty-A-AH

## 2023-06-24 ENCOUNTER — Encounter (HOSPITAL_COMMUNITY): Payer: Self-pay

## 2023-06-24 ENCOUNTER — Other Ambulatory Visit: Payer: Self-pay

## 2023-06-24 ENCOUNTER — Emergency Department (HOSPITAL_COMMUNITY)
Admission: EM | Admit: 2023-06-24 | Discharge: 2023-06-24 | Disposition: A | Discharge status: Home / Self Care | Payer: Self-pay | Attending: Emergency Medicine | Admitting: Emergency Medicine

## 2023-06-24 ENCOUNTER — Emergency Department (HOSPITAL_COMMUNITY): Payer: Self-pay

## 2023-06-24 DIAGNOSIS — I10 Essential (primary) hypertension: Secondary | ICD-10-CM | POA: Insufficient documentation

## 2023-06-24 DIAGNOSIS — J4521 Mild intermittent asthma with (acute) exacerbation: Secondary | ICD-10-CM | POA: Insufficient documentation

## 2023-06-24 DIAGNOSIS — Z1152 Encounter for screening for COVID-19: Secondary | ICD-10-CM | POA: Insufficient documentation

## 2023-06-24 DIAGNOSIS — E119 Type 2 diabetes mellitus without complications: Secondary | ICD-10-CM | POA: Insufficient documentation

## 2023-06-24 DIAGNOSIS — Z79899 Other long term (current) drug therapy: Secondary | ICD-10-CM | POA: Insufficient documentation

## 2023-06-24 DIAGNOSIS — B349 Viral infection, unspecified: Secondary | ICD-10-CM | POA: Insufficient documentation

## 2023-06-24 LAB — RESP PANEL BY RT-PCR (RSV, FLU A&B, COVID)  RVPGX2
Influenza A by PCR: NEGATIVE
Influenza B by PCR: NEGATIVE
Resp Syncytial Virus by PCR: NEGATIVE
SARS Coronavirus 2 by RT PCR: NEGATIVE

## 2023-06-24 MED ORDER — IPRATROPIUM-ALBUTEROL 0.5-2.5 (3) MG/3ML IN SOLN
3.0000 mL | Freq: Once | RESPIRATORY_TRACT | Status: DC
  Filled 2023-06-24: qty 3

## 2023-06-24 MED ORDER — BENZONATATE 100 MG PO CAPS
100.0000 mg | ORAL_CAPSULE | Freq: Once | ORAL | Status: AC
  Administered 2023-06-24: 100 mg via ORAL
  Filled 2023-06-24: qty 1

## 2023-06-24 MED ORDER — BENZONATATE 100 MG PO CAPS: 100.0000 mg | ORAL_CAPSULE | Freq: Three times a day (TID) | ORAL | 0 refills | Status: DC

## 2023-06-24 MED ORDER — PREDNISONE 20 MG PO TABS
60.0000 mg | ORAL_TABLET | Freq: Once | ORAL | Status: AC
  Administered 2023-06-24: 60 mg via ORAL
  Filled 2023-06-24: qty 3

## 2023-06-24 MED ORDER — PREDNISONE 20 MG PO TABS: ORAL_TABLET | ORAL | 0 refills | Status: DC

## 2023-06-24 NOTE — ED Notes (Signed)
Pt refused the ambulation, EDP notified,  EDP said ok to d/c without performing task

## 2023-06-24 NOTE — ED Provider Notes (Signed)
[place] EMERGENCY DEPARTMENT AT [facility] Provider Note   CSN: [id number] Arrival date & time: 06/24/23  1154     History  Chief Complaint  Patient presents with   Shortness of Breath    Angela Burton is a 52 y.o. female.  The history is provided by the patient and medical records. No language interpreter was used.  Shortness of Breath    52 year old female with significant history of asthma, hypertension, obesity, diabetes, presenting to the ER with via EMS from home with complaints of cold symptoms.  Patient report for the past 2 days she has had subjective fever, chills, body aches, congestion, dry cough, wheezing, shortness of breath and overall not feeling well.  She feels fatigued.  She endorsed recent sick contact.  She states as she has history of asthma and is usually pretty well-controlled but she is happy to use inhaler more lately.  She denies any history of PE or DVT.  No nausea vomiting diarrhea no hemoptysis.  Home Medications Prior to Admission medications   Medication Sig Start Date End Date Taking? Authorizing Provider  albuterol (PROVENTIL) (2.5 MG/3ML) 0.083% nebulizer solution USE 1 VIAL VIA NEBULIZER EVERY 6 HOURS AS NEEDED FOR WHEEZING OR SHORTNESS OF BREATH 06/21/22   Storm Frisk, MD  albuterol (VENTOLIN HFA) 108 (90 Base) MCG/ACT inhaler Inhale 1-2 puffs into the lungs every 6 (six) hours as needed for wheezing or shortness of breath. 04/10/23   Storm Frisk, MD  cetirizine (ZYRTEC) 10 MG tablet Take 1 tablet (10 mg total) by mouth daily. 05/16/23   Storm Frisk, MD  hydrochlorothiazide (HYDRODIURIL) 25 MG tablet Take 1 tablet (25 mg total) by mouth daily. 05/16/23   Storm Frisk, MD  omeprazole (PRILOSEC) 40 MG capsule Take 1 capsule (40 mg total) by mouth daily. 05/16/23   Storm Frisk, MD  valsartan (DIOVAN) 40 MG tablet Take 1 tablet (40 mg total) by mouth daily. 05/16/23   Storm Frisk, MD      Allergies     Sulfa antibiotics, Aspirin, and Erythromycin    Review of Systems   Review of Systems  Respiratory:  Positive for shortness of breath.   All other systems reviewed and are negative.   Physical Exam Updated Vital Signs BP (!) 157/101 (BP Location: Right Arm)   Pulse 91   Temp 97.9 F (36.6 C) (Axillary)   Resp (!) 23   Ht 5\' 6"  (1.676 m)   Wt 113 kg   LMP  (LMP Unknown)   SpO2 100%   BMI 40.21 kg/m  Physical Exam Vitals and nursing note reviewed.  Constitutional:      General: She is not in acute distress.    Appearance: She is well-developed.  HENT:     Head: Atraumatic.  Eyes:     Conjunctiva/sclera: Conjunctivae normal.  Cardiovascular:     Rate and Rhythm: Normal rate and regular rhythm.  Pulmonary:     Effort: Pulmonary effort is normal.     Breath sounds: Decreased breath sounds and wheezing present. No rhonchi or rales.  Musculoskeletal:     Cervical back: Neck supple.  Skin:    Findings: No rash.  Neurological:     Mental Status: She is alert.  Psychiatric:        Mood and Affect: Mood normal.     ED Results / Procedures / Treatments   Labs (all labs ordered are listed, but only abnormal results are displayed) Labs Reviewed  RESP PANEL BY RT-PCR (RSV, FLU A&B, COVID)  [id number]    EKG None  Radiology DG Chest 2 View  Result Date: 06/24/2023 CLINICAL DATA:  Shortness of breath and upper respiratory infection for 2 days. EXAM: CHEST - 2 VIEW COMPARISON:  03/25/2022 FINDINGS: Lordotic positioning noted. The heart size and mediastinal contours are within normal limits. Both lungs are clear. The visualized skeletal structures are unremarkable. IMPRESSION: No active cardiopulmonary disease. Electronically Signed   By: Danae Orleans M.D.   On: 06/24/2023 12:56    Procedures Procedures    Medications Ordered in ED Medications  benzonatate (TESSALON) capsule 100 mg (has no administration in time range)  ipratropium-albuterol (DUONEB) 0.5-2.5 (3) MG/3ML  nebulizer solution 3 mL (has no administration in time range)  predniSONE (DELTASONE) tablet 60 mg (has no administration in time range)    ED Course/ Medical Decision Making/ A&P                                 Medical Decision Making Amount and/or Complexity of Data Reviewed Radiology: ordered.  Risk Prescription drug management.   BP (!) 157/101 (BP Location: Right Arm)   Pulse 91   Temp 97.9 F (36.6 C) (Axillary)   Resp (!) 23   Ht 5\' 6"  (1.676 m)   Wt 113 kg   LMP  (LMP Unknown)   SpO2 100%   BMI 40.21 kg/m   82:79 PM  51 year old female with significant history of asthma, hypertension, obesity, diabetes, presenting to the ER with via EMS from home with complaints of cold symptoms.  Patient report for the past 2 days she has had subjective fever, chills, body aches, congestion, dry cough, wheezing, shortness of breath and overall not feeling well.  She feels fatigued.  She endorsed recent sick contact.  She states as she has history of asthma and is usually pretty well-controlled but she is happy to use inhaler more lately.  She denies any history of PE or DVT.  No nausea vomiting diarrhea no hemoptysis.  Exam remarkable for persistent coughing, patient does have decreased breath sounds with inspiratory and expiratory wheezes.  Vital signs however reassuring no hypoxia no fever.  -Labs ordered, independently viewed and interpreted by me.  Labs remarkable for negative covid/flu/rsv -The patient was maintained on a cardiac monitor.  I personally viewed and interpreted the cardiac monitored which showed an underlying rhythm of: NSR -Imaging independently viewed and interpreted by me and I agree with radiologist's interpretation.  Result remarkable for CXR without acute changes -This patient presents to the ED for concern of sob, this involves an extensive number of treatment options, and is a complaint that carries with it a high risk of complications and morbidity.  The  differential diagnosis includes asthma exacerbation, viral ilness, pneumonia, PE, PNA -Co morbidities that complicate the patient evaluation includes asthma -Treatment includes duonebs, tessalon, prednisone -Reevaluation of the patient after these medicines showed that the patient improved -PCP office notes or outside notes reviewed -Escalation to admission/observation considered: patients feels much better, is comfortable with discharge, and will follow up with PCP -Prescription medication considered, patient comfortable with prednisone, tessalone -Social Determinant of Health considered which includes tobacco use.          Final Clinical Impression(s) / ED Diagnoses Final diagnoses:  Mild intermittent asthma with exacerbation  Viral illness    Rx / DC Orders ED Discharge Orders  Ordered    predniSONE (DELTASONE) 20 MG tablet        06/24/23 1408    benzonatate (TESSALON) 100 MG capsule  Every 8 hours        06/24/23 1408              Fayrene Helper, PA-C 06/24/23 1409    Derwood Kaplan, MD 06/25/23 1042

## 2023-06-24 NOTE — ED Triage Notes (Signed)
Pt BIBA for shortness of breath and cold symptoms x 2 days.   Pt states she was around someone that has cold symptoms Pt endorses, hx of Asthma and Pneumonia, chills, sweats, cough.  Pt denies mucous with cough, or oxygen use at home  Pt is A&Ox4

## 2023-06-24 NOTE — ED Notes (Signed)
Patient back on floor from testing.

## 2023-06-26 ENCOUNTER — Other Ambulatory Visit: Payer: Self-pay

## 2023-06-26 MED ORDER — ALBUTEROL SULFATE (2.5 MG/3ML) 0.083% IN NEBU
2.5000 mg | INHALATION_SOLUTION | Freq: Four times a day (QID) | RESPIRATORY_TRACT | 0 refills | Status: AC | PRN
  Filled 2023-06-26 – 2023-10-16 (×3): qty 180, 15d supply, fill #0

## 2023-06-26 NOTE — Telephone Encounter (Signed)
Requested Prescriptions  Pending Prescriptions Disp Refills   albuterol (PROVENTIL) (2.5 MG/3ML) 0.083% nebulizer solution 180 mL 0    Sig: USE 1 VIAL VIA NEBULIZER EVERY 6 HOURS AS NEEDED FOR WHEEZING OR SHORTNESS OF BREATH     Pulmonology:  Beta Agonists 2 Failed - 06/23/2023  6:28 PM      Failed - Last BP in normal range    BP Readings from Last 1 Encounters:  06/24/23 (!) 157/101         Passed - Last Heart Rate in normal range    Pulse Readings from Last 1 Encounters:  06/24/23 91         Passed - Valid encounter within last 12 months    Recent Outpatient Visits           1 month ago Essential hypertension   [place] [facility] Storm Frisk, MD   5 months ago BMI 40.0-44.9, adult [facility])   [place] [facility] Storm Frisk, MD   7 months ago Primary hypertension   [place] [facility] Storm Frisk, MD   10 months ago Essential hypertension   [facility] [place], Cornelius Moras, RPH-CPP   12 months ago Intravenous infiltration, subsequent encounter   [facility], Marzella Schlein, [place]       Future Appointments             In 3 months Hoy Register, MD [facility]

## 2023-07-07 ENCOUNTER — Inpatient Hospital Stay: Payer: Self-pay | Admitting: Nurse Practitioner

## 2023-07-12 ENCOUNTER — Other Ambulatory Visit: Payer: Self-pay

## 2023-08-23 ENCOUNTER — Other Ambulatory Visit: Payer: Self-pay | Admitting: Critical Care Medicine

## 2023-08-23 DIAGNOSIS — J4520 Mild intermittent asthma, uncomplicated: Secondary | ICD-10-CM

## 2023-08-24 ENCOUNTER — Other Ambulatory Visit: Payer: Self-pay

## 2023-08-24 MED ORDER — ALBUTEROL SULFATE HFA 108 (90 BASE) MCG/ACT IN AERS
1.0000 | INHALATION_SPRAY | Freq: Four times a day (QID) | RESPIRATORY_TRACT | 1 refills | Status: DC | PRN
Start: 2023-08-24 — End: 2023-10-18
  Filled 2023-08-24: qty 6.7, 25d supply, fill #0
  Filled 2023-10-16 (×2): qty 6.7, 25d supply, fill #1

## 2023-08-25 ENCOUNTER — Other Ambulatory Visit: Payer: Self-pay

## 2023-09-18 ENCOUNTER — Other Ambulatory Visit: Payer: Self-pay

## 2023-10-16 ENCOUNTER — Other Ambulatory Visit: Payer: Self-pay

## 2023-10-18 ENCOUNTER — Encounter: Payer: Self-pay | Admitting: Family Medicine

## 2023-10-18 ENCOUNTER — Ambulatory Visit: Payer: Medicaid Other | Attending: Family Medicine | Admitting: Family Medicine

## 2023-10-18 ENCOUNTER — Other Ambulatory Visit: Payer: Self-pay

## 2023-10-18 ENCOUNTER — Other Ambulatory Visit: Payer: Self-pay | Admitting: Pharmacist

## 2023-10-18 ENCOUNTER — Encounter: Payer: Self-pay | Admitting: Internal Medicine

## 2023-10-18 VITALS — BP 133/83 | HR 60 | Ht 66.0 in | Wt 254.8 lb

## 2023-10-18 DIAGNOSIS — Z1231 Encounter for screening mammogram for malignant neoplasm of breast: Secondary | ICD-10-CM

## 2023-10-18 DIAGNOSIS — J4520 Mild intermittent asthma, uncomplicated: Secondary | ICD-10-CM

## 2023-10-18 DIAGNOSIS — K219 Gastro-esophageal reflux disease without esophagitis: Secondary | ICD-10-CM | POA: Diagnosis not present

## 2023-10-18 DIAGNOSIS — Z1211 Encounter for screening for malignant neoplasm of colon: Secondary | ICD-10-CM

## 2023-10-18 DIAGNOSIS — E1159 Type 2 diabetes mellitus with other circulatory complications: Secondary | ICD-10-CM | POA: Diagnosis not present

## 2023-10-18 DIAGNOSIS — F410 Panic disorder [episodic paroxysmal anxiety] without agoraphobia: Secondary | ICD-10-CM | POA: Diagnosis not present

## 2023-10-18 DIAGNOSIS — E1169 Type 2 diabetes mellitus with other specified complication: Secondary | ICD-10-CM | POA: Diagnosis not present

## 2023-10-18 DIAGNOSIS — Z7985 Long-term (current) use of injectable non-insulin antidiabetic drugs: Secondary | ICD-10-CM | POA: Diagnosis not present

## 2023-10-18 DIAGNOSIS — N3281 Overactive bladder: Secondary | ICD-10-CM

## 2023-10-18 DIAGNOSIS — I152 Hypertension secondary to endocrine disorders: Secondary | ICD-10-CM | POA: Diagnosis not present

## 2023-10-18 LAB — POCT GLYCOSYLATED HEMOGLOBIN (HGB A1C): HbA1c, POC (controlled diabetic range): 6.5 % (ref 0.0–7.0)

## 2023-10-18 MED ORDER — BUDESONIDE-FORMOTEROL FUMARATE 80-4.5 MCG/ACT IN AERO
2.0000 | INHALATION_SPRAY | Freq: Two times a day (BID) | RESPIRATORY_TRACT | 3 refills | Status: DC
  Filled 2023-10-18: qty 10.2, 30d supply, fill #0
  Filled 2024-02-12: qty 10.2, 30d supply, fill #1
  Filled 2024-05-10: qty 10.2, 30d supply, fill #2

## 2023-10-18 MED ORDER — ACCU-CHEK GUIDE TEST VI STRP
ORAL_STRIP | 12 refills | Status: AC
  Filled 2023-10-18: qty 100, 34d supply, fill #0

## 2023-10-18 MED ORDER — ALBUTEROL SULFATE HFA 108 (90 BASE) MCG/ACT IN AERS
1.0000 | INHALATION_SPRAY | Freq: Four times a day (QID) | RESPIRATORY_TRACT | 1 refills | Status: DC | PRN
  Filled 2023-10-18: qty 18, 25d supply, fill #0
  Filled 2023-11-09 – 2023-11-10 (×6): qty 18, 25d supply, fill #1

## 2023-10-18 MED ORDER — HYDROXYZINE HCL 25 MG PO TABS
25.0000 mg | ORAL_TABLET | Freq: Three times a day (TID) | ORAL | 1 refills | Status: AC | PRN
  Filled 2023-10-18: qty 60, 20d supply, fill #0
  Filled 2024-03-13: qty 60, 20d supply, fill #1

## 2023-10-18 MED ORDER — OMEPRAZOLE 40 MG PO CPDR
40.0000 mg | DELAYED_RELEASE_CAPSULE | Freq: Every day | ORAL | 1 refills | Status: DC
  Filled 2023-10-18 – 2023-11-10 (×3): qty 90, 90d supply, fill #0
  Filled 2024-03-13: qty 90, 90d supply, fill #1

## 2023-10-18 MED ORDER — HYDROCHLOROTHIAZIDE 25 MG PO TABS
25.0000 mg | ORAL_TABLET | Freq: Every day | ORAL | 1 refills | Status: DC
  Filled 2023-10-18 – 2023-11-10 (×3): qty 90, 90d supply, fill #0
  Filled 2024-03-13: qty 90, 90d supply, fill #1

## 2023-10-18 MED ORDER — MISC. DEVICES MISC: 0 refills | Status: AC

## 2023-10-18 MED ORDER — OZEMPIC (0.25 OR 0.5 MG/DOSE) 2 MG/3ML ~~LOC~~ SOPN
0.5000 mg | PEN_INJECTOR | SUBCUTANEOUS | 5 refills | Status: DC
  Filled 2023-10-18: qty 3, fill #0
  Filled 2023-12-18: qty 3, 28d supply, fill #0
  Filled 2024-01-15: qty 3, 28d supply, fill #1
  Filled 2024-02-12: qty 3, 28d supply, fill #2

## 2023-10-18 MED ORDER — ACCU-CHEK GUIDE W/DEVICE KIT
1.0000 | PACK | Freq: Three times a day (TID) | 0 refills | Status: AC
  Filled 2023-10-18: qty 1, 30d supply, fill #0

## 2023-10-18 MED ORDER — VALSARTAN 40 MG PO TABS
40.0000 mg | ORAL_TABLET | Freq: Every day | ORAL | 1 refills | Status: DC
  Filled 2023-10-18 – 2023-11-10 (×4): qty 90, 90d supply, fill #0
  Filled 2024-03-13: qty 90, 90d supply, fill #1

## 2023-10-18 MED ORDER — ACCU-CHEK SOFTCLIX LANCETS MISC
6 refills | Status: AC
  Filled 2023-10-18: qty 100, 34d supply, fill #0

## 2023-10-18 MED ORDER — OZEMPIC (0.25 OR 0.5 MG/DOSE) 2 MG/3ML ~~LOC~~ SOPN
PEN_INJECTOR | SUBCUTANEOUS | 0 refills | Status: AC
  Filled 2023-10-18 – 2023-11-10 (×5): qty 3, 42d supply, fill #0

## 2023-10-18 NOTE — Progress Notes (Signed)
 Subjective:  Patient ID: Angela Burton, female    DOB: Nov 16, 1970  Age: 53 y.o. MRN: [id number]  CC: Medical Management of Chronic Issues (Anxiety)   HPI: Angela Burton is a 53 y.o. year old female with a medical history significant for Hypertension, GERD, Prediabetes, Asthma.  Discussed the use of AI scribe software for clinical note transcription with the patient, who gave verbal consent to proceed.  History of Present Illness The patient, with a history of hypertension, acid reflux, and prediabetes, presents with concerns about anxiety. She describes episodes of feeling hot, lightheaded, and experiencing shortness of breath, which she believes are triggered by stress and can last up to thirty minutes. These episodes have been occurring approximately once or twice a month for the past year.  The patient also reports needing to use her albuterol inhaler for asthma more frequently due to recent weather changes, using it about five times in the past week.  Her blood pressure is controlled and she endorses adherence with her antihypertensive. Labs reveal a new diagnosis of type 2 diabetes mellitus with an A1c of 6.5 today which is up from 6.3 in 07/2022.  Additionally, the patient mentions a weak bladder, which she believes has worsened since her hysterectomy a year ago. She reports a feeling of urgency and occasional incontinence.    Past Medical History:  Diagnosis Date   Asthma    albuterol   Complication of anesthesia    Patient states she had to be revived after her tubes were tied years ago   Diabetes mellitus without complication (HCC)    Diagnosed years ago; no meds in 10 years after losing weight   Fibroid, uterine 03/24/2020   Hypertension    Hyperthyroidism    Irregular menses 06/22/2020   Obesity     Past Surgical History:  Procedure Laterality Date   DILATION AND CURETTAGE OF UTERUS     HYSTEROSCOPY WITH D & C N/A 05/05/2020   Procedure: DILATATION AND CURETTAGE  /HYSTEROSCOPY AND IUD REMOVAL;  Surgeon: Warden Fillers, MD;  Location: [facility];  Service: Gynecology;  Laterality: N/A;   LAPAROSCOPIC OVARIAN CYSTECTOMY Right 05/05/2020   Procedure: OPERATIVE LAPAROSCOPY AND LAPAROSCOPIC OVARIAN CYSTECTOMY;  Surgeon: Warden Fillers, MD;  Location: [facility];  Service: Gynecology;  Laterality: Right;   TUBAL LIGATION     VAGINAL HYSTERECTOMY Bilateral 05/31/2022   Procedure: HYSTERECTOMY VAGINAL WITH SALPINGECTOMY;  Surgeon: Warden Fillers, MD;  Location: [facility] OR;  Service: Gynecology;  Laterality: Bilateral;    Family History  Problem Relation Age of Onset   Heart attack Mother    Diabetes Father    Hypertension Father    High Cholesterol Father    Breast cancer Sister    HIV Sister     Social History   Socioeconomic History   Marital status: Legally Separated    Spouse name: Not on file   Number of children: Not on file   Years of education: Not on file   Highest education level: 12th grade  Occupational History   Not on file  Tobacco Use   Smoking status: Former    Current packs/day: 0.00    Types: Cigarettes    Quit date: 08/20/2022    Years since quitting: 1.1   Smokeless tobacco: Never   Tobacco comments:    3 cigarettes/2 wks  Vaping Use   Vaping status: Never Used  Substance and Sexual Activity   Alcohol use: Yes    Alcohol/week:  2.0 standard drinks of alcohol    Types: 2 Glasses of wine per week    Comment: rarely   Drug use: Yes    Types: Marijuana    Comment: sometimes    Sexual activity: Yes    Birth control/protection: Surgical    Comment: hysterectomy  Other Topics Concern   Not on file  Social History Narrative   Not on file   Social Drivers of Health   Financial Resource Strain: Medium Risk (05/15/2023)   Overall Financial Resource Strain (CARDIA)    Difficulty of Paying Living Expenses: Somewhat hard  Food Insecurity: Food Insecurity Present (05/15/2023)   Hunger Vital  Sign    Worried About Running Out of Food in the Last Year: Sometimes true    Ran Out of Food in the Last Year: Sometimes true  Transportation Needs: No Transportation Needs (05/15/2023)   PRAPARE - Administrator, Civil Service (Medical): No    Lack of Transportation (Non-Medical): No  Physical Activity: Sufficiently Active (05/15/2023)   Exercise Vital Sign    Days of Exercise per Week: 4 days    Minutes of Exercise per Session: 50 min  Stress: No Stress Concern Present (05/15/2023)   [employer] of Occupational Health - Occupational Stress Questionnaire    Feeling of Stress : Only a little  Social Connections: Unknown (05/15/2023)   Social Connection and Isolation Panel [NHANES]    Frequency of Communication with Friends and Family: Twice a week    Frequency of Social Gatherings with Friends and Family: Patient declined    Attends Religious Services: 1 to 4 times per year    Active Member of [employer] or Organizations: Yes    Attends Banker Meetings: 1 to 4 times per year    Marital Status: Living with partner    Allergies  Allergen Reactions   Sulfa Antibiotics Anaphylaxis, Hives, Swelling and Rash   Aspirin Hives    Able to take ibuprofen   Erythromycin Other (See Comments)    Had a seizure.     Outpatient Medications Prior to Visit  Medication Sig Dispense Refill   albuterol (PROVENTIL) (2.5 MG/3ML) 0.083% nebulizer solution USE 1 VIAL VIA NEBULIZER EVERY 6 HOURS AS NEEDED FOR WHEEZING OR SHORTNESS OF BREATH 180 mL 0   albuterol (VENTOLIN HFA) 108 (90 Base) MCG/ACT inhaler Inhale 1-2 puffs into the lungs every 6 (six) hours as needed for wheezing or shortness of breath. 6.7 g 1   benzonatate (TESSALON) 100 MG capsule Take 1 capsule (100 mg total) by mouth every 8 (eight) hours. 21 capsule 0   cetirizine (ZYRTEC) 10 MG tablet Take 1 tablet (10 mg total) by mouth daily. 60 tablet 11   hydrochlorothiazide (HYDRODIURIL) 25 MG tablet Take 1 tablet (25 mg  total) by mouth daily. 90 tablet 1   Multiple Vitamins-Minerals (MULTIVITAMIN WITH MINERALS) tablet Take 1 tablet by mouth daily.     omeprazole (PRILOSEC) 40 MG capsule Take 1 capsule (40 mg total) by mouth daily. 90 capsule 1   predniSONE (DELTASONE) 20 MG tablet 3 tabs po day one, then 2 tabs daily x 4 days 11 tablet 0   Pseudoeph-Doxylamine-DM-APAP (NYQUIL PO) Take 1 Dose by mouth every 4 (four) hours as needed (cough).     Pseudoephedrine-DM-GG (ROBITUSSIN COLD & COUGH PO) Take 1 Dose by mouth every 4 (four) hours as needed (cough).     valsartan (DIOVAN) 40 MG tablet Take 1 tablet (40 mg total) by mouth daily. 90  tablet 2   No facility-administered medications prior to visit.     ROS Review of Systems  Constitutional:  Negative for activity change and appetite change.  HENT:  Negative for sinus pressure and sore throat.   Respiratory:  Negative for chest tightness, shortness of breath and wheezing.   Cardiovascular:  Negative for chest pain and palpitations.  Gastrointestinal:  Negative for abdominal distention, abdominal pain and constipation.  Genitourinary: Negative.   Musculoskeletal: Negative.   Psychiatric/Behavioral:  Negative for behavioral problems and dysphoric mood.        Positive for anxiety    Objective:  BP 133/83   Pulse 60   Ht 5\' 6"  (1.676 m)   Wt 254 lb 12.8 oz (115.6 kg)   LMP  (LMP Unknown)   SpO2 100%   BMI 41.13 kg/m      10/18/2023    8:43 AM 06/24/2023   12:37 PM 06/24/2023   12:04 PM  BP/Weight  Systolic BP 133  161  Diastolic BP 83  101  Wt. (Lbs) 254.8 249.12   BMI 41.13 kg/m2 40.21 kg/m2       Physical Exam Constitutional:      Appearance: She is well-developed.  Cardiovascular:     Rate and Rhythm: Normal rate.     Heart sounds: Normal heart sounds. No murmur heard. Pulmonary:     Effort: Pulmonary effort is normal.     Breath sounds: Normal breath sounds. No wheezing or rales.  Chest:     Chest wall: No tenderness.   Abdominal:     General: Bowel sounds are normal. There is no distension.     Palpations: Abdomen is soft. There is no mass.     Tenderness: There is no abdominal tenderness.  Musculoskeletal:        General: Normal range of motion.     Right lower leg: No edema.     Left lower leg: No edema.  Neurological:     Mental Status: She is alert and oriented to person, place, and time.  Psychiatric:        Mood and Affect: Mood normal.        Latest Ref Rng & Units 10/22/2022   11:23 AM 08/04/2022    8:54 AM 05/20/2022    8:44 AM  CMP  Glucose 70 - 99 mg/dL 95  096  045   BUN 6 - 20 mg/dL 15  8  6    Creatinine 0.44 - 1.00 mg/dL 4.09  8.11  9.14   Sodium 135 - 145 mmol/L 137  141  140   Potassium 3.5 - 5.1 mmol/L 3.8  4.3  3.8   Chloride 98 - 111 mmol/L 99  100  110   CO2 22 - 32 mmol/L 32  25  23   Calcium 8.9 - 10.3 mg/dL 9.6  9.6  8.7   Total Protein 6.5 - 8.1 g/dL 7.7     Total Bilirubin 0.3 - 1.2 mg/dL 0.5     Alkaline Phos 38 - 126 U/L 80     AST 15 - 41 U/L 11     ALT 0 - 44 U/L 12       Lipid Panel     Component Value Date/Time   CHOL 202 (H) 04/22/2020 1100   TRIG 128 04/22/2020 1100   HDL 51 04/22/2020 1100   CHOLHDL 4.0 04/22/2020 1100   LDLCALC 128 (H) 04/22/2020 1100    CBC    Component Value Date/Time  WBC 8.9 10/22/2022 1123   RBC 5.14 (H) 10/22/2022 1123   HGB 12.2 10/22/2022 1123   HGB 13.7 05/04/2021 1115   HCT 39.4 10/22/2022 1123   HCT 42.9 05/04/2021 1115   PLT 372 10/22/2022 1123   PLT 333 05/04/2021 1115   MCV 76.7 (L) 10/22/2022 1123   MCV 81 05/04/2021 1115   MCH 23.7 (L) 10/22/2022 1123   MCHC 31.0 10/22/2022 1123   RDW 19.5 (H) 10/22/2022 1123   RDW 15.1 05/04/2021 1115   LYMPHSABS 3.5 10/22/2022 1123   LYMPHSABS 3.4 (H) 05/04/2021 1115   MONOABS 0.7 10/22/2022 1123   EOSABS 0.1 10/22/2022 1123   EOSABS 0.2 05/04/2021 1115   BASOSABS 0.0 10/22/2022 1123   BASOSABS 0.1 05/04/2021 1115    Lab Results  Component Value Date    HGBA1C 6.3 (H) 08/04/2022    Assessment & Plan:   Assessment & Plan Anxiety/Panic Attacks Episodes of anxiety with symptoms of lightheadedness, shortness of breath, and feeling hot, occurring approximately once or twice a month, often triggered by stressors. -Start Hydroxyzine as needed for anxiety episodes. -If symptoms are uncontrolled and increase in frequency consider an SSRI  Asthma Increased use of Albuterol inhaler due to weather changes. -Will need to upgrade asthma management -Start Symbicort inhaler, twice daily.  Type 2 Diabetes Mellitus Newly diagnosed with an A1C of 6.5, patient has a history of prediabetes and is aware of dietary control measures. -Start Ozempic 0.25mg  weekly for 4 weeks, then increase to 0.5mg  weekly. -Provide glucose meter for intermittent monitoring of blood glucose levels. -Will need to place on statin for primary prevention but will check lipid panel first  Overactive bladder Reports of urgency and occasional incontinence since hysterectomy. -Advise Kegel exercises for bladder control. -Consider initiation of medication at next visit if symptoms persist  Hypertension and Acid Reflux Well controlled on current medications. -Continue current medications.  General Health Maintenance / Followup Plans -Order screening mammogram.  In the past she had stated she could not undergo this due to lack of insurance and had had a right breast lump at the time.   -Order colonoscopy for routine screening. -Check blood work today. -Plan to follow up on the effectiveness of new medications and overall health status.      No orders of the defined types were placed in this encounter.   Follow-up: No follow-ups on file.       Hoy Register, MD, FAAFP. [facility] [place], [place] [phone]   10/18/2023, 8:44 AM

## 2023-10-18 NOTE — Patient Instructions (Signed)
 VISIT SUMMARY:  During today's visit, we discussed your concerns about anxiety, increased use of your asthma inhaler, newly diagnosed type 2 diabetes, and urinary incontinence. We also reviewed your hypertension and acid reflux management, and planned for general health maintenance.  YOUR PLAN:  -ANXIETY/PANIC ATTACKS: Anxiety can cause episodes of feeling hot, lightheaded, and short of breath, often triggered by stress. We will start you on Hydroxyzine to use as needed for these anxiety episodes.  -ASTHMA: Asthma is a condition where your airways narrow and swell, causing difficulty in breathing. Due to increased use of your Albuterol inhaler, we will start you on Symbicort inhaler, to be used twice daily.  -TYPE 2 DIABETES MELLITUS: Type 2 diabetes is a condition that affects the way your body processes blood sugar. We will start you on Ozempic, beginning with 0.25mg  weekly for 4 weeks, then increasing to 0.5mg  weekly. You will also receive a glucose meter to monitor your blood sugar levels intermittently.  -URINARY INCONTINENCE: Urinary incontinence is the loss of bladder control, leading to occasional leakage. We recommend you start doing Kegel exercises to help strengthen your bladder control.  -HYPERTENSION AND ACID REFLUX: Hypertension is high blood pressure, and acid reflux is when stomach acid frequently flows back into the tube connecting your mouth and stomach. Both conditions are well controlled with your current medications, so please continue taking them as prescribed.  -GENERAL HEALTH MAINTENANCE: For your general health, we will order a mammogram due to your concern about a palpable lump, and a colonoscopy for routine screening. We will also check your blood work today to monitor your overall health.  INSTRUCTIONS:  Please follow up as planned to review the effectiveness of your new medications and your overall health status. Make sure to schedule your mammogram and colonoscopy as  discussed.

## 2023-10-19 ENCOUNTER — Telehealth: Payer: Self-pay

## 2023-10-19 ENCOUNTER — Other Ambulatory Visit: Payer: Self-pay | Admitting: Family Medicine

## 2023-10-19 ENCOUNTER — Encounter: Payer: Self-pay | Admitting: Family Medicine

## 2023-10-19 ENCOUNTER — Other Ambulatory Visit: Payer: Self-pay

## 2023-10-19 ENCOUNTER — Ambulatory Visit
Admission: RE | Admit: 2023-10-19 | Discharge: 2023-10-19 | Discharge status: Home / Self Care | Payer: Medicaid Other | Source: Ambulatory Visit | Attending: Family Medicine | Admitting: Family Medicine

## 2023-10-19 DIAGNOSIS — Z1231 Encounter for screening mammogram for malignant neoplasm of breast: Secondary | ICD-10-CM

## 2023-10-19 DIAGNOSIS — N6310 Unspecified lump in the right breast, unspecified quadrant: Secondary | ICD-10-CM

## 2023-10-19 MED ORDER — METFORMIN HCL 500 MG PO TABS
500.0000 mg | ORAL_TABLET | Freq: Every day | ORAL | 0 refills | Status: DC
  Filled 2023-10-19: qty 30, 30d supply, fill #0

## 2023-10-19 NOTE — Telephone Encounter (Signed)
 Copied from CRM [id number]. Topic: General - Other >> Oct 19, 2023 11:14 AM Haroldine Laws wrote: Reason for CRM: pt called saying she went for her mammogram today after her appt yesterday and they told her she needed an order for lumps in her breast imaging and [place]

## 2023-10-20 ENCOUNTER — Other Ambulatory Visit: Payer: Self-pay | Admitting: Family Medicine

## 2023-10-20 ENCOUNTER — Other Ambulatory Visit: Payer: Self-pay

## 2023-10-20 ENCOUNTER — Encounter: Payer: Self-pay | Admitting: Family Medicine

## 2023-10-20 LAB — CMP14+EGFR
ALT: 14 IU/L (ref 0–32)
AST: 17 IU/L (ref 0–40)
Albumin: 4.2 g/dL (ref 3.9–4.9)
Alkaline Phosphatase: 97 IU/L (ref 44–121)
BUN/Creatinine Ratio: 12 (ref 9–23)
BUN: 11 mg/dL (ref 6–24)
Bilirubin Total: 0.7 mg/dL (ref 0.0–1.2)
CO2: 23 mmol/L (ref 20–29)
Calcium: 9.6 mg/dL (ref 8.7–10.2)
Chloride: 100 mmol/L (ref 96–106)
Creatinine, Ser: 0.92 mg/dL (ref 0.57–1.00)
Globulin, Total: 3 g/dL (ref 1.5–4.5)
Glucose: 105 mg/dL — ABNORMAL HIGH (ref 70–99)
Potassium: 4.2 mmol/L (ref 3.5–5.2)
Sodium: 140 mmol/L (ref 134–144)
Total Protein: 7.2 g/dL (ref 6.0–8.5)
eGFR: 76 mL/min/{1.73_m2} (ref >59)

## 2023-10-20 LAB — MICROALBUMIN / CREATININE URINE RATIO: Creatinine, Urine: 83 mg/dL

## 2023-10-20 LAB — LP+NON-HDL CHOLESTEROL
Cholesterol, Total: 215 mg/dL — ABNORMAL HIGH (ref 100–199)
HDL: 50 mg/dL (ref >39)
LDL Chol Calc (NIH): 147 mg/dL — ABNORMAL HIGH (ref 0–99)
Total Non-HDL-Chol (LDL+VLDL): 165 mg/dL — ABNORMAL HIGH (ref 0–129)
Triglycerides: 99 mg/dL (ref 0–149)
VLDL Cholesterol Cal: 18 mg/dL (ref 5–40)

## 2023-10-20 MED ORDER — ATORVASTATIN CALCIUM 20 MG PO TABS
20.0000 mg | ORAL_TABLET | Freq: Every day | ORAL | 1 refills | Status: DC
  Filled 2023-10-20 – 2023-11-10 (×8): qty 90, 90d supply, fill #0
  Filled 2024-02-12: qty 90, 90d supply, fill #1

## 2023-10-24 NOTE — Telephone Encounter (Signed)
Orders are already in epic.

## 2023-10-31 ENCOUNTER — Other Ambulatory Visit: Payer: Self-pay

## 2023-10-31 DIAGNOSIS — J45909 Unspecified asthma, uncomplicated: Secondary | ICD-10-CM | POA: Diagnosis not present

## 2023-11-01 ENCOUNTER — Ambulatory Visit
Admission: RE | Admit: 2023-11-01 | Discharge: 2023-11-01 | Disposition: A | Discharge status: Home / Self Care | Source: Ambulatory Visit | Attending: Family Medicine | Admitting: Family Medicine

## 2023-11-01 DIAGNOSIS — N6310 Unspecified lump in the right breast, unspecified quadrant: Secondary | ICD-10-CM

## 2023-11-01 DIAGNOSIS — R928 Other abnormal and inconclusive findings on diagnostic imaging of breast: Secondary | ICD-10-CM | POA: Diagnosis not present

## 2023-11-09 ENCOUNTER — Other Ambulatory Visit: Payer: Self-pay

## 2023-11-09 ENCOUNTER — Ambulatory Visit (AMBULATORY_SURGERY_CENTER): Payer: Medicaid Other

## 2023-11-09 ENCOUNTER — Other Ambulatory Visit (HOSPITAL_COMMUNITY): Payer: Self-pay

## 2023-11-09 VITALS — Ht 66.0 in | Wt 255.0 lb

## 2023-11-09 DIAGNOSIS — Z1211 Encounter for screening for malignant neoplasm of colon: Secondary | ICD-10-CM

## 2023-11-09 MED ORDER — SUFLAVE 178.7 G PO SOLR: 1.0000 | ORAL | 0 refills | Status: DC

## 2023-11-09 NOTE — Progress Notes (Signed)

## 2023-11-09 NOTE — Patient Instructions (Signed)
 [facility] has implemented a new process for scheduling procedures.  Please note your arrival time for the [facility] is your appointment time that is shown on your written instructions.  Please do not arrive one hour prior to the time listed in your instructions.  Please ignore any outside notifications to arrive one hour early.  We apologize for any confusion and look forward to seeing you for your procedure.

## 2023-11-10 ENCOUNTER — Other Ambulatory Visit (HOSPITAL_COMMUNITY): Payer: Self-pay

## 2023-11-10 ENCOUNTER — Other Ambulatory Visit: Payer: Self-pay

## 2023-11-10 ENCOUNTER — Telehealth: Payer: Self-pay

## 2023-11-10 NOTE — Telephone Encounter (Signed)
 Pharmacy Patient Advocate Encounter  Received notification from [facility] that Prior Authorization for [facility] has been APPROVED from 11/09/2023 to 11/08/2024   PA #/Case ID/Reference #: [id number]

## 2023-11-13 ENCOUNTER — Other Ambulatory Visit: Payer: Self-pay

## 2023-11-23 ENCOUNTER — Ambulatory Visit (AMBULATORY_SURGERY_CENTER): Payer: Medicaid Other | Admitting: Internal Medicine

## 2023-11-23 ENCOUNTER — Encounter: Payer: Self-pay | Admitting: Internal Medicine

## 2023-11-23 VITALS — BP 134/77 | HR 59 | Temp 97.2°F | Resp 16 | Ht 66.0 in | Wt 255.0 lb

## 2023-11-23 DIAGNOSIS — K5730 Diverticulosis of large intestine without perforation or abscess without bleeding: Secondary | ICD-10-CM

## 2023-11-23 DIAGNOSIS — E669 Obesity, unspecified: Secondary | ICD-10-CM | POA: Diagnosis not present

## 2023-11-23 DIAGNOSIS — I10 Essential (primary) hypertension: Secondary | ICD-10-CM | POA: Diagnosis not present

## 2023-11-23 DIAGNOSIS — K648 Other hemorrhoids: Secondary | ICD-10-CM | POA: Diagnosis not present

## 2023-11-23 DIAGNOSIS — Z1211 Encounter for screening for malignant neoplasm of colon: Secondary | ICD-10-CM

## 2023-11-23 DIAGNOSIS — E119 Type 2 diabetes mellitus without complications: Secondary | ICD-10-CM | POA: Diagnosis not present

## 2023-11-23 MED ORDER — SODIUM CHLORIDE 0.9 % IV SOLN: 500.0000 mL | INTRAVENOUS | Status: DC

## 2023-11-23 NOTE — Patient Instructions (Signed)

## 2023-11-23 NOTE — Op Note (Signed)
 [facility] Patient Name: Angela Burton Procedure Date: 11/23/2023 7:27 AM MRN: [id number] Endoscopist: Wilhemina Bonito. Marina Goodell , MD, [id number] Age: 53 Referring MD:  Date of Birth: [birth year] Gender: Female Account #: [id number] Procedure:                Colonoscopy Indications:              Screening for colorectal malignant neoplasm Medicines:                Monitored Anesthesia Care Procedure:                Pre-Anesthesia Assessment:                           - Prior to the procedure, a History and Physical                            was performed, and patient medications and                            allergies were reviewed. The patient's tolerance of                            previous anesthesia was also reviewed. The risks                            and benefits of the procedure and the sedation                            options and risks were discussed with the patient.                            All questions were answered, and informed consent                            was obtained. Prior Anticoagulants: The patient has                            taken no anticoagulant or antiplatelet agents. ASA                            Grade Assessment: III - A patient with severe                            systemic disease. After reviewing the risks and                            benefits, the patient was deemed in satisfactory                            condition to undergo the procedure.                           After obtaining informed consent, the colonoscope  was passed under direct vision. Throughout the                            procedure, the patient's blood pressure, pulse, and                            oxygen saturations were monitored continuously. The                            Olympus Scope SN [id number] was introduced through the                            anus and advanced to the the cecum, identified by                             appendiceal orifice and ileocecal valve. The                            ileocecal valve, appendiceal orifice, and rectum                            were photographed. The quality of the bowel                            preparation was excellent. The colonoscopy was                            performed without difficulty. The patient tolerated                            the procedure well. The bowel preparation used was                            SUPREP via split dose instruction. Scope In: 8:49:55 AM Scope Out: 9:01:38 AM Scope Withdrawal Time: 0 hours 9 minutes 13 seconds  Total Procedure Duration: 0 hours 11 minutes 43 seconds  Findings:                 Multiple diverticula were found in the entire colon.                           Internal hemorrhoids were found during                            retroflexion. The hemorrhoids were small.                           The exam was otherwise without abnormality on                            direct and retroflexion views. Complications:            No immediate complications. Estimated blood loss:  None. Estimated Blood Loss:     Estimated blood loss: none. Impression:               - Diverticulosis in the entire examined colon.                           - Internal hemorrhoids.                           - The examination was otherwise normal on direct                            and retroflexion views.                           - No specimens collected. Recommendation:           - Repeat colonoscopy in 10 years for screening                            purposes.                           - Patient has a contact number available for                            emergencies. The signs and symptoms of potential                            delayed complications were discussed with the                            patient. Return to normal activities tomorrow.                            Written discharge instructions were  provided to the                            patient.                           - Resume previous diet.                           - Continue present medications. Wilhemina Bonito. Marina Goodell, MD 11/23/2023 9:18:18 AM This report has been signed electronically.

## 2023-11-23 NOTE — Progress Notes (Signed)
 HISTORY OF PRESENT ILLNESS:  Angela Burton is a 53 y.o. female sent for routine index screening colonoscopy.  No complaints  REVIEW OF SYSTEMS:  All non-GI ROS negative except for  Past Medical History:  Diagnosis Date   Allergy    Asthma    albuterol   Complication of anesthesia    Patient states she had to be revived after her tubes were tied years ago   Diabetes mellitus without complication (HCC)    Diagnosed years ago; no meds in 10 years after losing weight   Fibroid, uterine 03/24/2020   Hypertension    Hyperthyroidism    Irregular menses 06/22/2020   Obesity     Past Surgical History:  Procedure Laterality Date   DILATION AND CURETTAGE OF UTERUS     HYSTEROSCOPY WITH D & C N/A 05/05/2020   Procedure: DILATATION AND CURETTAGE /HYSTEROSCOPY AND IUD REMOVAL;  Surgeon: Angela Fillers, MD;  Location: [facility];  Service: Gynecology;  Laterality: N/A;   LAPAROSCOPIC OVARIAN CYSTECTOMY Right 05/05/2020   Procedure: OPERATIVE LAPAROSCOPY AND LAPAROSCOPIC OVARIAN CYSTECTOMY;  Surgeon: Angela Fillers, MD;  Location: [facility];  Service: Gynecology;  Laterality: Right;   TUBAL LIGATION     VAGINAL HYSTERECTOMY Bilateral 05/31/2022   Procedure: HYSTERECTOMY VAGINAL WITH SALPINGECTOMY;  Surgeon: Angela Fillers, MD;  Location: [facility] OR;  Service: Gynecology;  Laterality: Bilateral;    Social History Angela Burton  reports that she quit smoking about 15 months ago. Her smoking use included cigarettes. She has never used smokeless tobacco. She reports current alcohol use of about 2.0 standard drinks of alcohol per week. She reports current drug use. Drug: Marijuana.  family history includes Breast cancer in her sister; Diabetes in her father; HIV in her sister; Heart attack in her mother; High Cholesterol in her father; Hypertension in her father.  Allergies  Allergen Reactions   Erythromycin Other (See Comments)    Had a seizure.    Sulfa  Antibiotics Anaphylaxis, Hives, Swelling and Rash   Aspirin Hives    Able to take ibuprofen       PHYSICAL EXAMINATION: Vital signs: BP (!) 132/94   Pulse 83   Temp (!) 97.2 F (36.2 C)   Resp 20   Ht 5\' 6"  (1.676 m)   Wt 255 lb (115.7 kg)   LMP  (LMP Unknown)   SpO2 99%   BMI 41.16 kg/m  General: Well-developed, well-nourished, no acute distress HEENT: Sclerae are anicteric, conjunctiva pink. Oral mucosa intact Lungs: Clear Heart: Regular Abdomen: soft, nontender, nondistended, no obvious ascites, no peritoneal signs, normal bowel sounds. No organomegaly. Extremities: No edema Psychiatric: alert and oriented x3. Cooperative     ASSESSMENT:  Colon cancer screening   PLAN:  Screening colonoscopy

## 2023-11-23 NOTE — Progress Notes (Signed)
 Pt sedate, gd SR's, VSS, report to RN

## 2023-11-23 NOTE — Progress Notes (Signed)
 Pt's states no medical or surgical changes since previsit or office visit.

## 2023-11-24 ENCOUNTER — Telehealth: Payer: Self-pay | Admitting: *Deleted

## 2023-11-24 NOTE — Telephone Encounter (Signed)
  Follow up Call-     11/23/2023    7:21 AM  Call back number  Permission to leave phone message Yes     Patient questions:  Do you have a fever, pain , or abdominal swelling? No. Pain Score  0 *  Have you tolerated food without any problems? Yes.    Have you been able to return to your normal activities? Yes.    Do you have any questions about your discharge instructions: Diet   No. Medications  No. Follow up visit  No.  Do you have questions or concerns about your Care? No.  Actions: * If pain score is 4 or above: No action needed, pain <4.

## 2023-12-18 ENCOUNTER — Other Ambulatory Visit: Payer: Self-pay

## 2023-12-22 ENCOUNTER — Ambulatory Visit: Payer: Self-pay

## 2023-12-22 ENCOUNTER — Other Ambulatory Visit: Payer: Self-pay

## 2023-12-22 NOTE — Telephone Encounter (Signed)
 Pt advised okay to take next Ozempic  injection today or Monday, advised to choose a day that she will be able to remain consistent with moving forward. Pt verbalized understanding and agrees to plan.   Copied from CRM [id number]. Topic: Clinical - Medication Question >> Dec 22, 2023  3:07 PM Hamp Levine R wrote: Reason for CRM: Patient calling stating last week she was out of town and didn't take her Ozempic  shot on Friday, she waited and took it on Monday. She is inquiring if she should still take the shot today or wait and take it on Monday.  Patient can be reached at [phone] Reason for Disposition  Caller has medicine question only, adult not sick, AND triager answers question  Answer Assessment - Initial Assessment Questions 1. NAME of MEDICINE: "What medicine(s) are you calling about?"     Ozempic  2. QUESTION: "What is your question?" (e.g., double dose of medicine, side effect)     Ozempic  dosing 3. PRESCRIBER: "Who prescribed the medicine?" Reason: if prescribed by specialist, call should be referred to that group.     PCP 4. SYMPTOMS: "Do you have any symptoms?" If Yes, ask: "What symptoms are you having?"  "How bad are the symptoms (e.g., mild, moderate, severe)     None  Protocols used: Medication Question Call-A-AH

## 2024-01-14 ENCOUNTER — Other Ambulatory Visit: Payer: Self-pay

## 2024-01-16 ENCOUNTER — Ambulatory Visit: Payer: Medicaid Other | Admitting: Family Medicine

## 2024-01-18 ENCOUNTER — Other Ambulatory Visit: Payer: Self-pay

## 2024-02-12 ENCOUNTER — Other Ambulatory Visit: Payer: Self-pay

## 2024-02-13 ENCOUNTER — Encounter (INDEPENDENT_AMBULATORY_CARE_PROVIDER_SITE_OTHER): Payer: Self-pay

## 2024-02-13 ENCOUNTER — Other Ambulatory Visit: Payer: Self-pay

## 2024-02-26 ENCOUNTER — Ambulatory Visit: Attending: Nurse Practitioner | Admitting: Nurse Practitioner

## 2024-02-26 ENCOUNTER — Encounter: Payer: Self-pay | Admitting: Nurse Practitioner

## 2024-02-26 VITALS — BP 133/87 | HR 82 | Resp 19 | Ht 66.0 in | Wt 231.6 lb

## 2024-02-26 DIAGNOSIS — D649 Anemia, unspecified: Secondary | ICD-10-CM | POA: Diagnosis not present

## 2024-02-26 DIAGNOSIS — I152 Hypertension secondary to endocrine disorders: Secondary | ICD-10-CM | POA: Diagnosis not present

## 2024-02-26 DIAGNOSIS — E1159 Type 2 diabetes mellitus with other circulatory complications: Secondary | ICD-10-CM | POA: Diagnosis not present

## 2024-02-26 DIAGNOSIS — E7800 Pure hypercholesterolemia, unspecified: Secondary | ICD-10-CM

## 2024-02-26 DIAGNOSIS — E119 Type 2 diabetes mellitus without complications: Secondary | ICD-10-CM | POA: Diagnosis not present

## 2024-02-26 DIAGNOSIS — Z7985 Long-term (current) use of injectable non-insulin antidiabetic drugs: Secondary | ICD-10-CM | POA: Diagnosis not present

## 2024-02-26 NOTE — Patient Instructions (Signed)
 VISIT SUMMARY:  Today, we discussed your ongoing treatment with semaglutide  for type 2 diabetes and reviewed your progress. You have successfully lost 24 pounds over the past 3 months:). We also talked about your blood pressure management and general health maintenance.  YOUR PLAN:  -TYPE 2 DIABETES MELLITUS: Type 2 diabetes is a condition where your body does not use insulin properly, leading to high blood sugar levels. You are currently on semaglutide  (Ozempic ) 0.5 mg weekly, which has helped you lose weight and manage your blood sugar. Continue taking this medication and monitor your blood sugar 1-2 hours after meals. We will also check your A1c levels with today's blood work. Increasing your physical activity is encouraged.  -HYPERTENSION: Hypertension, or high blood pressure, is when the force of your blood against your artery walls is too high. Your blood pressure has improved to the 120s/80s at home, although today's reading was higher likely due to work fatigue. Continue with your current blood pressure management and keep monitoring your blood pressure at home.  -GENERAL HEALTH MAINTENANCE: You are up to date with your mammogram, colonoscopy, and eye exam. We recommend getting the shingles vaccine since you had a previous episode.   INSTRUCTIONS:  Please schedule a follow-up appointment with Dr. Newlin in three months. Continue monitoring your blood sugar and blood pressure at home, and increase your physical activity as planned.

## 2024-02-26 NOTE — Progress Notes (Signed)
 Assessment and Plan  Type 2 Diabetes Mellitus On semaglutide  with significant weight loss. Nausea managed by listening to body signals. Blood sugar monitoring adjusted for accuracy. A1c re-evaluation pending. Discussed possible side effects of dose increase; decided to maintain current dose. - Continue semaglutide  (Ozempic ) 0.5 mg weekly. - Check blood sugar 1-2 hours postprandial. - Order A1c test with today's blood work. - Encourage increased physical activity.  Hypertension Blood pressure 120s/80s at home. Today's higher reading likely due to work fatigue. - Continue current blood pressure management. - Monitor blood pressure at home.  General Health Maintenance Up to date with mammogram, colonoscopy, and eye exam. Recommended shingles vaccine due to previous episode.  Patient has been counseled on age-appropriate routine health concerns for screening and prevention. These are reviewed and up-to-date. Referrals have been placed accordingly. Immunizations are up-to-date or declined.    Subjective:   Chief Complaint  Patient presents with   Hypertension    Angela Burton 53 y.o. female presents to office today for follow up to HTN.   She is a patient of Dr. Newlin.  She has a past medical history of Allergy, Asthma, Diabetes mellitus without complication, Fibroid, uterine (03/24/2020), Hypertension, Hyperthyroidism, Irregular menses (06/22/2020), and Obesity.   Blood pressure is well-controlled today.  Currently taking hydrochlorothiazide  25 mg daily and valsartan  40 mg daily.  She does monitor her blood pressure at home BP Readings from Last 3 Encounters:  02/26/24 133/87  11/23/23 134/77  10/18/23 133/83     DM2 A1c at goal of less than 7.  Currently on GLP-1 Lab Results  Component Value Date   HGBA1C 6.5 10/18/2023     LDL not at goal.  May need to increase statin dosage at next visit. Lab Results  Component Value Date   LDLCALC 147 (H) 10/18/2023     Review  of Systems  Constitutional:  Negative for fever, malaise/fatigue and weight loss.  HENT: Negative.  Negative for nosebleeds.   Eyes: Negative.  Negative for blurred vision, double vision and photophobia.  Respiratory: Negative.  Negative for cough and shortness of breath.   Cardiovascular: Negative.  Negative for chest pain, palpitations and leg swelling.  Gastrointestinal: Negative.  Negative for heartburn, nausea and vomiting.  Musculoskeletal: Negative.  Negative for myalgias.  Neurological: Negative.  Negative for dizziness, focal weakness, seizures and headaches.  Psychiatric/Behavioral: Negative.  Negative for suicidal ideas.     Past Medical History:  Diagnosis Date   Allergy    Asthma    albuterol    Complication of anesthesia    Patient states she had to be revived after her tubes were tied years ago   Diabetes mellitus without complication (HCC)    Diagnosed years ago; no meds in 10 years after losing weight   Fibroid, uterine 03/24/2020   Hypertension    Hyperthyroidism    Irregular menses 06/22/2020   Obesity     Past Surgical History:  Procedure Laterality Date   DILATION AND CURETTAGE OF UTERUS     HYSTEROSCOPY WITH D & C N/A 05/05/2020   Procedure: DILATATION AND CURETTAGE /HYSTEROSCOPY AND IUD REMOVAL;  Surgeon: Zina Jerilynn LABOR, MD;  Location: [facility];  Service: Gynecology;  Laterality: N/A;   LAPAROSCOPIC OVARIAN CYSTECTOMY Right 05/05/2020   Procedure: OPERATIVE LAPAROSCOPY AND LAPAROSCOPIC OVARIAN CYSTECTOMY;  Surgeon: Zina Jerilynn LABOR, MD;  Location: [facility];  Service: Gynecology;  Laterality: Right;   TUBAL LIGATION     VAGINAL HYSTERECTOMY Bilateral 05/31/2022   Procedure:  HYSTERECTOMY VAGINAL WITH SALPINGECTOMY;  Surgeon: Zina Jerilynn LABOR, MD;  Location: [facility] OR;  Service: Gynecology;  Laterality: Bilateral;    Family History  Problem Relation Age of Onset   Heart attack Mother    Diabetes Father    Hypertension Father     High Cholesterol Father    Breast cancer Sister    HIV Sister    Colon cancer Neg Hx    Rectal cancer Neg Hx    Stomach cancer Neg Hx     Social History Reviewed with no changes to be made today.   Outpatient Medications Prior to Visit  Medication Sig Dispense Refill   Accu-Chek Softclix Lancets lancets Use to check blood sugar 3 times daily. 100 each 6   albuterol  (PROVENTIL ) (2.5 MG/3ML) 0.083% nebulizer solution USE 1 VIAL VIA NEBULIZER EVERY 6 HOURS AS NEEDED FOR WHEEZING OR SHORTNESS OF BREATH 180 mL 0   albuterol  (VENTOLIN  HFA) 108 (90 Base) MCG/ACT inhaler Inhale 1-2 puffs into the lungs every 6 (six) hours as needed for wheezing or shortness of breath. 18 g 1   atorvastatin  (LIPITOR) 20 MG tablet Take 1 tablet (20 mg total) by mouth daily. 90 tablet 1   Blood Glucose Monitoring Suppl (ACCU-CHEK GUIDE) w/Device KIT Test 3 (three) times daily. 1 kit 0   budesonide -formoterol  (SYMBICORT ) 80-4.5 MCG/ACT inhaler Inhale 2 puffs into the lungs 2 (two) times daily. 10.2 g 3   cetirizine  (ZYRTEC ) 10 MG tablet Take 1 tablet (10 mg total) by mouth daily. 60 tablet 11   glucose blood (ACCU-CHEK GUIDE TEST) test strip Use as instructed three times daily 100 strip 12   hydrochlorothiazide  (HYDRODIURIL ) 25 MG tablet Take 1 tablet (25 mg total) by mouth daily. 90 tablet 1   hydrOXYzine  (ATARAX ) 25 MG tablet Take 1 tablet (25 mg total) by mouth 3 (three) times daily as needed for anxiety. 60 tablet 1   Misc. Devices MISC Nebulizer device. Diagnosis, Asthma 1 each 0   Multiple Vitamins-Minerals (MULTIVITAMIN WITH MINERALS) tablet Take 1 tablet by mouth daily.     omeprazole  (PRILOSEC) 40 MG capsule Take 1 capsule (40 mg total) by mouth daily. 90 capsule 1   Semaglutide ,0.25 or 0.5MG /DOS, (OZEMPIC , 0.25 OR 0.5 MG/DOSE,) 2 MG/3ML SOPN Inject 0.5 mg into the skin once a week. 3 mL 5   valsartan  (DIOVAN ) 40 MG tablet Take 1 tablet (40 mg total) by mouth daily. 90 tablet 1   benzonatate  (TESSALON )  100 MG capsule Take 1 capsule (100 mg total) by mouth every 8 (eight) hours. (Patient not taking: Reported on 02/26/2024) 21 capsule 0   No facility-administered medications prior to visit.    Allergies  Allergen Reactions   Erythromycin Other (See Comments)    Had a seizure.    Sulfa Antibiotics Anaphylaxis, Hives, Swelling and Rash   Aspirin Hives    Able to take ibuprofen        Objective:    BP 133/87 (BP Location: Left Arm, Patient Position: Sitting, Cuff Size: Large)   Pulse 82   Resp 19   Ht 5' 6 (1.676 m)   Wt 231 lb 9.6 oz (105.1 kg)   LMP  (LMP Unknown)   SpO2 100%   BMI 37.38 kg/m  Wt Readings from Last 3 Encounters:  02/26/24 231 lb 9.6 oz (105.1 kg)  11/23/23 255 lb (115.7 kg)  11/09/23 255 lb (115.7 kg)    Physical Exam Vitals and nursing note reviewed.  Constitutional:      Appearance:  She is well-developed.  HENT:     Head: Normocephalic and atraumatic.  Cardiovascular:     Rate and Rhythm: Normal rate and regular rhythm.     Heart sounds: Normal heart sounds. No murmur heard.    No friction rub. No gallop.  Pulmonary:     Effort: Pulmonary effort is normal. No tachypnea or respiratory distress.     Breath sounds: Normal breath sounds. No decreased breath sounds, wheezing, rhonchi or rales.  Chest:     Chest wall: No tenderness.  Abdominal:     General: Bowel sounds are normal.     Palpations: Abdomen is soft.  Musculoskeletal:        General: Normal range of motion.     Cervical back: Normal range of motion.  Skin:    General: Skin is warm and dry.  Neurological:     Mental Status: She is alert and oriented to person, place, and time.     Coordination: Coordination normal.  Psychiatric:        Behavior: Behavior normal. Behavior is cooperative.        Thought Content: Thought content normal.        Judgment: Judgment normal.          Patient has been counseled extensively about nutrition and exercise as well as the importance of  adherence with medications and regular follow-up. The patient was given clear instructions to go to ER or return to medical center if symptoms don't improve, worsen or new problems develop. The patient verbalized understanding.   Follow-up: Return in about 14 weeks (around 06/03/2024).    Discussed the use of AI scribe software for clinical note transcription with the patient, who gave verbal consent to proceed.     Haze LELON Servant, FNP-BC [facility] [place], [place] [phone]   02/26/2024, 4:18 PM

## 2024-02-27 ENCOUNTER — Ambulatory Visit: Payer: Self-pay | Admitting: Nurse Practitioner

## 2024-02-27 LAB — CBC WITH DIFFERENTIAL/PLATELET
Basophils Absolute: 0.1 x10E3/uL (ref 0.0–0.2)
Basos: 1 %
EOS (ABSOLUTE): 0.1 x10E3/uL (ref 0.0–0.4)
Eos: 2 %
Hematocrit: 46.7 % — ABNORMAL HIGH (ref 34.0–46.6)
Hemoglobin: 15.1 g/dL (ref 11.1–15.9)
Immature Grans (Abs): 0 x10E3/uL (ref 0.0–0.1)
Immature Granulocytes: 0 %
Lymphocytes Absolute: 4.1 x10E3/uL — ABNORMAL HIGH (ref 0.7–3.1)
Lymphs: 54 %
MCH: 28.4 pg (ref 26.6–33.0)
MCHC: 32.3 g/dL (ref 31.5–35.7)
MCV: 88 fL (ref 79–97)
Monocytes Absolute: 0.5 x10E3/uL (ref 0.1–0.9)
Monocytes: 7 %
Neutrophils Absolute: 2.7 x10E3/uL (ref 1.4–7.0)
Neutrophils: 36 %
Platelets: 319 x10E3/uL (ref 150–450)
RBC: 5.32 x10E6/uL — ABNORMAL HIGH (ref 3.77–5.28)
RDW: 13.8 % (ref 11.7–15.4)
WBC: 7.6 x10E3/uL (ref 3.4–10.8)

## 2024-02-27 LAB — HEMOGLOBIN A1C
Est. average glucose Bld gHb Est-mCnc: 114 mg/dL
Hgb A1c MFr Bld: 5.6 % (ref 4.8–5.6)

## 2024-02-27 LAB — LIPID PANEL
Chol/HDL Ratio: 3.2 ratio (ref 0.0–4.4)
Cholesterol, Total: 157 mg/dL (ref 100–199)
HDL: 49 mg/dL (ref >39)
LDL Chol Calc (NIH): 90 mg/dL (ref 0–99)
Triglycerides: 96 mg/dL (ref 0–149)
VLDL Cholesterol Cal: 18 mg/dL (ref 5–40)

## 2024-03-12 ENCOUNTER — Telehealth: Payer: Self-pay | Admitting: Family Medicine

## 2024-03-12 ENCOUNTER — Other Ambulatory Visit: Payer: Self-pay

## 2024-03-12 MED ORDER — SEMAGLUTIDE (1 MG/DOSE) 4 MG/3ML ~~LOC~~ SOPN
1.0000 mg | PEN_INJECTOR | SUBCUTANEOUS | 3 refills | Status: DC
  Filled 2024-03-12: qty 3, 28d supply, fill #0
  Filled 2024-04-11: qty 3, 28d supply, fill #1
  Filled 2024-05-10: qty 3, 28d supply, fill #2
  Filled 2024-06-07: qty 3, 28d supply, fill #3

## 2024-03-12 NOTE — Telephone Encounter (Signed)
 Ozempic  1 mg has been sent to the pharmacy

## 2024-03-12 NOTE — Telephone Encounter (Signed)
 Copied from CRM [id number]. Topic: Clinical - Medication Question >> Mar 12, 2024 11:13 AM Nathanel BROCKS wrote:  Reason for CRM: pt called and stated that she is now ready to up the dose to 1.0 on the ozempic . She is also ready for a refill but I wanted to confirm that she is ready to up the dose. Please advise.

## 2024-03-12 NOTE — Telephone Encounter (Signed)
 Routing to PCP for review.

## 2024-03-12 NOTE — Addendum Note (Signed)
 Addended by: Aylissa Heinemann on: 03/12/2024 05:41 PM   Modules accepted: Orders

## 2024-03-13 ENCOUNTER — Other Ambulatory Visit: Payer: Self-pay | Admitting: Family Medicine

## 2024-03-13 ENCOUNTER — Other Ambulatory Visit: Payer: Self-pay

## 2024-03-13 MED ORDER — ATORVASTATIN CALCIUM 20 MG PO TABS
20.0000 mg | ORAL_TABLET | Freq: Every day | ORAL | 1 refills | Status: DC
  Filled 2024-03-13 – 2024-05-10 (×2): qty 90, 90d supply, fill #0

## 2024-03-19 ENCOUNTER — Other Ambulatory Visit: Payer: Self-pay

## 2024-04-03 ENCOUNTER — Ambulatory Visit

## 2024-04-12 ENCOUNTER — Other Ambulatory Visit: Payer: Self-pay

## 2024-04-24 ENCOUNTER — Ambulatory Visit

## 2024-05-07 ENCOUNTER — Ambulatory Visit: Admitting: Family Medicine

## 2024-05-10 ENCOUNTER — Encounter: Payer: Self-pay | Admitting: Family Medicine

## 2024-05-10 ENCOUNTER — Other Ambulatory Visit: Payer: Self-pay

## 2024-05-13 ENCOUNTER — Other Ambulatory Visit: Payer: Self-pay

## 2024-06-03 ENCOUNTER — Ambulatory Visit: Admitting: Family Medicine

## 2024-06-04 ENCOUNTER — Ambulatory Visit: Admitting: Family Medicine

## 2024-06-07 ENCOUNTER — Other Ambulatory Visit: Payer: Self-pay | Admitting: Critical Care Medicine

## 2024-06-07 ENCOUNTER — Other Ambulatory Visit: Payer: Self-pay

## 2024-06-07 DIAGNOSIS — T801XXD Vascular complications following infusion, transfusion and therapeutic injection, subsequent encounter: Secondary | ICD-10-CM

## 2024-06-07 MED ORDER — CETIRIZINE HCL 10 MG PO TABS
10.0000 mg | ORAL_TABLET | Freq: Every day | ORAL | 11 refills | Status: DC
  Filled 2024-06-07 – 2024-07-16 (×2): qty 60, 60d supply, fill #0

## 2024-06-12 ENCOUNTER — Ambulatory Visit: Admitting: Family Medicine

## 2024-06-18 ENCOUNTER — Other Ambulatory Visit: Payer: Self-pay

## 2024-07-05 ENCOUNTER — Other Ambulatory Visit: Payer: Self-pay

## 2024-07-05 ENCOUNTER — Other Ambulatory Visit: Payer: Self-pay | Admitting: Family Medicine

## 2024-07-05 MED ORDER — OZEMPIC (1 MG/DOSE) 4 MG/3ML ~~LOC~~ SOPN
1.0000 mg | PEN_INJECTOR | SUBCUTANEOUS | 0 refills | Status: DC
  Filled 2024-07-05: qty 3, 28d supply, fill #0

## 2024-07-16 ENCOUNTER — Other Ambulatory Visit: Payer: Self-pay

## 2024-07-16 ENCOUNTER — Other Ambulatory Visit: Payer: Self-pay | Admitting: Family Medicine

## 2024-07-16 DIAGNOSIS — I152 Hypertension secondary to endocrine disorders: Secondary | ICD-10-CM

## 2024-07-16 DIAGNOSIS — K219 Gastro-esophageal reflux disease without esophagitis: Secondary | ICD-10-CM

## 2024-07-16 MED ORDER — VALSARTAN 40 MG PO TABS
40.0000 mg | ORAL_TABLET | Freq: Every day | ORAL | 0 refills | Status: DC
  Filled 2024-07-16: qty 30, 30d supply, fill #0

## 2024-07-16 MED ORDER — HYDROCHLOROTHIAZIDE 25 MG PO TABS
25.0000 mg | ORAL_TABLET | Freq: Every day | ORAL | 0 refills | Status: DC
  Filled 2024-07-16: qty 30, 30d supply, fill #0

## 2024-07-16 MED ORDER — OMEPRAZOLE 40 MG PO CPDR
40.0000 mg | DELAYED_RELEASE_CAPSULE | Freq: Every day | ORAL | 0 refills | Status: DC
  Filled 2024-07-16: qty 30, 30d supply, fill #0

## 2024-07-17 ENCOUNTER — Other Ambulatory Visit: Payer: Self-pay

## 2024-07-19 ENCOUNTER — Other Ambulatory Visit: Payer: Self-pay

## 2024-07-22 ENCOUNTER — Other Ambulatory Visit: Payer: Self-pay

## 2024-07-22 ENCOUNTER — Encounter: Payer: Self-pay | Admitting: Family Medicine

## 2024-07-22 ENCOUNTER — Ambulatory Visit: Attending: Family Medicine | Admitting: Family Medicine

## 2024-07-22 VITALS — BP 102/68 | HR 69 | Temp 98.1°F | Ht 66.0 in | Wt 225.4 lb

## 2024-07-22 DIAGNOSIS — E119 Type 2 diabetes mellitus without complications: Secondary | ICD-10-CM | POA: Diagnosis not present

## 2024-07-22 DIAGNOSIS — I8393 Asymptomatic varicose veins of bilateral lower extremities: Secondary | ICD-10-CM

## 2024-07-22 DIAGNOSIS — I152 Hypertension secondary to endocrine disorders: Secondary | ICD-10-CM

## 2024-07-22 DIAGNOSIS — J4520 Mild intermittent asthma, uncomplicated: Secondary | ICD-10-CM

## 2024-07-22 DIAGNOSIS — K219 Gastro-esophageal reflux disease without esophagitis: Secondary | ICD-10-CM

## 2024-07-22 DIAGNOSIS — E1159 Type 2 diabetes mellitus with other circulatory complications: Secondary | ICD-10-CM | POA: Diagnosis not present

## 2024-07-22 DIAGNOSIS — Z7985 Long-term (current) use of injectable non-insulin antidiabetic drugs: Secondary | ICD-10-CM

## 2024-07-22 DIAGNOSIS — R059 Cough, unspecified: Secondary | ICD-10-CM | POA: Diagnosis not present

## 2024-07-22 MED ORDER — PREDNISONE 20 MG PO TABS
20.0000 mg | ORAL_TABLET | Freq: Every day | ORAL | 0 refills | Status: AC
  Filled 2024-07-22: qty 5, 5d supply, fill #0

## 2024-07-22 MED ORDER — OZEMPIC (1 MG/DOSE) 4 MG/3ML ~~LOC~~ SOPN
1.0000 mg | PEN_INJECTOR | SUBCUTANEOUS | 1 refills | Status: AC
  Filled 2024-07-22 – 2024-08-14 (×2): qty 9, 84d supply, fill #0

## 2024-07-22 MED ORDER — BUDESONIDE-FORMOTEROL FUMARATE 80-4.5 MCG/ACT IN AERO
2.0000 | INHALATION_SPRAY | Freq: Two times a day (BID) | RESPIRATORY_TRACT | 6 refills | Status: AC
  Filled 2024-07-22: qty 10.2, 30d supply, fill #0

## 2024-07-22 MED ORDER — VALSARTAN 40 MG PO TABS
40.0000 mg | ORAL_TABLET | Freq: Every day | ORAL | 1 refills | Status: AC
  Filled 2024-07-22 – 2024-08-29 (×2): qty 90, 90d supply, fill #0

## 2024-07-22 MED ORDER — ALBUTEROL SULFATE HFA 108 (90 BASE) MCG/ACT IN AERS
1.0000 | INHALATION_SPRAY | Freq: Four times a day (QID) | RESPIRATORY_TRACT | 1 refills | Status: AC | PRN
  Filled 2024-07-22: qty 6.7, 25d supply, fill #0

## 2024-07-22 MED ORDER — ATORVASTATIN CALCIUM 20 MG PO TABS
20.0000 mg | ORAL_TABLET | Freq: Every day | ORAL | 1 refills | Status: AC
  Filled 2024-07-22 – 2024-08-29 (×2): qty 90, 90d supply, fill #0

## 2024-07-22 MED ORDER — HYDROCHLOROTHIAZIDE 25 MG PO TABS
25.0000 mg | ORAL_TABLET | Freq: Every day | ORAL | 1 refills | Status: AC
  Filled 2024-07-22 – 2024-08-29 (×2): qty 90, 90d supply, fill #0

## 2024-07-22 MED ORDER — BENZONATATE 100 MG PO CAPS
100.0000 mg | ORAL_CAPSULE | Freq: Three times a day (TID) | ORAL | 0 refills | Status: AC | PRN
  Filled 2024-07-22: qty 15, 5d supply, fill #0

## 2024-07-22 MED ORDER — OMEPRAZOLE 40 MG PO CPDR
40.0000 mg | DELAYED_RELEASE_CAPSULE | Freq: Every day | ORAL | 1 refills | Status: AC
  Filled 2024-07-22 – 2024-08-29 (×2): qty 90, 90d supply, fill #0

## 2024-07-22 MED ORDER — CETIRIZINE HCL 10 MG PO TABS
10.0000 mg | ORAL_TABLET | Freq: Every day | ORAL | 11 refills | Status: AC
  Filled 2024-07-22: qty 60, 60d supply, fill #0

## 2024-07-22 NOTE — Patient Instructions (Signed)
 VISIT SUMMARY:  During your visit, we discussed your persistent cough, asthma, diabetes, hypertension, acid reflux, and varicose veins. We reviewed your symptoms, current medications, and made some adjustments to your treatment plan to help manage your conditions more effectively.  YOUR PLAN:  -ASTHMA WITH ACUTE COUGH: Your persistent cough is likely due to a viral infection from your recent travel. We have prescribed prednisone  20 mg for 5 days and Tessalon  for your cough. Please continue using your Symbicort  and albuterol  inhaler as needed.  -TYPE 2 DIABETES MELLITUS: Your diabetes is well-controlled with an A1c of 5.6. The weight gain you experienced was likely due to missing your Ozempic  doses during travel. Please continue taking Ozempic  1 mg. We have also ordered a comprehensive metabolic panel and recommend an annual eye exam to monitor your condition.  -HYPERTENSION: Your blood pressure is well-controlled with your current medications. Please continue taking your antihypertensive medications as prescribed.  -GASTROESOPHAGEAL REFLUX DISEASE: Your acid reflux is being managed well with omeprazole . Please continue taking omeprazole  40 mg daily.  -VARICOSE VEINS OF LOWER EXTREMITY: Your varicose veins are causing discomfort, especially with prolonged standing and weight changes. Please continue wearing compression stockings and elevating your legs when possible. We discussed the option of vein stripping if your symptoms worsen.  INSTRUCTIONS:  Please follow up with a comprehensive metabolic panel and schedule your annual eye exam. Continue with your current medications and the new prescriptions provided. If your symptoms worsen or you have any concerns, please contact our office.

## 2024-07-22 NOTE — Progress Notes (Signed)
 Subjective:  Patient ID: Angela Burton, female    DOB: 1971/07/28  Age: 53 y.o. MRN: [id number]  CC: Medical Management of Chronic Issues (Varicose veins/Cough)     Discussed the use of AI scribe software for clinical note transcription with the patient, who gave verbal consent to proceed.  History of Present Illness Angela Burton is a 53 year old female with a history of hypertension, acid reflux, and prediabetes who presents with a persistent cough.  She has had a persistent cough since July 14, 2024, starting as a dry cough after returning from a cruise and later becoming productive with phlegm. Her brother-in-law tested positive for COVID-19, but her own tests were negative. The cough is worse at night and in the morning and is accompanied by wheezing. She denies shortness of breath, post-nasal drip, fever, chills, body aches, or facial pain. She has used Nyquil and a course of leftover prednisone .  Her asthma has worsened since returning from the cruise. She has increased use of Symbicort  and albuterol  and used her nebulizer during the first four days back.  She has diabetes with a last A1c of 5.6 and takes Ozempic  1 mg. She missed Ozempic  for two weeks during vacation and noticed increased appetite and weight gain. She also takes omeprazole  for acid reflux and hydroxyzine  for anxiety and uses over-the-counter allergy medication.  She has painful varicose veins that are more noticeable with weight loss and prolonged standing at work. She wears compression stockings and gets relief with leg elevation. She describes the leg pain as a toothache sensation.    Past Medical History:  Diagnosis Date   Allergy    Asthma    albuterol    Complication of anesthesia    Patient states she had to be revived after her tubes were tied years ago   Diabetes mellitus without complication (HCC)    Diagnosed years ago; no meds in 10 years after losing weight   Fibroid, uterine 03/24/2020    Hypertension    Hyperthyroidism    Irregular menses 06/22/2020   Obesity     Past Surgical History:  Procedure Laterality Date   DILATION AND CURETTAGE OF UTERUS     HYSTEROSCOPY WITH D & C N/A 05/05/2020   Procedure: DILATATION AND CURETTAGE /HYSTEROSCOPY AND IUD REMOVAL;  Surgeon: Zina Jerilynn LABOR, MD;  Location: [facility];  Service: Gynecology;  Laterality: N/A;   LAPAROSCOPIC OVARIAN CYSTECTOMY Right 05/05/2020   Procedure: OPERATIVE LAPAROSCOPY AND LAPAROSCOPIC OVARIAN CYSTECTOMY;  Surgeon: Zina Jerilynn LABOR, MD;  Location: [facility];  Service: Gynecology;  Laterality: Right;   TUBAL LIGATION     VAGINAL HYSTERECTOMY Bilateral 05/31/2022   Procedure: HYSTERECTOMY VAGINAL WITH SALPINGECTOMY;  Surgeon: Zina Jerilynn LABOR, MD;  Location: [facility] OR;  Service: Gynecology;  Laterality: Bilateral;    Family History  Problem Relation Age of Onset   Heart attack Mother    Diabetes Father    Hypertension Father    High Cholesterol Father    Breast cancer Sister    HIV Sister    Colon cancer Neg Hx    Rectal cancer Neg Hx    Stomach cancer Neg Hx     Social History   Socioeconomic History   Marital status: Legally Separated    Spouse name: Not on file   Number of children: Not on file   Years of education: Not on file   Highest education level: 12th grade  Occupational History   Not on file  Tobacco Use   Smoking status: Former    Current packs/day: 0.00    Types: Cigarettes    Quit date: 08/20/2022    Years since quitting: 1.9   Smokeless tobacco: Never   Tobacco comments:    3 cigarettes/2 wks  Vaping Use   Vaping status: Never Used  Substance and Sexual Activity   Alcohol use: Yes    Alcohol/week: 2.0 standard drinks of alcohol    Types: 2 Glasses of wine per week    Comment: rarely   Drug use: Yes    Types: Marijuana    Comment: sometimes    Sexual activity: Yes    Birth control/protection: Surgical    Comment: hysterectomy  Other  Topics Concern   Not on file  Social History Narrative   Not on file   Social Drivers of Health   Financial Resource Strain: High Risk (10/18/2023)   Overall Financial Resource Strain (CARDIA)    Difficulty of Paying Living Expenses: Very hard  Food Insecurity: Food Insecurity Present (10/18/2023)   Hunger Vital Sign    Worried About Running Out of Food in the Last Year: Often true    Ran Out of Food in the Last Year: Often true  Transportation Needs: No Transportation Needs (10/18/2023)   PRAPARE - Administrator, Civil Service (Medical): No    Lack of Transportation (Non-Medical): No  Physical Activity: Insufficiently Active (10/18/2023)   Exercise Vital Sign    Days of Exercise per Week: 1 day    Minutes of Exercise per Session: 30 min  Stress: Stress Concern Present (10/18/2023)   [employer] of Occupational Health - Occupational Stress Questionnaire    Feeling of Stress : Rather much  Social Connections: Socially Isolated (10/18/2023)   Social Connection and Isolation Panel    Frequency of Communication with Friends and Family: Twice a week    Frequency of Social Gatherings with Friends and Family: Once a week    Attends Religious Services: Never    Database Administrator or Organizations: No    Attends Banker Meetings: Never    Marital Status: Divorced    Allergies  Allergen Reactions   Erythromycin Other (See Comments)    Had a seizure.    Sulfa Antibiotics Anaphylaxis, Hives, Swelling and Rash   Aspirin Hives    Able to take ibuprofen     Outpatient Medications Prior to Visit  Medication Sig Dispense Refill   Accu-Chek Softclix Lancets lancets Use to check blood sugar 3 times daily. 100 each 6   albuterol  (PROVENTIL ) (2.5 MG/3ML) 0.083% nebulizer solution USE 1 VIAL VIA NEBULIZER EVERY 6 HOURS AS NEEDED FOR WHEEZING OR SHORTNESS OF BREATH 180 mL 0   Blood Glucose Monitoring Suppl (ACCU-CHEK GUIDE) w/Device KIT Test 3 (three) times daily.  1 kit 0   glucose blood (ACCU-CHEK GUIDE TEST) test strip Use as instructed three times daily 100 strip 12   hydrOXYzine  (ATARAX ) 25 MG tablet Take 1 tablet (25 mg total) by mouth 3 (three) times daily as needed for anxiety. 60 tablet 1   Misc. Devices MISC Nebulizer device. Diagnosis, Asthma 1 each 0   Multiple Vitamins-Minerals (MULTIVITAMIN WITH MINERALS) tablet Take 1 tablet by mouth daily.     albuterol  (VENTOLIN  HFA) 108 (90 Base) MCG/ACT inhaler Inhale 1-2 puffs into the lungs every 6 (six) hours as needed for wheezing or shortness of breath. 18 g 1   atorvastatin  (LIPITOR) 20 MG tablet Take 1 tablet (20 mg  total) by mouth daily. 90 tablet 1   budesonide -formoterol  (SYMBICORT ) 80-4.5 MCG/ACT inhaler Inhale 2 puffs into the lungs 2 (two) times daily. 10.2 g 3   cetirizine  (ZYRTEC ) 10 MG tablet Take 1 tablet (10 mg total) by mouth daily. 60 tablet 11   hydrochlorothiazide  (HYDRODIURIL ) 25 MG tablet Take 1 tablet (25 mg total) by mouth daily. 30 tablet 0   omeprazole  (PRILOSEC) 40 MG capsule Take 1 capsule (40 mg total) by mouth daily. 30 capsule 0   Semaglutide , 1 MG/DOSE, (OZEMPIC , 1 MG/DOSE,) 4 MG/3ML SOPN Inject 1 mg as directed once a week. 3 mL 0   valsartan  (DIOVAN ) 40 MG tablet Take 1 tablet (40 mg total) by mouth daily. 30 tablet 0   No facility-administered medications prior to visit.     ROS Review of Systems  Constitutional:  Negative for activity change and appetite change.  HENT:  Negative for sinus pressure and sore throat.   Respiratory:  Positive for cough. Negative for chest tightness, shortness of breath and wheezing.   Cardiovascular:  Negative for chest pain and palpitations.  Gastrointestinal:  Negative for abdominal distention, abdominal pain and constipation.  Genitourinary: Negative.   Musculoskeletal: Negative.   Psychiatric/Behavioral:  Negative for behavioral problems and dysphoric mood.     Objective:  BP 102/68   Pulse 69   Temp 98.1 F (36.7 C)  (Oral)   Ht 5' 6 (1.676 m)   Wt 225 lb 6.4 oz (102.2 kg)   LMP  (LMP Unknown)   SpO2 97%   BMI 36.38 kg/m      07/22/2024    3:43 PM 02/26/2024    4:02 PM 11/23/2023    9:29 AM  BP/Weight  Systolic BP 102 133 134  Diastolic BP 68 87 77  Wt. (Lbs) 225.4 231.6   BMI 36.38 kg/m2 37.38 kg/m2       Physical Exam Constitutional:      Appearance: She is well-developed.  HENT:     Head:     Comments: No sinus tenderness to palpation    Right Ear: Tympanic membrane normal.     Left Ear: Tympanic membrane normal.     Mouth/Throat:     Pharynx: Oropharynx is clear. No oropharyngeal exudate or posterior oropharyngeal erythema.  Cardiovascular:     Rate and Rhythm: Normal rate.     Heart sounds: Normal heart sounds. No murmur heard. Pulmonary:     Effort: Pulmonary effort is normal.     Breath sounds: Normal breath sounds. No wheezing or rales.  Chest:     Chest wall: No tenderness.  Abdominal:     General: Bowel sounds are normal. There is no distension.     Palpations: Abdomen is soft. There is no mass.     Tenderness: There is no abdominal tenderness.  Musculoskeletal:        General: Normal range of motion.     Right lower leg: No edema.     Left lower leg: No edema.     Comments: Varicose veins in bilateral lower extremities  Neurological:     Mental Status: She is alert and oriented to person, place, and time.  Psychiatric:        Mood and Affect: Mood normal.        Latest Ref Rng & Units 10/18/2023    9:36 AM 10/22/2022   11:23 AM 08/04/2022    8:54 AM  CMP  Glucose 70 - 99 mg/dL 894  95  874  BUN 6 - 24 mg/dL 11  15  8    Creatinine 0.57 - 1.00 mg/dL 9.07  9.06  9.03   Sodium 134 - 144 mmol/L 140  137  141   Potassium 3.5 - 5.2 mmol/L 4.2  3.8  4.3   Chloride 96 - 106 mmol/L 100  99  100   CO2 20 - 29 mmol/L 23  32  25   Calcium  8.7 - 10.2 mg/dL 9.6  9.6  9.6   Total Protein 6.0 - 8.5 g/dL 7.2  7.7    Total Bilirubin 0.0 - 1.2 mg/dL 0.7  0.5    Alkaline  Phos 44 - 121 IU/L 97  80    AST 0 - 40 IU/L 17  11    ALT 0 - 32 IU/L 14  12      Lipid Panel     Component Value Date/Time   CHOL 157 02/26/2024 1626   TRIG 96 02/26/2024 1626   HDL 49 02/26/2024 1626   CHOLHDL 3.2 02/26/2024 1626   LDLCALC 90 02/26/2024 1626    CBC    Component Value Date/Time   WBC 7.6 02/26/2024 1626   WBC 8.9 10/22/2022 1123   RBC 5.32 (H) 02/26/2024 1626   RBC 5.14 (H) 10/22/2022 1123   HGB 15.1 02/26/2024 1626   HCT 46.7 (H) 02/26/2024 1626   PLT 319 02/26/2024 1626   MCV 88 02/26/2024 1626   MCH 28.4 02/26/2024 1626   MCH 23.7 (L) 10/22/2022 1123   MCHC 32.3 02/26/2024 1626   MCHC 31.0 10/22/2022 1123   RDW 13.8 02/26/2024 1626   LYMPHSABS 4.1 (H) 02/26/2024 1626   MONOABS 0.7 10/22/2022 1123   EOSABS 0.1 02/26/2024 1626   BASOSABS 0.1 02/26/2024 1626    Lab Results  Component Value Date   HGBA1C 5.6 02/26/2024    Lab Results  Component Value Date   HGBA1C 5.6 02/26/2024   HGBA1C 6.5 10/18/2023   HGBA1C 6.3 (H) 08/04/2022       Assessment & Plan Asthma with acute cough Acute cough likely due to viral infection from recent travel. Symptoms improving, no antibiotics needed. - Prescribed prednisone  20 mg for 5 days. - Prescribed Tessalon  for cough. - Continue Symbicort  and albuterol  inhaler.  Type 2 diabetes mellitus treated with injection of non-insulin origin Well-controlled with A1c of 5.6. Weight gain due to missed Ozempic  doses during travel. - Continue Ozempic  1 mg. - Ordered comprehensive metabolic panel. - Recommended annual eye exam. -Counseled on Diabetic diet, the healthy plate, 849 minutes of moderate intensity exercise/week Blood sugar logs with fasting goals of 80-120 mg/dl, random of less than 819 and in the event of sugars less than 60 mg/dl or greater than 599 mg/dl encouraged to notify the clinic. Advised on the need for annual eye exams, annual foot exams, Pneumonia vaccine.   Hypertension associated with  type 2 diabetes mellitus Well-controlled with current regimen. - Continue current antihypertensive medications. -Counseled on blood pressure goal of less than 130/80, low-sodium, DASH diet, medication compliance, 150 minutes of moderate intensity exercise per week. Discussed medication compliance, adverse effects.   Gastroesophageal reflux disease Managed with omeprazole , no new symptoms. - Continue omeprazole  40 mg daily. -Advised to avoid recumbency up to 2 hours postmeal, avoid late meals, avoid foods that trigger symptoms.   Varicose veins of lower extremity Symptoms exacerbated by prolonged standing and weight changes. Prefers current management over vein stripping. - Continue compression stockings. - Advised leg elevation. - Discussed vein stripping  if symptoms worsen.      Meds ordered this encounter  Medications   albuterol  (VENTOLIN  HFA) 108 (90 Base) MCG/ACT inhaler    Sig: Inhale 1-2 puffs into the lungs every 6 (six) hours as needed for wheezing or shortness of breath.    Dispense:  6.7 g    Refill:  1   atorvastatin  (LIPITOR) 20 MG tablet    Sig: Take 1 tablet (20 mg total) by mouth daily.    Dispense:  90 tablet    Refill:  1   budesonide -formoterol  (SYMBICORT ) 80-4.5 MCG/ACT inhaler    Sig: Inhale 2 puffs into the lungs 2 (two) times daily.    Dispense:  10.2 g    Refill:  6   hydrochlorothiazide  (HYDRODIURIL ) 25 MG tablet    Sig: Take 1 tablet (25 mg total) by mouth daily.    Dispense:  90 tablet    Refill:  1   omeprazole  (PRILOSEC) 40 MG capsule    Sig: Take 1 capsule (40 mg total) by mouth daily.    Dispense:  90 capsule    Refill:  1   Semaglutide , 1 MG/DOSE, (OZEMPIC , 1 MG/DOSE,) 4 MG/3ML SOPN    Sig: Inject 1 mg as directed once a week.    Dispense:  9 mL    Refill:  1   valsartan  (DIOVAN ) 40 MG tablet    Sig: Take 1 tablet (40 mg total) by mouth daily.    Dispense:  90 tablet    Refill:  1   cetirizine  (ZYRTEC ) 10 MG tablet    Sig: Take 1  tablet (10 mg total) by mouth daily.    Dispense:  60 tablet    Refill:  11   predniSONE  (DELTASONE ) 20 MG tablet    Sig: Take 1 tablet (20 mg total) by mouth daily with breakfast.    Dispense:  5 tablet    Refill:  0   benzonatate  (TESSALON ) 100 MG capsule    Sig: Take 1 capsule (100 mg total) by mouth 3 (three) times daily as needed for cough.    Dispense:  15 capsule    Refill:  0    Follow-up: Return in about 6 months (around 01/20/2025) for Chronic medical conditions.       Corrina Sabin, MD, FAAFP. [facility] [place], [place] [phone]   07/22/2024, 5:05 PM

## 2024-07-23 ENCOUNTER — Ambulatory Visit: Payer: Self-pay | Admitting: Family Medicine

## 2024-07-23 LAB — CMP14+EGFR
ALT: 29 IU/L (ref 0–32)
AST: 16 IU/L (ref 0–40)
Albumin: 4.1 g/dL (ref 3.8–4.9)
Alkaline Phosphatase: 109 IU/L (ref 49–135)
BUN/Creatinine Ratio: 20 (ref 9–23)
BUN: 17 mg/dL (ref 6–24)
Bilirubin Total: 0.6 mg/dL (ref 0.0–1.2)
CO2: 28 mmol/L (ref 20–29)
Calcium: 9.7 mg/dL (ref 8.7–10.2)
Chloride: 100 mmol/L (ref 96–106)
Creatinine, Ser: 0.83 mg/dL (ref 0.57–1.00)
Globulin, Total: 2.8 g/dL (ref 1.5–4.5)
Glucose: 112 mg/dL — ABNORMAL HIGH (ref 70–99)
Potassium: 4.5 mmol/L (ref 3.5–5.2)
Sodium: 143 mmol/L (ref 134–144)
Total Protein: 6.9 g/dL (ref 6.0–8.5)
eGFR: 85 mL/min/1.73 (ref >59)

## 2024-08-14 ENCOUNTER — Other Ambulatory Visit: Payer: Self-pay

## 2024-08-29 ENCOUNTER — Other Ambulatory Visit: Payer: Self-pay

## 2024-08-30 ENCOUNTER — Other Ambulatory Visit: Payer: Self-pay

## 2025-01-20 ENCOUNTER — Ambulatory Visit: Admitting: Family Medicine
